# Patient Record
Sex: Male | Born: 1935 | Race: White | Hispanic: No | Marital: Married | Smoking: Former smoker
Health system: Southern US, Community
[De-identification: ages and names within clinical notes are randomized; demographics above are authoritative.]

## PROBLEM LIST (undated history)

## (undated) DIAGNOSIS — M1990 Unspecified osteoarthritis, unspecified site: Secondary | ICD-10-CM

## (undated) DIAGNOSIS — E1140 Type 2 diabetes mellitus with diabetic neuropathy, unspecified: Secondary | ICD-10-CM

## (undated) DIAGNOSIS — E1144 Type 2 diabetes mellitus with diabetic amyotrophy: Secondary | ICD-10-CM

## (undated) DIAGNOSIS — R011 Cardiac murmur, unspecified: Secondary | ICD-10-CM

## (undated) DIAGNOSIS — C801 Malignant (primary) neoplasm, unspecified: Secondary | ICD-10-CM

## (undated) DIAGNOSIS — G4730 Sleep apnea, unspecified: Secondary | ICD-10-CM

## (undated) DIAGNOSIS — M549 Dorsalgia, unspecified: Secondary | ICD-10-CM

## (undated) DIAGNOSIS — I10 Essential (primary) hypertension: Secondary | ICD-10-CM

## (undated) DIAGNOSIS — R2689 Other abnormalities of gait and mobility: Secondary | ICD-10-CM

## (undated) DIAGNOSIS — Z741 Need for assistance with personal care: Secondary | ICD-10-CM

## (undated) HISTORY — DX: Type 2 diabetes mellitus with diabetic neuropathy, unspecified: E11.40

## (undated) HISTORY — PX: VASECTOMY: SHX75

## (undated) HISTORY — DX: Dorsalgia, unspecified: M54.9

## (undated) HISTORY — PX: PROSTATE SURGERY: SHX751

## (undated) HISTORY — PX: CATARACT EXTRACTION: SUR2

## (undated) HISTORY — PX: CHOLECYSTECTOMY: SHX55

## (undated) HISTORY — DX: Type 2 diabetes mellitus with diabetic amyotrophy: E11.44

## (undated) HISTORY — PX: OTHER SURGICAL HISTORY: SHX169

---

## 2001-01-01 ENCOUNTER — Encounter: Payer: Self-pay | Admitting: Neurosurgery

## 2001-01-01 ENCOUNTER — Encounter: Admission: RE | Admit: 2001-01-01 | Discharge: 2001-01-01 | Payer: Self-pay | Admitting: Neurosurgery

## 2001-01-28 ENCOUNTER — Encounter: Payer: Self-pay | Admitting: Neurosurgery

## 2001-02-01 ENCOUNTER — Inpatient Hospital Stay (HOSPITAL_COMMUNITY): Admission: RE | Admit: 2001-02-01 | Discharge: 2001-02-03 | Payer: Self-pay | Admitting: Neurosurgery

## 2001-02-01 ENCOUNTER — Encounter: Payer: Self-pay | Admitting: Neurosurgery

## 2001-04-04 ENCOUNTER — Encounter (HOSPITAL_COMMUNITY): Admission: RE | Admit: 2001-04-04 | Discharge: 2001-05-04 | Payer: Self-pay | Admitting: Neurosurgery

## 2001-05-09 ENCOUNTER — Encounter (HOSPITAL_COMMUNITY): Admission: RE | Admit: 2001-05-09 | Discharge: 2001-06-08 | Payer: Self-pay | Admitting: Neurosurgery

## 2001-08-02 ENCOUNTER — Encounter: Admission: RE | Admit: 2001-08-02 | Discharge: 2001-08-02 | Payer: Self-pay | Admitting: Neurosurgery

## 2001-08-02 ENCOUNTER — Encounter: Payer: Self-pay | Admitting: Neurosurgery

## 2001-12-19 ENCOUNTER — Encounter: Payer: Self-pay | Admitting: Emergency Medicine

## 2001-12-19 ENCOUNTER — Emergency Department (HOSPITAL_COMMUNITY): Admission: EM | Admit: 2001-12-19 | Discharge: 2001-12-19 | Payer: Self-pay | Admitting: *Deleted

## 2002-04-03 ENCOUNTER — Encounter: Payer: Self-pay | Admitting: Neurosurgery

## 2002-04-03 ENCOUNTER — Encounter: Admission: RE | Admit: 2002-04-03 | Discharge: 2002-04-03 | Payer: Self-pay | Admitting: Neurosurgery

## 2002-04-17 ENCOUNTER — Encounter: Admission: RE | Admit: 2002-04-17 | Discharge: 2002-04-17 | Payer: Self-pay | Admitting: Neurosurgery

## 2002-04-17 ENCOUNTER — Encounter: Payer: Self-pay | Admitting: Neurosurgery

## 2002-05-02 ENCOUNTER — Encounter: Admission: RE | Admit: 2002-05-02 | Discharge: 2002-05-02 | Payer: Self-pay | Admitting: Neurosurgery

## 2002-05-02 ENCOUNTER — Encounter: Payer: Self-pay | Admitting: Neurosurgery

## 2003-01-05 ENCOUNTER — Encounter: Payer: Self-pay | Admitting: Internal Medicine

## 2003-01-05 ENCOUNTER — Ambulatory Visit (HOSPITAL_COMMUNITY): Admission: RE | Admit: 2003-01-05 | Discharge: 2003-01-05 | Payer: Self-pay | Admitting: Internal Medicine

## 2003-12-19 ENCOUNTER — Ambulatory Visit (HOSPITAL_COMMUNITY): Admission: RE | Admit: 2003-12-19 | Discharge: 2003-12-19 | Payer: Self-pay | Admitting: Internal Medicine

## 2004-03-31 ENCOUNTER — Ambulatory Visit (HOSPITAL_COMMUNITY): Admission: RE | Admit: 2004-03-31 | Discharge: 2004-03-31 | Payer: Self-pay | Admitting: Otolaryngology

## 2004-05-13 ENCOUNTER — Ambulatory Visit (HOSPITAL_COMMUNITY): Admission: RE | Admit: 2004-05-13 | Discharge: 2004-05-13 | Payer: Self-pay | Admitting: Urology

## 2004-07-03 ENCOUNTER — Ambulatory Visit (HOSPITAL_COMMUNITY): Admission: RE | Admit: 2004-07-03 | Discharge: 2004-07-03 | Payer: Self-pay | Admitting: Internal Medicine

## 2004-07-04 ENCOUNTER — Ambulatory Visit (HOSPITAL_COMMUNITY): Admission: RE | Admit: 2004-07-04 | Discharge: 2004-07-04 | Payer: Self-pay | Admitting: Internal Medicine

## 2004-11-06 ENCOUNTER — Ambulatory Visit (HOSPITAL_COMMUNITY): Admission: RE | Admit: 2004-11-06 | Discharge: 2004-11-06 | Payer: Self-pay | Admitting: Internal Medicine

## 2006-01-18 ENCOUNTER — Other Ambulatory Visit: Admission: RE | Admit: 2006-01-18 | Discharge: 2006-01-18 | Payer: Self-pay | Admitting: Urology

## 2006-02-24 ENCOUNTER — Ambulatory Visit: Admission: RE | Admit: 2006-02-24 | Discharge: 2006-05-25 | Payer: Self-pay | Admitting: Radiation Oncology

## 2006-02-25 ENCOUNTER — Encounter: Admission: RE | Admit: 2006-02-25 | Discharge: 2006-02-25 | Payer: Self-pay | Admitting: Urology

## 2006-04-19 ENCOUNTER — Ambulatory Visit (HOSPITAL_BASED_OUTPATIENT_CLINIC_OR_DEPARTMENT_OTHER): Admission: RE | Admit: 2006-04-19 | Discharge: 2006-04-19 | Payer: Self-pay | Admitting: Urology

## 2007-07-29 ENCOUNTER — Emergency Department (HOSPITAL_COMMUNITY): Admission: EM | Admit: 2007-07-29 | Discharge: 2007-07-29 | Payer: Self-pay | Admitting: Emergency Medicine

## 2007-11-14 ENCOUNTER — Ambulatory Visit (HOSPITAL_COMMUNITY): Admission: RE | Admit: 2007-11-14 | Discharge: 2007-11-14 | Payer: Self-pay | Admitting: Urology

## 2008-04-25 ENCOUNTER — Inpatient Hospital Stay (HOSPITAL_COMMUNITY): Admission: RE | Admit: 2008-04-25 | Discharge: 2008-04-27 | Payer: Self-pay | Admitting: Urology

## 2008-04-25 ENCOUNTER — Encounter (INDEPENDENT_AMBULATORY_CARE_PROVIDER_SITE_OTHER): Payer: Self-pay | Admitting: Urology

## 2009-03-12 ENCOUNTER — Emergency Department (HOSPITAL_COMMUNITY): Admission: EM | Admit: 2009-03-12 | Discharge: 2009-03-12 | Payer: Self-pay | Admitting: Emergency Medicine

## 2010-07-17 ENCOUNTER — Ambulatory Visit: Payer: Self-pay | Admitting: Psychology

## 2010-12-28 HISTORY — PX: TRANSURETHRAL RESECTION OF PROSTATE: SHX73

## 2011-01-17 ENCOUNTER — Emergency Department (HOSPITAL_COMMUNITY)
Admission: EM | Admit: 2011-01-17 | Discharge: 2011-01-17 | Payer: Self-pay | Source: Home / Self Care | Admitting: Emergency Medicine

## 2011-01-20 LAB — URINALYSIS, ROUTINE W REFLEX MICROSCOPIC
Bilirubin Urine: NEGATIVE
Ketones, ur: NEGATIVE mg/dL
Leukocytes, UA: NEGATIVE
Nitrite: NEGATIVE
Protein, ur: 30 mg/dL — AB
Specific Gravity, Urine: 1.02 (ref 1.005–1.030)
Urine Glucose, Fasting: 250 mg/dL — AB
Urobilinogen, UA: 0.2 mg/dL (ref 0.0–1.0)
pH: 7 (ref 5.0–8.0)

## 2011-01-20 LAB — DIFFERENTIAL
Basophils Absolute: 0 10*3/uL (ref 0.0–0.1)
Basophils Relative: 1 % (ref 0–1)
Eosinophils Absolute: 0.3 10*3/uL (ref 0.0–0.7)
Eosinophils Relative: 6 % — ABNORMAL HIGH (ref 0–5)
Lymphocytes Relative: 31 % (ref 12–46)
Lymphs Abs: 1.3 10*3/uL (ref 0.7–4.0)
Monocytes Absolute: 0.4 10*3/uL (ref 0.1–1.0)
Monocytes Relative: 11 % (ref 3–12)
Neutro Abs: 2.1 10*3/uL (ref 1.7–7.7)
Neutrophils Relative %: 51 % (ref 43–77)

## 2011-01-20 LAB — CBC
HCT: 38 % — ABNORMAL LOW (ref 39.0–52.0)
Hemoglobin: 13 g/dL (ref 13.0–17.0)
MCH: 30.9 pg (ref 26.0–34.0)
MCHC: 34.2 g/dL (ref 30.0–36.0)
MCV: 90.3 fL (ref 78.0–100.0)
Platelets: 209 10*3/uL (ref 150–400)
RBC: 4.21 MIL/uL — ABNORMAL LOW (ref 4.22–5.81)
RDW: 14 % (ref 11.5–15.5)
WBC: 4.1 10*3/uL (ref 4.0–10.5)

## 2011-01-20 LAB — BASIC METABOLIC PANEL
BUN: 25 mg/dL — ABNORMAL HIGH (ref 6–23)
CO2: 24 mEq/L (ref 19–32)
Calcium: 9.2 mg/dL (ref 8.4–10.5)
Chloride: 99 mEq/L (ref 96–112)
Creatinine, Ser: 1.99 mg/dL — ABNORMAL HIGH (ref 0.4–1.5)
GFR calc Af Amer: 40 mL/min — ABNORMAL LOW (ref >60)
GFR calc non Af Amer: 33 mL/min — ABNORMAL LOW (ref >60)
Glucose, Bld: 141 mg/dL — ABNORMAL HIGH (ref 70–99)
Potassium: 4.1 mEq/L (ref 3.5–5.1)
Sodium: 133 mEq/L — ABNORMAL LOW (ref 135–145)

## 2011-01-20 LAB — URINE MICROSCOPIC-ADD ON

## 2011-01-21 LAB — URINE CULTURE
Colony Count: NO GROWTH
Culture  Setup Time: 201201222122
Culture: NO GROWTH

## 2011-04-09 LAB — URINALYSIS, ROUTINE W REFLEX MICROSCOPIC
Bilirubin Urine: NEGATIVE
Glucose, UA: NEGATIVE mg/dL
Ketones, ur: NEGATIVE mg/dL
Leukocytes, UA: NEGATIVE
Nitrite: NEGATIVE
Protein, ur: NEGATIVE mg/dL
Specific Gravity, Urine: 1.015 (ref 1.005–1.030)
Urobilinogen, UA: 0.2 mg/dL (ref 0.0–1.0)
pH: 5.5 (ref 5.0–8.0)

## 2011-04-09 LAB — BASIC METABOLIC PANEL
BUN: 27 mg/dL — ABNORMAL HIGH (ref 6–23)
CO2: 28 mEq/L (ref 19–32)
Calcium: 9.4 mg/dL (ref 8.4–10.5)
Chloride: 107 mEq/L (ref 96–112)
Creatinine, Ser: 1.64 mg/dL — ABNORMAL HIGH (ref 0.4–1.5)
GFR calc Af Amer: 50 mL/min — ABNORMAL LOW (ref >60)
GFR calc non Af Amer: 42 mL/min — ABNORMAL LOW (ref >60)
Glucose, Bld: 141 mg/dL — ABNORMAL HIGH (ref 70–99)
Potassium: 5.2 mEq/L — ABNORMAL HIGH (ref 3.5–5.1)
Sodium: 141 mEq/L (ref 135–145)

## 2011-04-09 LAB — URINE MICROSCOPIC-ADD ON

## 2011-05-12 NOTE — H&P (Signed)
NAME:  Manuel [name], Manuel [name]                 ACCOUNT NO.:  [id number]   MEDICAL RECORD NO.:  [id number]          PATIENT TYPE:  AMB   LOCATION:  DAY                           FACILITY:  [facility]   PHYSICIAN:  Ky Barban, M.D.DATE OF BIRTH:  January 30, 1935   DATE OF ADMISSION:  04/24/2008  DATE OF DISCHARGE:  LH                              HISTORY & PHYSICAL   CHIEF COMPLAINT:  Significant symptoms of prostatism.   HISTORY OF PRESENT ILLNESS:  A 76 year old gentleman who has prostate  cancer, had seed implants done in April 2007.  Has done well, but over  the years he has developed symptoms of prostatism.  His flow rate is  average, flow rate is 5 mL per second.  He has been on Flomax, but there  is really not significant improvement.  He is quite miserable, wanted me  to go ahead and do something for that.  I have discussed with Dr.  Dayton Scrape, Radiotherapy, as he told me that he is almost two years post  radiotherapy and seed implant.  He should not have any and radiation  hazard.  I had cystoscope done of the small prostate, so I recommended  that we do a holmium laser ablation of his prostate.  I may have to do  with TUR prostate also at the same time.  I explained the procedure  limitations, complications in more detail.  He understands and wanted me  to go ahead and schedule.   PAST MEDICAL HISTORY:  1. It should be mentioned that he has been on Hytrin for his BPH for      long time.  I added Flomax, even both of them combined did not help      him.  2. Hypertension.  3. Non-insulin dependent diabetes.  4. History of kidney stones.   PAST SURGICAL HISTORY:  1. Surgery on his back in 2002.  2. Cholecystectomy in 1998.  3. Vasectomy in 1976.   FAMILY HISTORY:  His father had prostate cancer, but he died of heart  attack age 3.  Mother died of heart disease at age 87.  One sibling  died of lung cancer at age 71.   PERSONAL HISTORY:  He quit smoking 20 years ago he smoked about a  pack  for 20 years.  Does not drink alcohol.   REVIEW OF SYSTEMS:  Unremarkable.   PHYSICAL EXAMINATION:  VITAL SIGNS:  Blood pressure is 160/80.  CENTRAL NERVOUS SYSTEM:  No gross neurological deficit.  HEENT:  Negative.  CHEST:  Symmetrical.  HEART:  Regular sinus rhythm, no murmur.  ABDOMEN:  Soft, flat.  Liver, spleen, kidneys not palpable.  EXTERNAL GENITALIA:  Circumcised.  Meatus adequate.  Testicles are  normal.  RECTAL:  Normal sphincter tone.  No rectal mass.  Prostate 1-1/2+,  smooth and firm.   IMPRESSION:  1. Prostate cancer with bladder neck obstruction.  2. Non-insulin dependent diabetes.  3. Hypertension.   IMPRESSION:  Prostate cancer with bladder neck obstruction.   PLAN:  Holmium laser ablation of the prostate under anesthesia, then  keep him for  observation.  Admit him to the hospital.      Ky Barban, M.D.  Electronically Signed     MIJ/MEDQ  D:  04/24/2008  T:  04/25/2008  Job:  [id number]

## 2011-05-12 NOTE — Op Note (Signed)
NAME:  Manuel [name], Manuel [name]                 ACCOUNT NO.:  [id number]   MEDICAL RECORD NO.:  [id number]          PATIENT TYPE:  INP   LOCATION:  A312                          FACILITY:  [facility]   PHYSICIAN:  Ky Barban, M.D.DATE OF BIRTH:  10/14/1935   DATE OF PROCEDURE:  04/25/2008  DATE OF DISCHARGE:                               OPERATIVE REPORT   PREOPERATIVE DIAGNOSIS:  Carcinoma of the prostate with bladder neck  obstruction.   POSTOPERATIVE DIAGNOSIS:  Carcinoma of the prostate with bladder neck  obstruction.   PROCEDURE:  Holmium laser ablation of prostate plus transurethral  resection of prostate .   ANESTHESIA:  Spinal.   PROCEDURE:  The patient was given spinal anesthesia, placed in lithotomy  position, and usual prep and drape.  For laser ablation, I introduced  #26 resectoscope sheath.  Then, laser bridge was introduced and through  that, side-firing laser fiber was introduced.  The bladder was  thoroughly inspected.  He had a very fibrotic bladder neck and median  bar.  With the laser, I was able to ablate the bladder neck  circumferentially and also the lateral lobes, but there were lot of  irregular tissues still obstructing.  It was difficult to see the  obstructing tissue, so I decided to resect it so that it is smoothened  out.  The laser scope was removed, and I introduced #28 Iglesias  resectoscope in a typical fashion.  The bladder neck also was  circumferentially resected.  Now the bladder neck was smooth.  The  resectoscope goes in and outside the bladder neck without any  obstruction.  The lateral lobes of the obstructing tissue was resected,  especially near the apex.  There were lot of stones in the prostate near  the apex on the right side, which were renucleated from the prostatic  tissue.  Some of the radioactive seeds, which he had, hopefully they are  not radioactive any more, and they came out.  At the end, the prostatic  urethra and the bladder neck  were wide open.  Chips were evacuated.  Bleeders were coagulated.  Resectoscope was removed and #22 three-way  Foley catheter was left in for drainage.  Seepage started, which was  cleared.  The patient left the operating room in satisfactory condition.      Ky Barban, M.D.  Electronically Signed     MIJ/MEDQ  D:  04/25/2008  T:  04/26/2008  Job:  [id number]

## 2011-05-15 NOTE — H&P (Signed)
[place]. [facility]  Patient:    Manuel [name], Manuel [name]                    MRN: [id number] Adm. Date:  02/01/01 Attending:  Payton Doughty, M.D.                         History and Physical  ADMITTING DIAGNOSIS:  Herniated disk L4-5 extraforaminally on the left side.  SERVICE:  Neurosurgery.  HISTORY OF PRESENT ILLNESS:  This is a 76 year old right-handed white gentleman whom I had seen for 2-3 disk that was managed with epidural steroids in 1998.  He did well.  I then saw him earlier this month and he had pain in his back, down his left hip, and down the anterior aspect of his left knee. He has had a lot of discomfort down his knee.  Medrol Dosepack helped his pain but he has weakness and buckling of the left knee.  MRI showed an extraforaminal disk eccentric to the left at 4-5.  He did not want to do anything, but then called back two days later saying he could not live with the way his knee was and he wanted a diskectomy.  He is now admitted for extraforaminal diskectomy at L4-5 on the left.  MEDICAL HISTORY:  Remarkable for non-insulin-dependent diabetes.  MEDICATIONS: 1. Glucophage 2000 mg a day. 2. Amaryl 1 mg a day. 3. Terazosin 5 mg a day. 4. Neurontin 600 mg at bed. 5. Vicodin p.r.n. basis.  ALLERGIES:  None.  SURGICAL HISTORY:  Remarkable for no operations.  SOCIAL HISTORY:  He does not smoke or drink.  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  FAMILY HISTORY:  Mom and daddy have both died.  Mom had type 1 diabetes. Father passed away of a myocardial infarction two months ago.  REVIEW OF SYSTEMS:  Noncontributory for bladder dysfunction.  He does have back and leg pain.  PHYSICAL EXAMINATION:  HEENT:  Within normal limits.  NECK:  He has good range of motion of his neck.  CHEST:  Clear.  CARDIAC:  Regular rate and rhythm.  ABDOMEN:  Nontender with no hepatosplenomegaly.  EXTREMITIES:  Without  clubbing or cyanosis.  GENITOURINARY:  Deferred.  PERIPHERAL PULSES:  Good.  NEUROLOGIC:  He is awake, alert, oriented.  His cranial nerves are intact. Motor exam shows 5/5 strength throughout the upper and lower extremities save for the knee extensors of the left knee which are now 3/5.  He has an absent knee jerk on the left.  Dorsiflexors are full strength.  Plantar flexors are full strength.  Ankle jerk is full.  CLINICAL IMPRESSION:  Left L4 radiculopathy secondary to extraforaminal disk at 4-5 on the left side.  PLAN:  Lumbar laminectomy/diskectomy.  The risks and benefits of this approach have been discussed with him and he wished to proceed. DD:  02/01/01 TD:  02/01/01 Job: [id number]

## 2011-05-15 NOTE — Procedures (Signed)
NAME:  Manuel [name], Manuel [name]                           ACCOUNT NO.:  [id number]   MEDICAL RECORD NO.:  [id number]                   PATIENT TYPE:  OUT   LOCATION:  RESP                                 FACILITY:  [facility]   PHYSICIAN:  Edward L. Juanetta Gosling, M.D.             DATE OF BIRTH:  1935/07/31   DATE OF PROCEDURE:  DATE OF DISCHARGE:  07/03/2004                              PULMONARY FUNCTION TEST   SUBJECTIVE:  Spirometry shows a mild ventilatory defect and evidence of air  flow obstruction at the level of the small air ways.  This is consistent  with a 20-pack year smoking history.      ___________________________________________                                            Oneal Deputy Juanetta Gosling, M.D.   ELH/MEDQ  D:  07/07/2004  T:  07/07/2004  Job:  [id number]

## 2011-05-15 NOTE — Op Note (Signed)
NAME:  Manuel [name], Manuel [name]                 ACCOUNT NO.:  [id number]   MEDICAL RECORD NO.:  [id number]          PATIENT TYPE:  AMB   LOCATION:  NESC                         FACILITY:  [facility]   PHYSICIAN:  Lindaann Slough, M.D.  DATE OF BIRTH:  [birth year]   DATE OF PROCEDURE:  04/19/2006  DATE OF DISCHARGE:                                 OPERATIVE REPORT   PREOPERATIVE DIAGNOSES:  Carcinoma of prostate.   POSTOPERATIVE DIAGNOSIS:  Carcinoma of prostate.   PROCEDURE:  I-125 seeds implantation and cystoscopy.   SURGEON:  Dr. Brunilda Payor.   ANESTHESIA:  General.   INDICATIONS:  The patient is a 76 year old male who had a positive prostate  biopsy for adenocarcinoma of prostate. Gleason score is 6. PSA at the time  of diagnosis was 6.78. The patient chosen to have seeds implantation. He is  scheduled today for the procedure.   Under general anesthesia, the patient was prepped and draped and placed in  the dorsolithotomy position. The transducer was then inserted in the rectum  was inserted in the rectum, and the grid was attached to the transducer, and  ultrasound of the prostate was done and ultrasound planning was done by Dr.  Dayton Scrape. Two anchor needles were placed in the prostate. Then, after proper  planning under ultrasound guidance, a total of 23 needles were used to  insert 61 seeds in the prostate. The total radiation dose is 37.071  millicuries.   Then, the Foley catheter that was previously inserted in the bladder was  removed. Then, a flexible cystoscope was inserted in the bladder. The  anterior urethra is normal. He has moderate prostatic hypertrophy. The  bladder mucosa is normal. There is no stone or tumor in the bladder. The  ureteral orifices are in normal position and shape with clear  efflux. There is no evidence of seeds in the bladder. The cystoscope was  then removed. A #16 Foley catheter was then reinserted in the bladder.   The patient tolerated the procedure well and  left the OR in satisfactory  condition to post anesthesia care unit.      Lindaann Slough, M.D.  Electronically Signed     MN/MEDQ  D:  04/19/2006  T:  04/20/2006  Job:  [id number]   cc:   Maryln Gottron, M.D.  Fax: [phone]   Ky Barban, M.D.  Fax: [phone]

## 2011-05-15 NOTE — Op Note (Signed)
[place]. [facility]  Patient:    BARTON, WANT                        MRN: [id number] Proc. Date: 02/01/01 Adm. Date:  [id number] Attending:  Emeterio Reeve                           Operative Report  PREOPERATIVE DIAGNOSIS:  Herniated disk on the left side, extraforminally at L4-5.  POSTOPERATIVE DIAGNOSIS:  Herniated disk on the left side, extraforminally at L4-5.  OPERATIVE PROCEDURE:  L4-5 extraforaminal diskectomy.  SURGEON:  Payton Doughty, M.D.  ASSISTANT:  Tanya Nones. Jeral Fruit, M.D.  ANESTHESIA:  General endotracheal.  PREP:  Shave and prepped, scrubbed with alcohol wipe.  COMPLICATIONS:  None.  DESCRIPTION OF PROCEDURE:  This is a 76 year old right-handed, white gentleman with a left L4 radiculopathy secondary to a herniated disk in the foramen at L4-5.  He was taken to the operating room, smoothly anesthetized, intubated and placed prone on the operating table.  Following shave, prepped and draped in usual sterile fashion.  Skin was infiltrated with 1% lidocaine with 1:400,000 epinephrine.  Skin was incised from the bottom of L3 to the bottom of L5 and the lamina of L4 and L5 were exposed on the left side out over the transverse process of both L4 and L5.  Intraoperative x-ray confirmed correctness of level.  The marker was under L5.  Working between the transverse processes, muscle was taken down.  The intertransverse membrane was taken down.  Working medially towards the L4 neural foramen, the four root was identified as it rounded the four pedicle and dissected free laterally over the 4-5 disk space.  The nerve was dissected free and underneath it was immediately demonstrated a large free fragment of disk, which was removed without difficulty. Having decompressed the nerve root, it was carefully explored in all quadrants and found to be free.  The disk space was not explored because of the tightness of the nerve passing over and concerns  about compression on the nerve.  The wound was irrigated and the nerve root covered with Depo Medrol-soaked fat.  The fascia was reapproximated with 0 Vicryl in interrupted fashion, subcutaneous tissues were reapproximated with 0 Vicryl in interrupted fashion, the subcuticular tissues were reapproximated with 3-0 Vicryl interrupted fashion and the skin was closed with 3-0 nylon in a running lock fashion.  Betadine, Telfa dressing was applied and made occlusive with OpSite.  The patient returned recovery room in good condition. DD:  02/01/01 TD:  02/01/01 Job: [id number]

## 2011-05-15 NOTE — Discharge Summary (Signed)
NAME:  CHI, GARLOW                 ACCOUNT NO.:  [id number]   MEDICAL RECORD NO.:  [id number]          PATIENT TYPE:  INP   LOCATION:  A312                          FACILITY:  [facility]   PHYSICIAN:  Ky Barban, M.D.DATE OF BIRTH:  1935-09-09   DATE OF ADMISSION:  04/24/2008  DATE OF DISCHARGE:  05/01/2009LH                               DISCHARGE SUMMARY   HISTORY OF PRESENT ILLNESS:  A 76 year old gentleman has a longstanding  history of prostatism and he had prostate cancer and underwent  radioactive seed implant done 2 years ago.  Workup shows that he has a  bladder neck obstruction.  He was not responding to Flomax, so I advised  him to undergo TUR of prostate.  His other medical problems include  hypertension, diabetes non-insulin dependent, and history of kidney  stones.  He had surgery on his back in 2002, cholecystectomy in 1998,  and vasectomy in 1996.  His father had prostate cancer, died of heart  attack at age 65.  This gentleman was brought to the outpatient.  CBC,  urinalysis, SMA-7 is normal.  He was taken to the operating room.  TUR  of prostate is done.  Postoperative course is benign.  First  postoperative day, urine is clear.  CBI was discontinued, and he was  started on regular diet.  On second postoperative day, urine is clear,  discontinued his Foley catheter, he is voiding fine.  He is being  discharged.  He will be followed by me in the office.   DISCHARGE DIAGNOSIS:  Prostate cancer.   DISCHARGE CONDITION:  Improved.   DISCHARGE MEDICATIONS:  He is advised to continue his usual medicines.  I will see him back in 2 weeks in the office.      Ky Barban, M.D.  Electronically Signed     MIJ/MEDQ  D:  05/18/2008  T:  05/19/2008  Job:  [id number]

## 2011-05-15 NOTE — Op Note (Signed)
NAME:  Manuel [name], Manuel [name]                           ACCOUNT NO.:  [id number]   MEDICAL RECORD NO.:  [id number]                   PATIENT TYPE:  AMB   LOCATION:  DAY                                  FACILITY:  [facility]   PHYSICIAN:  Lionel December, M.D.                 DATE OF BIRTH:  Dec 08, 1935   DATE OF PROCEDURE:  12/19/2003  DATE OF DISCHARGE:                                 OPERATIVE REPORT   PROCEDURE:  Total colonoscopy with polypectomy.   ENDOSCOPIST:  Lionel December, M.D.   INDICATIONS:  Skip is a 76 year old Caucasian male who is undergoing  screening colonoscopy.  Family history is negative for colorectal carcinoma.   The procedure and risks were reviewed with the patient and informed consent  was obtained.   PREOPERATIVE MEDICATIONS:  Demerol 50 mg IV and Versed 6 mg IV in divided  dose.   FINDINGS:  Procedure performed in endoscopy suite.  The patient's vital  signs and O2 saturation were monitored during the procedure and remained  stable.  The patient was placed in the left lateral recumbent position and  rectal examination was performed.  No abnormality noted on external or  digital exam.   Olympus videoscope was placed in the rectum and advanced under vision into  the sigmoid colon and beyond. Scope was passed to the cecum which was  identified by ileocecal valve and appendiceal orifice. A picture of the IL  was taken but I did not take a picture of the blunt end, but it was well  seen.  As the scope was withdrawn the colonic mucosa was carefully examined.  There were a few small diverticula at the sigmoid colon.  There was a large  pedunculated polyp at the sigmoid colon on a fairly long stalk which was  about 2 cm long.  The polyp size was 15-20 mm.  This was snared and  retrieved for histologic examination.  There was another small polyp at the  rectosigmoid junction at about 6-7 mm. This was snared and retrieved for  histological examination.  Two tiny polyps were  noted in the rectum which  were coagulated and completely ablated.  The scope was retroflexed to  examine the anorectal junction and small hemorrhoids were noted below the  dentate line.   The endoscope was straightened and withdrawn.  The patient tolerated the  procedure well.   FINAL DIAGNOSES:  1. Two polyps snared, a large pedunculated polyp was at sigmoid colon and a     smaller polyp at the rectosigmoid junction.  Two more tiny polyps at the     rectum were coagulated.  2. A few diverticula at the sigmoid colon and small external hemorrhoids.   RECOMMENDATIONS:  1. Standard instructions given.  2. High fiber diet.  3. I will be contacting the patient with biopsy results.  Given today's     findings, I feel, he  should return for repeat colonoscopy 5 years from     now.      ___________________________________________                                            Lionel December, M.D.   NR/MEDQ  D:  12/19/2003  T:  12/20/2003  Job:  [id number]   cc:   Kingsley Callander. Ouida Sills, M.D.  [address]  [place]  [place] [address]  Fax: [phone]

## 2011-06-24 ENCOUNTER — Other Ambulatory Visit (HOSPITAL_COMMUNITY): Payer: Self-pay | Admitting: Internal Medicine

## 2011-06-24 DIAGNOSIS — R0989 Other specified symptoms and signs involving the circulatory and respiratory systems: Secondary | ICD-10-CM

## 2011-06-26 ENCOUNTER — Ambulatory Visit (HOSPITAL_COMMUNITY)
Admission: RE | Admit: 2011-06-26 | Discharge: 2011-06-26 | Disposition: A | Discharge status: Home / Self Care | Payer: Medicare Other | Source: Ambulatory Visit | Attending: Internal Medicine | Admitting: Internal Medicine

## 2011-06-26 DIAGNOSIS — R0989 Other specified symptoms and signs involving the circulatory and respiratory systems: Secondary | ICD-10-CM

## 2011-06-26 DIAGNOSIS — E119 Type 2 diabetes mellitus without complications: Secondary | ICD-10-CM | POA: Insufficient documentation

## 2011-06-26 DIAGNOSIS — I6529 Occlusion and stenosis of unspecified carotid artery: Secondary | ICD-10-CM | POA: Insufficient documentation

## 2011-07-03 ENCOUNTER — Other Ambulatory Visit: Payer: Self-pay | Admitting: Neurosurgery

## 2011-07-03 DIAGNOSIS — M545 Low back pain, unspecified: Secondary | ICD-10-CM

## 2011-07-13 ENCOUNTER — Ambulatory Visit
Admission: RE | Admit: 2011-07-13 | Discharge: 2011-07-13 | Disposition: A | Discharge status: Home / Self Care | Payer: Medicare Other | Source: Ambulatory Visit | Attending: Neurosurgery | Admitting: Neurosurgery

## 2011-07-13 DIAGNOSIS — M545 Low back pain, unspecified: Secondary | ICD-10-CM

## 2011-07-17 ENCOUNTER — Other Ambulatory Visit: Payer: Self-pay | Admitting: Neurosurgery

## 2011-07-17 DIAGNOSIS — M79604 Pain in right leg: Secondary | ICD-10-CM

## 2011-07-17 DIAGNOSIS — M545 Low back pain: Secondary | ICD-10-CM

## 2011-07-17 DIAGNOSIS — M47816 Spondylosis without myelopathy or radiculopathy, lumbar region: Secondary | ICD-10-CM

## 2011-07-17 DIAGNOSIS — M25552 Pain in left hip: Secondary | ICD-10-CM

## 2011-07-20 ENCOUNTER — Other Ambulatory Visit: Payer: Medicare Other

## 2011-07-20 ENCOUNTER — Ambulatory Visit
Admission: RE | Admit: 2011-07-20 | Discharge: 2011-07-20 | Disposition: A | Discharge status: Home / Self Care | Payer: Medicare Other | Source: Ambulatory Visit | Attending: Neurosurgery | Admitting: Neurosurgery

## 2011-07-20 DIAGNOSIS — M79604 Pain in right leg: Secondary | ICD-10-CM

## 2011-07-20 DIAGNOSIS — M47816 Spondylosis without myelopathy or radiculopathy, lumbar region: Secondary | ICD-10-CM

## 2011-07-20 DIAGNOSIS — M545 Low back pain: Secondary | ICD-10-CM

## 2011-07-20 DIAGNOSIS — M25552 Pain in left hip: Secondary | ICD-10-CM

## 2011-07-20 MED ORDER — IOHEXOL 180 MG/ML  SOLN
1.0000 mL | Freq: Once | INTRAMUSCULAR | Status: AC | PRN
  Administered 2011-07-20: 1 mL via EPIDURAL

## 2011-07-20 MED ORDER — METHYLPREDNISOLONE ACETATE 40 MG/ML INJ SUSP (RADIOLOG
120.0000 mg | Freq: Once | INTRAMUSCULAR | Status: AC
  Administered 2011-07-20: 120 mg via EPIDURAL

## 2011-08-03 ENCOUNTER — Other Ambulatory Visit: Payer: Self-pay

## 2011-08-03 ENCOUNTER — Encounter (HOSPITAL_COMMUNITY): Payer: Self-pay

## 2011-08-03 ENCOUNTER — Inpatient Hospital Stay (HOSPITAL_COMMUNITY)
Admission: AD | Admit: 2011-08-03 | Discharge: 2011-08-08 | DRG: 683 | Disposition: A | Discharge status: Home / Self Care | Payer: Medicare Other | Source: Ambulatory Visit | Attending: Internal Medicine | Admitting: Internal Medicine

## 2011-08-03 ENCOUNTER — Inpatient Hospital Stay (HOSPITAL_COMMUNITY): Payer: Medicare Other

## 2011-08-03 DIAGNOSIS — E119 Type 2 diabetes mellitus without complications: Secondary | ICD-10-CM | POA: Diagnosis present

## 2011-08-03 DIAGNOSIS — N390 Urinary tract infection, site not specified: Secondary | ICD-10-CM | POA: Diagnosis present

## 2011-08-03 DIAGNOSIS — E875 Hyperkalemia: Secondary | ICD-10-CM | POA: Diagnosis present

## 2011-08-03 DIAGNOSIS — N179 Acute kidney failure, unspecified: Secondary | ICD-10-CM | POA: Diagnosis present

## 2011-08-03 DIAGNOSIS — E785 Hyperlipidemia, unspecified: Secondary | ICD-10-CM | POA: Diagnosis present

## 2011-08-03 DIAGNOSIS — N400 Enlarged prostate without lower urinary tract symptoms: Secondary | ICD-10-CM | POA: Diagnosis present

## 2011-08-03 DIAGNOSIS — I10 Essential (primary) hypertension: Secondary | ICD-10-CM | POA: Diagnosis present

## 2011-08-03 DIAGNOSIS — E118 Type 2 diabetes mellitus with unspecified complications: Secondary | ICD-10-CM | POA: Diagnosis present

## 2011-08-03 DIAGNOSIS — L255 Unspecified contact dermatitis due to plants, except food: Secondary | ICD-10-CM | POA: Diagnosis present

## 2011-08-03 DIAGNOSIS — N183 Chronic kidney disease, stage 3 unspecified: Secondary | ICD-10-CM | POA: Diagnosis present

## 2011-08-03 DIAGNOSIS — N189 Chronic kidney disease, unspecified: Secondary | ICD-10-CM | POA: Diagnosis present

## 2011-08-03 DIAGNOSIS — E872 Acidosis, unspecified: Secondary | ICD-10-CM | POA: Diagnosis present

## 2011-08-03 DIAGNOSIS — M519 Unspecified thoracic, thoracolumbar and lumbosacral intervertebral disc disorder: Secondary | ICD-10-CM | POA: Diagnosis present

## 2011-08-03 DIAGNOSIS — I129 Hypertensive chronic kidney disease with stage 1 through stage 4 chronic kidney disease, or unspecified chronic kidney disease: Secondary | ICD-10-CM | POA: Diagnosis present

## 2011-08-03 DIAGNOSIS — C61 Malignant neoplasm of prostate: Secondary | ICD-10-CM | POA: Diagnosis present

## 2011-08-03 DIAGNOSIS — T622X1A Toxic effect of other ingested (parts of) plant(s), accidental (unintentional), initial encounter: Secondary | ICD-10-CM | POA: Diagnosis present

## 2011-08-03 HISTORY — DX: Malignant (primary) neoplasm, unspecified: C80.1

## 2011-08-03 HISTORY — DX: Essential (primary) hypertension: I10

## 2011-08-03 MED ORDER — INSULIN ASPART 100 UNIT/ML ~~LOC~~ SOLN: 0.0000 [IU] | Freq: Every day | SUBCUTANEOUS | Status: DC

## 2011-08-03 MED ORDER — ACETAMINOPHEN 325 MG PO TABS: 650.0000 mg | ORAL_TABLET | Freq: Four times a day (QID) | ORAL | Status: DC | PRN

## 2011-08-03 MED ORDER — INSULIN ASPART 100 UNIT/ML ~~LOC~~ SOLN: 0.0000 [IU] | SUBCUTANEOUS | Status: DC

## 2011-08-03 MED ORDER — ACETAMINOPHEN 650 MG RE SUPP: 650.0000 mg | Freq: Four times a day (QID) | RECTAL | Status: DC | PRN

## 2011-08-03 MED ORDER — ONDANSETRON HCL 4 MG/2ML IJ SOLN: 4.0000 mg | Freq: Four times a day (QID) | INTRAMUSCULAR | Status: DC | PRN

## 2011-08-03 MED ORDER — SODIUM CHLORIDE 0.9 % IJ SOLN
INTRAMUSCULAR | Status: AC
  Administered 2011-08-03: 3 mL via INTRAVENOUS
  Filled 2011-08-03: qty 10

## 2011-08-03 MED ORDER — ALUM & MAG HYDROXIDE-SIMETH 200-200-20 MG/5ML PO SUSP: 30.0000 mL | Freq: Four times a day (QID) | ORAL | Status: DC | PRN

## 2011-08-03 MED ORDER — SODIUM CHLORIDE 0.9 % IJ SOLN
3.0000 mL | INTRAMUSCULAR | Status: DC | PRN
  Administered 2011-08-03: 10 mL via INTRAVENOUS

## 2011-08-03 MED ORDER — SODIUM CHLORIDE 0.9 % IV SOLN: 250.0000 mL | INTRAVENOUS | Status: DC

## 2011-08-03 MED ORDER — SODIUM CHLORIDE 0.9 % IJ SOLN
3.0000 mL | Freq: Two times a day (BID) | INTRAMUSCULAR | Status: DC
  Administered 2011-08-03: 3 mL via INTRAVENOUS

## 2011-08-03 MED ORDER — SODIUM POLYSTYRENE SULFONATE 15 GM/60ML PO SUSP
30.0000 g | Freq: Once | ORAL | Status: AC
  Administered 2011-08-03: 30 g via ORAL
  Filled 2011-08-03 (×2): qty 60

## 2011-08-03 MED ORDER — ONDANSETRON HCL 4 MG PO TABS: 4.0000 mg | ORAL_TABLET | Freq: Four times a day (QID) | ORAL | Status: DC | PRN

## 2011-08-03 MED ORDER — SODIUM CHLORIDE 0.9 % IV SOLN
INTRAVENOUS | Status: DC
  Administered 2011-08-03 – 2011-08-04 (×4): via INTRAVENOUS
  Administered 2011-08-05: 950 mL via INTRAVENOUS
  Administered 2011-08-05: 1000 mL via INTRAVENOUS
  Administered 2011-08-05: 950 mL via INTRAVENOUS
  Administered 2011-08-06: 1000 mL via INTRAVENOUS
  Administered 2011-08-06: 175 mL via INTRAVENOUS
  Administered 2011-08-06: 1000 mL via INTRAVENOUS
  Administered 2011-08-07: 125 mL via INTRAVENOUS
  Administered 2011-08-07: 1000 mL via INTRAVENOUS
  Administered 2011-08-07 – 2011-08-08 (×2): via INTRAVENOUS

## 2011-08-03 NOTE — Plan of Care (Signed)
Problem: Phase I Progression Outcomes Goal: Pain controlled with appropriate interventions Outcome: Not Applicable Date Met:  08/03/11 No c/o pain

## 2011-08-04 LAB — GLUCOSE, CAPILLARY
Glucose-Capillary: 94 mg/dL (ref 70–99)
Glucose-Capillary: 66 mg/dL — ABNORMAL LOW (ref 70–99)
Glucose-Capillary: 178 mg/dL — ABNORMAL HIGH (ref 70–99)

## 2011-08-04 LAB — BASIC METABOLIC PANEL
BUN: 90 mg/dL — ABNORMAL HIGH (ref 6–23)
Calcium: 8.8 mg/dL (ref 8.4–10.5)
Creatinine, Ser: 8.33 mg/dL — ABNORMAL HIGH (ref 0.50–1.35)
GFR calc non Af Amer: 6 mL/min — ABNORMAL LOW (ref >60)
Glucose, Bld: 61 mg/dL — ABNORMAL LOW (ref 70–99)

## 2011-08-04 MED ORDER — HYDROXYZINE HCL 25 MG PO TABS
25.0000 mg | ORAL_TABLET | ORAL | Status: DC | PRN
  Administered 2011-08-04 – 2011-08-06 (×2): 25 mg via ORAL
  Filled 2011-08-04 (×2): qty 1

## 2011-08-04 MED ORDER — INSULIN ASPART 100 UNIT/ML ~~LOC~~ SOLN: 0.0000 [IU] | Freq: Every day | SUBCUTANEOUS | Status: DC

## 2011-08-04 MED ORDER — INSULIN ASPART 100 UNIT/ML ~~LOC~~ SOLN: 0.0000 [IU] | SUBCUTANEOUS | Status: DC

## 2011-08-04 MED ORDER — INSULIN ASPART 100 UNIT/ML ~~LOC~~ SOLN: 0.0000 [IU] | Freq: Three times a day (TID) | SUBCUTANEOUS | Status: DC

## 2011-08-04 MED ORDER — INSULIN ASPART 100 UNIT/ML ~~LOC~~ SOLN
0.0000 [IU] | Freq: Three times a day (TID) | SUBCUTANEOUS | Status: DC
  Administered 2011-08-05 – 2011-08-06 (×2): 3 [IU] via SUBCUTANEOUS
  Administered 2011-08-07: 2 [IU] via SUBCUTANEOUS
  Administered 2011-08-07: 3 [IU] via SUBCUTANEOUS
  Filled 2011-08-04: qty 3

## 2011-08-04 MED ORDER — INSULIN ASPART 100 UNIT/ML ~~LOC~~ SOLN: 1.0000 [IU] | Freq: Three times a day (TID) | SUBCUTANEOUS | Status: DC

## 2011-08-04 MED ORDER — FUROSEMIDE 10 MG/ML IJ SOLN
100.0000 mg | Freq: Two times a day (BID) | INTRAVENOUS | Status: DC
  Administered 2011-08-04 – 2011-08-05 (×2): 100 mg via INTRAVENOUS
  Filled 2011-08-04 (×4): qty 10

## 2011-08-04 NOTE — Progress Notes (Signed)
NAME:  LIEV, BROCKBANK                 ACCOUNT NO.:  [id number]  MEDICAL RECORD NO.:  [id number]  LOCATION:  A334                          FACILITY:  [facility]  PHYSICIAN:  Kingsley Callander. Ouida Sills, MD       DATE OF BIRTH:  18-Jun-1935  DATE OF PROCEDURE: DATE OF DISCHARGE:                                PROGRESS NOTE   Mr. Cafiero has had no problems overnight.  He was hypoglycemic this morning to 49 on his Accu-Chek.  PHYSICAL EXAMINATION:  VITAL SIGNS:  His temperature is 97.8, pulse 56, respirations 18, blood pressure 103/57. GENERAL:  He is alert and oriented. LUNGS:  Clear. HEART:  Regular with no murmurs. ABDOMEN:  Soft and nontender.  His EKG last night revealed sinus bradycardia with no tall peaked T- waves.  IMPRESSION/PLAN: 1. Acute renal failure.  His BUN and creatinine today are 90 and 8.3     after having been 88 and 8.0 yesterday.  His IV fluid rate will be     increased to 175 mL an hour.  He will be seen in Nephrology     consultation by Dr. Kristian Covey.  We had discussed his case.  He had     no evidence of obstruction on a bladder scan or on a CT scan of the     abdomen and pelvis yesterday.  He did not have evidence of     hydronephrosis.  His CK was elevated yesterday at 525.  A possible     explanation may be related to heat exposure last week.  His CK may     be down from a significant elevation several days ago. 2. Hyperkalemia.  Serum potassium has normalized from 6.6 to 5.1 after     fluids and Kayexalate. 3. Diabetes with hypoglycemia.  His glucose was 61 on his BMET.  He     has been treated with juice.  His oral hypoglycemics are being     held. 4. History of hypertension. 5. Possible urinary tract infection.  Urinalysis reveals 7-10 white     cells and 3-6 RBCs.  His urine culture is pending.     Kingsley Callander. Ouida Sills, MD     ROF/MEDQ  D:  08/04/2011  T:  08/04/2011  Job:  [id number]

## 2011-08-04 NOTE — Consult Note (Signed)
Reason for Consult: Acute kidney injury superimposed on chronic Referring Physician: Dr. Hulan [name] is an 76 y.o. male.  HPI: The patient has history of diabetes for a long period and history of chronic renal failure thought to be secondary to obstructive uropathy with baseline creatinine about 1.8 1.9 Presently the patient came with complaints of weakness and the exertion after working outside. According to the patient on Saturday he started having some weakness of his leg but improved after a while now. The next morning he was still working outside he had a similar episode of weakness feeling tired. Because of that he went to Dr. Ouida [name] and will have blood work was done and that was found to have significantly elevated creatinine creatinine. Patient denies any nausea no vomiting he denies also any shortness of breath orthopnea paroxysmal nocturnal dyspnea. Patient also denies any history of a fever or chills or sweating.  Past Medical History  Diagnosis Date  . Hypertension   . Diabetes mellitus   . Cancer     Past Surgical History  Procedure Date  . Back surgery   . Prostate surgery     with seed implants  . Cholecystectomy     No family history on file.  Social History:  reports that he has never smoked. He does not have any smokeless tobacco history on file. He reports that he does not drink alcohol or use illicit drugs.  Allergies: No Known Allergies  Medications: I have reviewed the patient'[name] current medications.  Results for orders placed during the hospital encounter of 08/03/11 (from the past 48 hour([name]))  GLUCOSE, CAPILLARY     Status: Abnormal   Collection Time   08/03/11  8:12 PM      Component Value Range Comment   Glucose-Capillary 63 (*) 70 - 99 (mg/dL)   GLUCOSE, CAPILLARY     Status: Abnormal   Collection Time   08/03/11 11:46 PM      Component Value Range Comment   Glucose-Capillary 111 (*) 70 - 99 (mg/dL)   GLUCOSE, CAPILLARY     Status: Normal   Collection Time   08/04/11  3:54 AM      Component Value Range Comment   Glucose-Capillary 94  70 - 99 (mg/dL)   BASIC METABOLIC PANEL     Status: Abnormal   Collection Time   08/04/11  5:27 AM      Component Value Range Comment   Sodium 137  135 - 145 (mEq/L)    Potassium 5.1  3.5 - 5.1 (mEq/L)    Chloride 99  96 - 112 (mEq/L)    CO2 19  19 - 32 (mEq/L)    Glucose, Bld 61 (*) 70 - 99 (mg/dL)    BUN 90 (*) 6 - 23 (mg/dL)    Creatinine, Ser 1.61 (*) 0.50 - 1.35 (mg/dL)    Calcium 8.8  8.4 - 10.5 (mg/dL)    GFR calc non Af Amer 6 (*) >60 (mL/min)    GFR calc Af Amer 8 (*) >60 (mL/min)   PHOSPHORUS     Status: Abnormal   Collection Time   08/04/11  5:27 AM      Component Value Range Comment   Phosphorus 6.7 (*) 2.3 - 4.6 (mg/dL)   MAGNESIUM     Status: Normal   Collection Time   08/04/11  5:27 AM      Component Value Range Comment   Magnesium 2.5  1.5 - 2.5 (mg/dL)   GLUCOSE,  CAPILLARY     Status: Abnormal   Collection Time   08/04/11  7:36 AM      Component Value Range Comment   Glucose-Capillary 49 (*) 70 - 99 (mg/dL)   GLUCOSE, CAPILLARY     Status: Normal   Collection Time   08/04/11  8:07 AM      Component Value Range Comment   Glucose-Capillary 83  70 - 99 (mg/dL)     Ct Abdomen Pelvis Wo Contrast  08/03/2011  *RADIOLOGY REPORT*  Clinical Data: Acute renal failure, evaluate for obstruction, history of prostate cancer  CT ABDOMEN AND PELVIS WITHOUT CONTRAST  Technique:  Multidetector CT imaging of the abdomen and pelvis was performed following the standard protocol without intravenous contrast.  Comparison: 11/14/2007  Findings: Calcified pleural plaques at the lung bases.  Unenhanced liver, spleen, pancreas, and adrenal glands are within normal limits.  Status post cholecystectomy.  No intrahepatic or extrahepatic ductal dilatation.  Bilateral renal scarring.  3 mm nonobstructing calculus in the left lower pole (series 3/image 42).  No hydronephrosis.  No evidence of bowel obstruction.   Normal appendix.  Colonic diverticulosis, without associated inflammatory changes.  Atherosclerotic calcifications of the abdominal aorta and branch vessels.  No abdominopelvic ascites.  No suspicious lymphadenopathy.  Prostate is notable for brachytherapy seeds.  Bladder is thick-walled but underdistended.  Degenerative changes of the visualized thoracolumbar spine.  No focal osseous lesions.  IMPRESSION: 3 mm nonobstructing left lower pole calculus.  No hydronephrosis.  Bladder is thick-walled but underdistended.  Brachytherapy seeds in the prostate.  No evidence of metastatic disease in the abdomen/pelvis.  Original Report Authenticated By: Manuel [name], M.D.    Review of Systems  Constitutional: Negative for fever and chills.  Respiratory: Negative for cough.   Cardiovascular: Negative for chest pain, orthopnea and PND.  Gastrointestinal: Negative for nausea, vomiting and abdominal pain.  Genitourinary: Positive for frequency.  Musculoskeletal: Positive for myalgias and joint pain.  Skin: Positive for itching.  Neurological: Positive for tremors.   Blood pressure 103/57, pulse 56, temperature 97.8 F (36.6 C), temperature source Oral, resp. rate 18, height 6' 1.5" (1.867 m), weight 117.663 kg (259 lb 6.4 oz), SpO2 97.00%. Physical Exam  Constitutional: He is oriented to person, place, and time.  Eyes: Conjunctivae are normal. Pupils are equal, round, and reactive to light.  Neck: Neck supple.  Cardiovascular: Normal rate, regular rhythm, normal heart sounds and intact distal pulses.  Exam reveals no gallop.   No murmur heard. Respiratory: No respiratory distress. He has no wheezes. He has no rales.  GI: He exhibits distension. There is no tenderness. There is no rebound.  Musculoskeletal: He exhibits no edema.  Neurological: He is oriented to person, place, and time.  Skin: Rash noted.    Assessment/Plan Problem #1 acute kidney injury her as this moment the possibly her prerenal  syndrome versus ATN her since the patient has history of her muscle pain elevated pending creatinine hyperkalemia her as this moment rhabdomyolysis need to be added into the differential diagnosis however his CPK has seems to be only mildly elevated. Problem #2 Hyperkalemia he status post thekyoxalated presently his potassium has come down to 5.1. Problem #3 history of diabetes Problem number4 for her history of proteinuria in nephrotic range 2 to be secondary to her diabetes patient has been on ACE inhibitoro and a couple of years. Problem #5 a history of prostate ca status post radiation Problem #6 history of obstructive uropathy secondary to BPH with the  present one her function of probably a stage II or early stage III chronic renal failure. Problem #7 history of hypertension and. Recommendation we'll check her history today We'll increase his IV fluids to 150 cc per hour I once the patient is adequately hydrated possibly after he received about 2 L will give him Lasix 100 mg IV. If UA showed significant proteinuria probably would not work up her hypoproteinemia. We'll check her CBC be BMET phosphorus he'[name] the morning. I will follow patient. Is end of consult thank you   Manuel [name] 08/04/2011, 10:30 AM

## 2011-08-04 NOTE — H&P (Signed)
NAME:  Manuel [name], Manuel [name]                 ACCOUNT NO.:  [id number]  MEDICAL RECORD NO.:  [id number]  LOCATION:  A334                          FACILITY:  [facility]  PHYSICIAN:  Kingsley Callander. Ouida Sills, MD       DATE OF BIRTH:  11/07/1935  DATE OF ADMISSION:  08/03/2011 DATE OF DISCHARGE:  LH                             HISTORY & PHYSICAL   CHIEF COMPLAINT:  Generalized weakness and leg cramps.  HISTORY OF PRESENT ILLNESS:  This patient is a 76 year old white male who presented with episodes of weakness and leg cramping over the past week.  He was evaluated in the office today and found to be in acute renal failure with a BUN and creatinine of 88 and 8.0.  His previous creatinine had been in the 1.8-2.0 range.  He is also hyperkalemic at 6.6.  The patient has a history of prostate cancer treated with radioactive seeds and TURP.  He had an episode of urinary retention last January, requiring emergency room visit.  He has recently had difficulty voiding and emptying his bladder effectively.  He denied gross hematuria.  His urinalysis revealed 7-10 white cells and 3-6 red cells. It is also noteworthy that he had 2 episodes last week of significant weakness after trying to work outdoors, he is outdoor in the heat on Saturday for about 5 hours.  He became much weaker then and felt the cramping in his legs and felt some involuntary jerking of his arms.  PAST MEDICAL HISTORY: 1. Prostate cancer, treated with radioactive seeds. 2. History of benign prostatic hypertrophy with urinary retention. 3. Type 2 diabetes. 4. Chronic kidney disease. 5. Microalbuminuria. 6. Hypertension. 7. Lumbar disk disease. 8. History of memory loss.  MEDICATIONS: 1. Duetact 2/30 mg daily. 2. Metformin 1000 mg b.i.d. 3. Enalapril 10 mg daily. 4. Amlodipine 5 mg daily. 5. Gabapentin 600 mg t.i.d. 6. Fenofibrate 160 mg daily. 7. Exelon 3 mg b.i.d. 8. Aspirin 81 mg daily. 9. Fish oil 1200 mg t.i.d.  ALLERGIES:  None.  SOCIAL  HISTORY:  He does not smoke cigarettes, drink alcohol, or use recreational substances.  FAMILY HISTORY:  Remarkable for diabetes in his mother and grandmother.  REVIEW OF SYSTEMS:  He denies fever, chills, syncope, chest pain, difficulty breathing, or vomiting.  Did have 1 loose stool today.  PHYSICAL EXAMINATION:  VITAL SIGNS:  Blood pressure 110/68, pulse 76, weight 250. GENERAL:  Alert, emotional but in no acute distress. HEENT:  Eyes, nose, and oropharynx unremarkable. NECK:  Supple with no JVD or thyromegaly.  He has bilateral carotid bruits and he has recently had an ultrasound which revealed no hemodynamically significant stenosis. LUNGS:  Clear. HEART:  Regular with no murmurs. ABDOMEN:  Soft and nontender with no hepatosplenomegaly.  No CVA tenderness. EXTREMITIES:  No cyanosis, clubbing, or edema. NEUROLOGIC:  He does have some intermittent involuntary jerking of his upper extremities. LYMPH NODES:  No cervical or supraclavicular adenopathy. SKIN:  Warm and dry.  LABORATORY DATA:  White count 54,00, hemoglobin 11.7, with an MCV of 93, platelets 344,000.  Sodium 137, potassium 6.6, bicarb 20, glucose 219, BUN 88, creatinine 8.02, calcium 10.2, albumin 3.5.  Urinalysis reveals  7-10 white cells, 3-6 red cells and few bacteria.  CK total 525.  IMPRESSION/PLAN: 1. Acute renal failure.  This sounds to likely be secondary to     obstruction.  He will be hospitalized urgently.  He will undergo     bladder scan and Foley catheterization. 2. Hyperkalemia.  He will be treated with fluids and will be treated     with Kayexalate.  Enalapril will be held. 3. Diabetes.  Duetact and metformin will need to be stopped at this     point.  He will be treated with sliding scale NovoLog. 4. Hypertension. 5. History of diabetic neuropathy. 6. Continue gabapentin. 7. Hyperlipidemia. 8. History of memory loss.  We will continue Exelon. 9. Lumbar disk disease status post epidural steroid  injection last     month. 10.Carotid artery disease with recent documentation revealing no     significant stenosis.     Kingsley Callander. Ouida Sills, MD     ROF/MEDQ  D:  08/03/2011  T:  08/04/2011  Job:  [id number]

## 2011-08-05 LAB — CBC
HCT: 31.7 % — ABNORMAL LOW (ref 39.0–52.0)
RDW: 14.4 % (ref 11.5–15.5)
WBC: 4.4 10*3/uL (ref 4.0–10.5)

## 2011-08-05 LAB — GLUCOSE, CAPILLARY
Glucose-Capillary: 55 mg/dL — ABNORMAL LOW (ref 70–99)
Glucose-Capillary: 200 mg/dL — ABNORMAL HIGH (ref 70–99)

## 2011-08-05 LAB — BASIC METABOLIC PANEL
Chloride: 103 mEq/L (ref 96–112)
Creatinine, Ser: 6.65 mg/dL — ABNORMAL HIGH (ref 0.50–1.35)
GFR calc Af Amer: 10 mL/min — ABNORMAL LOW (ref >60)
Potassium: 5 mEq/L (ref 3.5–5.1)

## 2011-08-05 LAB — URINALYSIS, ROUTINE W REFLEX MICROSCOPIC
Glucose, UA: NEGATIVE mg/dL
Leukocytes, UA: NEGATIVE
Protein, ur: NEGATIVE mg/dL
pH: 5.5 (ref 5.0–8.0)

## 2011-08-05 LAB — PHOSPHORUS: Phosphorus: 6.7 mg/dL — ABNORMAL HIGH (ref 2.3–4.6)

## 2011-08-05 MED ORDER — ENOXAPARIN SODIUM 30 MG/0.3ML ~~LOC~~ SOLN
30.0000 mg | SUBCUTANEOUS | Status: DC
  Administered 2011-08-05 – 2011-08-07 (×3): 30 mg via SUBCUTANEOUS
  Filled 2011-08-05 (×3): qty 0.3

## 2011-08-05 MED ORDER — FUROSEMIDE 10 MG/ML IJ SOLN
40.0000 mg | Freq: Two times a day (BID) | INTRAMUSCULAR | Status: DC
  Administered 2011-08-05 – 2011-08-06 (×2): 40 mg via INTRAVENOUS
  Filled 2011-08-05 (×2): qty 4

## 2011-08-05 NOTE — Progress Notes (Signed)
NAME:  SHA, AMER                 ACCOUNT NO.:  [id number]  MEDICAL RECORD NO.:  [id number]  LOCATION:  A334                          FACILITY:  [facility]  PHYSICIAN:  Kingsley Callander. Ouida Sills, MD       DATE OF BIRTH:  03-07-1935  DATE OF PROCEDURE:  08/05/2011 DATE OF DISCHARGE:                                PROGRESS NOTE   Mr. Ellingson has had a brisk diuresis overnight.  His I's and O's revealed a negative 2320 mL.  His blood pressure this morning is 93/54 with a pulse of 59.  He is in sinus rhythm on telemetry.  His temperature is 98.3 and respirations were 18.  He was hypoglycemic again this morning despite holding his oral hypoglycemics.  His glucose is 63 on his metabolic panel.  PHYSICAL EXAMINATION:  LUNGS:  Clear. HEART:  Regular with no murmurs. ABDOMEN:  Soft and nontender. EXTREMITIES:  Reveal no edema.  IMPRESSION/PLAN: 1. Acute renal failure.  His BUN and creatinine have improved to 84     and 6.65.  He is receiving normal saline at 175 mL an hour and     Lasix 100 mg twice a day.  His phosphorus is 6.7, calcium is 8.7,     hemoglobin is 10.4.  Urine culture is pending. 2. Diabetes.  Continue holding oral hypoglycemics.  He is eating well.     Continue sliding scale NovoLog. 3. History of prostate cancer. 4. History of hypertension.  Vasotec has been held because of his     hyperkalemia on admission.  His potassium now is 5.0.  Amlodipine     is currently being held as well.  I will be away over the next few days and Dr. Juanetta Gosling will be Mr. Luecke attending.  Dr. Kristian Covey of course will continue to see him in consultation.     Kingsley Callander. Ouida Sills, MD     ROF/MEDQ  D:  08/05/2011  T:  08/05/2011  Job:  [id number]

## 2011-08-05 NOTE — Progress Notes (Signed)
Manuel [name]  MRN: [id number]  DOB/AGE: [birth year] 76 y.o.  Primary Care Physician:FAGAN,ROY, MD  Admit date: 08/03/2011  Chief Complaint: NOt feeling well  S-Pt presented on  08/03/2011 with complaints of weakness and the exertion after working outside. According to the patient on Saturday he started having some weakness of his leg but improved after a while now. The next morning he was still working outside he had a similar episode of weakness feeling tired. Because of that he went to Dr. Ouida Sills and will have blood work was done and that was found to have significantly elevated creatinine creatinine.   Pt today feels better. Patient denies any nausea no vomiting he denies also any shortness of breath orthopnea paroxysmal nocturnal dyspnea. Patient also denies any history of a fever or chills or sweating.        . enoxaparin (LOVENOX) injection  30 mg Subcutaneous Q24H  . furosemide  100 mg Intravenous BID  . insulin aspart  0-15 Units Subcutaneous TID WC  . insulin aspart  0-5 Units Subcutaneous QHS         GEX:BMWUX from the symptoms mentioned above,there are no other symptoms referable to all systems reviewed.  Physical Exam: BP Readings from Last 3 Encounters:  08/05/11 93/54  07/20/11 191/74   Blood pressure 93/54, pulse 59, temperature 98.3 F (36.8 C), temperature source Oral, resp. rate 18, height 6' 1.5" (1.867 m), weight 258 lb 9.6 oz (117.3 kg), SpO2 95.00%.  BMET    Component Value Date/Time   NA 139 08/05/2011 0421   K 5.0 08/05/2011 0421   CL 103 08/05/2011 0421   CO2 18* 08/05/2011 0421   GLUCOSE 63* 08/05/2011 0421   BUN 84* 08/05/2011 0421   CREATININE 6.65* 08/05/2011 0421   CALCIUM 8.7 08/05/2011 0421   GFRNONAA 8* 08/05/2011 0421   GFRAA 10* 08/05/2011 0421    Crea 8.33-->6.65  CBC    Component Value Date/Time   WBC 4.4 08/05/2011 0421   RBC 3.45* 08/05/2011 0421   HGB 10.4* 08/05/2011 0421   HCT 31.7* 08/05/2011 0421   PLT 195 08/05/2011 0421   MCV 91.9 08/05/2011  0421   MCH 30.1 08/05/2011 0421   MCHC 32.8 08/05/2011 0421   RDW 14.4 08/05/2011 0421   LYMPHSABS 1.3 01/17/2011 2159   MONOABS 0.4 01/17/2011 2159   EOSABS 0.3 01/17/2011 2159   BASOSABS 0.0 01/17/2011 2159     Lab Results  Component Value Date   CALCIUM 8.7 08/05/2011   PHOS 6.7* 08/05/2011        Impression: 1)Renal   AKI on CKD                AKI secondary to ATN/ Pre Renal                AKI slightly better               CKD stage 3 .                2)CVS    Hemodynamically stable  3)Anemia HGb at goal (9--11)  4)CKD Mineral-Bone Disorder PTH not avail Secondary Hyperparathyroidism w/u pending  Phosphorus NOt at goal.   5)FEN  Normokalemic NOrmonatremic  6)Acid base Co2 NOT at goal     Plan: 1) will continue IVF 2) will follow BMet to see the trend in Creat 3)will start ca acetate for high PO4 4) will decrease the dose of lasix to 40mg  iv 5)Will start PO Bicarb  Kacey Dysert S 08/05/2011, 2:59 PM

## 2011-08-05 NOTE — Plan of Care (Signed)
Problem: Consults Goal: Diabetes Guidelines if Diabetic/Glucose > 140 If diabetic or lab glucose is > 140 mg/dl - Initiate Diabetes/Hyperglycemia Guidelines & Document Interventions  Outcome: Progressing Path reviewed and patient assessed. Patient with hypoglycemia episodes most likely due to previous oral agents taken prior admission and poor renal function.  Will continue to monitor patient.

## 2011-08-06 LAB — GLUCOSE, CAPILLARY: Glucose-Capillary: 87 mg/dL (ref 70–99)

## 2011-08-06 LAB — URINE CULTURE: Culture  Setup Time: 201208071502

## 2011-08-06 LAB — BASIC METABOLIC PANEL
GFR calc Af Amer: 12 mL/min — ABNORMAL LOW (ref >60)
GFR calc non Af Amer: 10 mL/min — ABNORMAL LOW (ref >60)
Glucose, Bld: 109 mg/dL — ABNORMAL HIGH (ref 70–99)
Potassium: 4.8 mEq/L (ref 3.5–5.1)
Sodium: 141 mEq/L (ref 135–145)

## 2011-08-06 MED ORDER — FLUOCINONIDE 0.05 % EX CREA
TOPICAL_CREAM | Freq: Two times a day (BID) | CUTANEOUS | Status: DC
  Administered 2011-08-06 – 2011-08-07 (×3): via TOPICAL
  Filled 2011-08-06: qty 30

## 2011-08-06 NOTE — Progress Notes (Signed)
bjective: Patient offers no complaints he'll have any nausea vomiting denies also any shortness of breath.           Physical Exam: The vital signs ZOX:WRUE:  [97.9 F (36.6 C)-98.6 F (37 C)] 98.3 F (36.8 C) (08/09 0451) Pulse Rate:  [57-65] 60  (08/09 0451) Resp:  [18-20] 18  (08/09 0451) BP: (105-177)/(57-65) 105/60 mmHg (08/09 0451) SpO2:  [92 %-97 %] 92 % (08/09 0451) Weight:  [115.713 kg (255 lb 1.6 oz)] 255 lb 1.6 oz (115.713 kg) (08/09 0500) Generally patient is alert in no apparent distress  heart exam no congestive heart other than icterus Chest is clear to auscultation his heart exam revealed regular rate and rhythm no murmur no S3 Abdomen soft positive bowel sound next Extremities he doesn't have any edema.   Investigations: Results for orders placed during the hospital encounter of 08/03/11 (from the past 24 hour(s))  GLUCOSE, CAPILLARY     Status: Abnormal   Collection Time   08/05/11  4:27 PM      Component Value Range   Glucose-Capillary 164 (*) 70 - 99 (mg/dL)   Comment 1 Documented in Chart     Comment 2 Notify RN    GLUCOSE, CAPILLARY     Status: Abnormal   Collection Time   08/05/11  8:17 PM      Component Value Range   Glucose-Capillary 200 (*) 70 - 99 (mg/dL)  BASIC METABOLIC PANEL     Status: Abnormal   Collection Time   08/06/11  5:07 AM      Component Value Range   Sodium 141  135 - 145 (mEq/L)   Potassium 4.8  3.5 - 5.1 (mEq/L)   Chloride 107  96 - 112 (mEq/L)   CO2 19  19 - 32 (mEq/L)   Glucose, Bld 109 (*) 70 - 99 (mg/dL)   BUN 79 (*) 6 - 23 (mg/dL)   Creatinine, Ser 4.54 (*) 0.50 - 1.35 (mg/dL)   Calcium 8.7  8.4 - 09.8 (mg/dL)   GFR calc non Af Amer 10 (*) >60 (mL/min)   GFR calc Af Amer 12 (*) >60 (mL/min)  GLUCOSE, CAPILLARY     Status: Normal   Collection Time   08/06/11  7:36 AM      Component Value Range   Glucose-Capillary 87  70 - 99 (mg/dL)   Comment 1 Documented in Chart     Comment 2 Notify RN    GLUCOSE, CAPILLARY      Status: Abnormal   Collection Time   08/06/11 11:24 AM      Component Value Range   Glucose-Capillary 194 (*) 70 - 99 (mg/dL)   Comment 1 Notify RN     Comment 2 Documented in Chart     Recent Results (from the past 240 hour(s))  URINE CULTURE     Status: Normal   Collection Time   08/03/11  9:13 PM      Component Value Range Status Comment   Specimen Description URINE, CLEAN CATCH   Final    Special Requests NONE   Final    Setup Time [id number]   Final    Colony Count 20,OOO COLONIES/ML   Final    Culture ENTEROBACTER AEROGENES   Final    Report Status 08/06/2011 FINAL   Final    Organism ID, Bacteria ENTEROBACTER AEROGENES   Final        Medications: I have reviewed the patient's current medications.  Impression: Problem #1 acute  renal failure thought to be secondary to prerenal syndrome versus AK and BUN and creatinine this moment seems to be improving. Problem #2 hypertension blood pressure seems to be controlled very well Problem #3 history of her diabetes  Problem #4 history of anemia possibly iron deficiency his H&H has seems to be stable. Problem #5 history of obesity  Problem #6 low CO2 possibly metabolic acidosis related to his renal failure Recommendation we'll discontinued and Lasix We'll continue his IV fluid and will follow his basic metabolic panel. Once his creatinine was below 4 probably will discharge patient and will follow him as an outpatient.       LOS: 3 days   Belita Warsame S 08/06/2011, 12:05 PM

## 2011-08-06 NOTE — Progress Notes (Signed)
Subjective: This is a patient of Dr. Alonza Smoker who was admitted with acute renal failure. This may be due to rhabdomyolysis he also is complaining of poison ivy  Objective: Vital signs in last 24 hours: Temp:  [97.9 F (36.6 C)-98.6 F (37 C)] 98.3 F (36.8 C) (08/09 0451) Pulse Rate:  [57-65] 60  (08/09 0451) Resp:  [18-20] 18  (08/09 0451) BP: (105-177)/(57-65) 105/60 mmHg (08/09 0451) SpO2:  [92 %-97 %] 92 % (08/09 0451) Weight:  [115.713 kg (255 lb 1.6 oz)] 255 lb 1.6 oz (115.713 kg) (08/09 0500) Weight change: -1.588 kg (-3 lb 8 oz) Last BM Date: 08/05/11  Intake/Output from previous day: 08/08 0701 - 08/09 0700 In: 1580 [P.O.:480; I.V.:1100] Out: 3600 [Urine:3600]  PHYSICAL EXAM General appearance: alert, cooperative and no distress Resp: clear to auscultation bilaterally Cardio: regular rate and rhythm, S1, S2 normal, no murmur, click, rub or gallop GI: soft, non-tender; bowel sounds normal; no masses,  no organomegaly Extremities: extremities normal, atraumatic, no cyanosis or edema  Lab Results:    Basic Metabolic Panel:  Basename 08/06/11 0507 08/05/11 0421 08/04/11 0527  NA 141 139 --  K 4.8 5.0 --  CL 107 103 --  CO2 19 18* --  GLUCOSE 109* 63* --  BUN 79* 84* --  CREATININE 5.71* 6.65* --  CALCIUM 8.7 8.7 --  MG -- -- 2.5  PHOS -- 6.7* 6.7*   Liver Function Tests: No results found for this basename: AST:2,ALT:2,ALKPHOS:2,BILITOT:2,PROT:2,ALBUMIN:2 in the last 72 hours No results found for this basename: LIPASE:2,AMYLASE:2 in the last 72 hours CBC:  Basename 08/05/11 0421  WBC 4.4  NEUTROABS --  HGB 10.4*  HCT 31.7*  MCV 91.9  PLT 195   Cardiac Enzymes: No results found for this basename: CKTOTAL:3,CKMB:3,CKMBINDEX:3,TROPONINI:3 in the last 72 hours BNP: No results found for this basename: POCBNP:3 in the last 72 hours D-Dimer: No results found for this basename: DDIMER:2 in the last 72 hours CBG:  Basename 08/06/11 1124 08/06/11 0736  08/05/11 2017 08/05/11 1627 08/05/11 1137 08/05/11 0839  GLUCAP 194* 87 200* 164* 101* 164*   Hemoglobin A1C: No results found for this basename: HGBA1C in the last 72 hours Fasting Lipid Panel: No results found for this basename: CHOL,HDL,LDLCALC,TRIG,CHOLHDL,LDLDIRECT in the last 72 hours Thyroid Function Tests: No results found for this basename: TSH,T4TOTAL,FREET4,T3FREE,THYROIDAB in the last 72 hours Anemia Panel: No results found for this basename: VITAMINB12,FOLATE,FERRITIN,TIBC,IRON,RETICCTPCT in the last 72 hours Urine Drug Screen:  Urinalysis:  Misc. Labs:  ABGS No results found for this basename: PHART,PCO2,PO2ART,TCO2,HCO3 in the last 72 hours CULTURES Recent Results (from the past 240 hour(s))  URINE CULTURE     Status: Normal   Collection Time   08/03/11  9:13 PM      Component Value Range Status Comment   Specimen Description URINE, CLEAN CATCH   Final    Special Requests NONE   Final    Setup Time [id number]   Final    Colony Count 20,OOO COLONIES/ML   Final    Culture ENTEROBACTER AEROGENES   Final    Report Status 08/06/2011 FINAL   Final    Organism ID, Bacteria ENTEROBACTER AEROGENES   Final    Studies/Results: No results found.  Medications:  Scheduled:   . enoxaparin (LOVENOX) injection  30 mg Subcutaneous Q24H  . fluocinonide   Topical BID  . insulin aspart  0-15 Units Subcutaneous TID WC  . insulin aspart  0-5 Units Subcutaneous QHS  . DISCONTD: furosemide  100  mg Intravenous BID  . DISCONTD: furosemide  40 mg Intravenous BID   Continuous:   . sodium chloride 125 mL (08/06/11 1229)   ZOX:[id number], acetaminophen, alum & mag hydroxide-simeth, hydrOXYzine, ondansetron (ZOFRAN) IV, ondansetron  Assesment:his renal function has improved but is still quite abnormal. He does have poison ivy. Active Problems:  * No active hospital problems. *     Plan:continue with his current treatment for his acute renal failure. He is going to use  Topicort cream on his rash    LOS: 3 days   Calene Paradiso L 08/06/2011, 1:43 PM

## 2011-08-07 DIAGNOSIS — M519 Unspecified thoracic, thoracolumbar and lumbosacral intervertebral disc disorder: Secondary | ICD-10-CM | POA: Diagnosis present

## 2011-08-07 DIAGNOSIS — E118 Type 2 diabetes mellitus with unspecified complications: Secondary | ICD-10-CM | POA: Diagnosis present

## 2011-08-07 DIAGNOSIS — N179 Acute kidney failure, unspecified: Secondary | ICD-10-CM | POA: Diagnosis present

## 2011-08-07 DIAGNOSIS — I10 Essential (primary) hypertension: Secondary | ICD-10-CM | POA: Diagnosis present

## 2011-08-07 DIAGNOSIS — N189 Chronic kidney disease, unspecified: Secondary | ICD-10-CM | POA: Diagnosis present

## 2011-08-07 DIAGNOSIS — N400 Enlarged prostate without lower urinary tract symptoms: Secondary | ICD-10-CM | POA: Diagnosis present

## 2011-08-07 DIAGNOSIS — C61 Malignant neoplasm of prostate: Secondary | ICD-10-CM | POA: Diagnosis not present

## 2011-08-07 LAB — CBC
MCH: 30.7 pg (ref 26.0–34.0)
MCV: 92.1 fL (ref 78.0–100.0)
RDW: 14 % (ref 11.5–15.5)

## 2011-08-07 LAB — IRON AND TIBC
Iron: 91 ug/dL (ref 42–135)
Saturation Ratios: 26 % (ref 20–55)
TIBC: 350 ug/dL (ref 215–435)
UIBC: 259 ug/dL

## 2011-08-07 LAB — BASIC METABOLIC PANEL
BUN: 72 mg/dL — ABNORMAL HIGH (ref 6–23)
CO2: 20 mEq/L (ref 19–32)
Chloride: 108 mEq/L (ref 96–112)
GFR calc Af Amer: 14 mL/min — ABNORMAL LOW (ref >60)
Glucose, Bld: 97 mg/dL (ref 70–99)
Potassium: 4.5 mEq/L (ref 3.5–5.1)

## 2011-08-07 LAB — FERRITIN: Ferritin: 68 ng/mL (ref 22–322)

## 2011-08-07 LAB — GLUCOSE, CAPILLARY: Glucose-Capillary: 187 mg/dL — ABNORMAL HIGH (ref 70–99)

## 2011-08-07 MED ORDER — ENOXAPARIN SODIUM 40 MG/0.4ML ~~LOC~~ SOLN
40.0000 mg | SUBCUTANEOUS | Status: DC
  Filled 2011-08-07: qty 0.4

## 2011-08-07 NOTE — Progress Notes (Signed)
ANTICOAGULATION CONSULT NOTE - Initial Consult  Pharmacy Consult for Lovenox Indication: VTE prophylaxis  Patient Measurements: Height: 6' 1.5" (186.7 cm) Weight: 253 lb 8 oz (114.987 kg) IBW/kg (Calculated) : 81.05   Vital Signs: Temp: 98.6 F (37 C) (08/10 0525) Temp src: Oral (08/10 0525) BP: 110/53 mmHg (08/10 0525) Pulse Rate: 51  (08/10 0525)  Labs:  Basename 08/07/11 0527 08/06/11 0507 08/05/11 0421  HGB 10.5* -- 10.4*  HCT 31.5* -- 31.7*  PLT 187 -- 195  APTT -- -- --  LABPROT -- -- --  INR -- -- --  HEPARINUNFRC -- -- --  CREATININE 5.04* 5.71* 6.65*  CRCLEARANCE -- -- --  CKTOTAL -- -- --  CKMB -- -- --  TROPONINI -- -- --    Medical History: Past Medical History  Diagnosis Date  . Hypertension   . Diabetes mellitus   . Cancer     Medications:  Prescriptions prior to admission  Medication Sig Dispense Refill  . amLODipine (NORVASC) 5 MG tablet Take 5 mg by mouth daily.        Marland Kitchen aspirin 81 MG tablet Take 81 mg by mouth daily.        . enalapril (VASOTEC) 10 MG tablet Take 10 mg by mouth daily.        . fenofibrate 160 MG tablet Take 160 mg by mouth daily.        . fish oil-omega-3 fatty acids 1000 MG capsule Take 1 g by mouth 3 (three) times daily.        Marland Kitchen gabapentin (NEURONTIN) 600 MG tablet Take 600 mg by mouth 3 (three) times daily.        . metFORMIN (GLUCOPHAGE) 1000 MG tablet Take 1,000 mg by mouth 2 (two) times daily with a meal.        . Multiple Vitamin (ONE-A-DAY MENS PO) Take 1 tablet by mouth daily.        . pioglitazone-glimepiride (DUETACT) 30-2 MG per tablet Take 1 tablet by mouth daily with breakfast.        . rivastigmine (EXELON) 3 MG capsule Take 3 mg by mouth 2 (two) times daily.          Assessment: Okay for Protocol  Goal of Therapy:  Prophylaxis Dosing   Plan:  Lovenox 40mg  sq daily.  No further adjustment needed. Sign off.  Lamonte Richer R 08/07/2011,11:59 AM

## 2011-08-07 NOTE — Progress Notes (Signed)
Subjective: He says he feels better and has no new complaints.  Objective: Vital signs in last 24 hours: Temp:  [98.3 F (36.8 C)-98.8 F (37.1 C)] 98.6 F (37 C) (08/10 0525) Pulse Rate:  [51-59] 51  (08/10 0525) Resp:  [18-20] 18  (08/10 0525) BP: (110-138)/(53-70) 110/53 mmHg (08/10 0525) SpO2:  [93 %-97 %] 93 % (08/10 0525) Weight:  [114.987 kg (253 lb 8 oz)] 253 lb 8 oz (114.987 kg) (08/10 0525) Weight change: -0.726 kg (-1 lb 9.6 oz) Last BM Date: 08/05/11  Intake/Output from previous day: 08/09 0701 - 08/10 0700 In: 2432.2 [P.O.:600; I.V.:1824.2; IV Piggyback:8] Out: 3450 [Urine:3450]  PHYSICAL EXAM General appearance: alert, cooperative, no distress and moderately obese Resp: clear to auscultation bilaterally Cardio: regular rate and rhythm, S1, S2 normal, no murmur, click, rub or gallop GI: soft, non-tender; bowel sounds normal; no masses,  no organomegaly Extremities: extremities normal, atraumatic, no cyanosis or edema  Lab Results:    Basic Metabolic Panel:  Basename 08/07/11 0527 08/06/11 0507 08/05/11 0421  NA 142 141 --  K 4.5 4.8 --  CL 108 107 --  CO2 20 19 --  GLUCOSE 97 109* --  BUN 72* 79* --  CREATININE 5.04* 5.71* --  CALCIUM 8.8 8.7 --  MG -- -- --  PHOS -- -- 6.7*   Liver Function Tests: No results found for this basename: AST:2,ALT:2,ALKPHOS:2,BILITOT:2,PROT:2,ALBUMIN:2 in the last 72 hours No results found for this basename: LIPASE:2,AMYLASE:2 in the last 72 hours CBC:  Basename 08/07/11 0527 08/05/11 0421  WBC 3.6* 4.4  NEUTROABS -- --  HGB 10.5* 10.4*  HCT 31.5* 31.7*  MCV 92.1 91.9  PLT 187 195   Cardiac Enzymes: No results found for this basename: CKTOTAL:3,CKMB:3,CKMBINDEX:3,TROPONINI:3 in the last 72 hours BNP: No results found for this basename: POCBNP:3 in the last 72 hours D-Dimer: No results found for this basename: DDIMER:2 in the last 72 hours CBG:  Basename 08/07/11 0733 08/06/11 2057 08/06/11 1620 08/06/11  1124 08/06/11 0736 08/05/11 2017  GLUCAP 72 180* 91 194* 87 200*   Hemoglobin A1C: No results found for this basename: HGBA1C in the last 72 hours Fasting Lipid Panel: No results found for this basename: CHOL,HDL,LDLCALC,TRIG,CHOLHDL,LDLDIRECT in the last 72 hours Thyroid Function Tests: No results found for this basename: TSH,T4TOTAL,FREET4,T3FREE,THYROIDAB in the last 72 hours Anemia Panel: No results found for this basename: VITAMINB12,FOLATE,FERRITIN,TIBC,IRON,RETICCTPCT in the last 72 hours Urine Drug Screen:  Urinalysis:  Misc. Labs:  ABGS No results found for this basename: PHART,PCO2,PO2ART,TCO2,HCO3 in the last 72 hours CULTURES Recent Results (from the past 240 hour(s))  URINE CULTURE     Status: Normal   Collection Time   08/03/11  9:13 PM      Component Value Range Status Comment   Specimen Description URINE, CLEAN CATCH   Final    Special Requests NONE   Final    Setup Time [id number]   Final    Colony Count 20,OOO COLONIES/ML   Final    Culture ENTEROBACTER AEROGENES   Final    Report Status 08/06/2011 FINAL   Final    Organism ID, Bacteria ENTEROBACTER AEROGENES   Final    Studies/Results: No results found.  Medications:  Scheduled:   . enoxaparin (LOVENOX) injection  30 mg Subcutaneous Q24H  . fluocinonide   Topical BID  . insulin aspart  0-15 Units Subcutaneous TID WC  . insulin aspart  0-5 Units Subcutaneous QHS  . DISCONTD: furosemide  40 mg Intravenous BID  Continuous:   . sodium chloride 1,000 mL (08/07/11 0402)   ZOX:[id number], acetaminophen, alum & mag hydroxide-simeth, hydrOXYzine, ondansetron (ZOFRAN) IV, ondansetron  Assesment: He has acute on chronic renal failure in he is improving. His creatinine is still above 5 however. He has other problems including diabetes mellitus which is pretty well controlled and hypertension which is well controlled so far. He is other chronic medical problems I did not assess today Principal Problem:   *Acute on chronic renal failure Active Problems:  BPH (benign prostatic hyperplasia)  Prostate cancer  Hypertension  Diabetes mellitus type 2 with complications  Lumbar disc disease    Plan: No change in his treatments as mentioned by the nephrology team he will be discharged once his creatinine is below 4.    LOS: 4 days   Monaca Wadas L 08/07/2011, 8:36 AM

## 2011-08-07 NOTE — Progress Notes (Signed)
bjective: He'll have any complaints he'll have any nausea no vomiting. He denies also any shortness of breath.           Physical Exam: The vital signs NWG:NFAO:  [98.3 F (36.8 C)-98.8 F (37.1 C)] 98.6 F (37 C) (08/10 0525) Pulse Rate:  [51-59] 51  (08/10 0525) Resp:  [18-20] 18  (08/10 0525) BP: (110-138)/(53-70) 110/53 mmHg (08/10 0525) SpO2:  [93 %-97 %] 93 % (08/10 0525) Weight:  [114.987 kg (253 lb 8 oz)] 253 lb 8 oz (114.987 kg) (08/10 0525) Generally he is alert no apparent distress Her chest is clear to auscultation no rales no rhonchi or wheezing His heart exam regular rate and rhythm no murmur history Abdomen positive bowel sound Extremities no edema.   Investigations: Results for orders placed during the hospital encounter of 08/03/11 (from the past 24 hour(s))  GLUCOSE, CAPILLARY     Status: Normal   Collection Time   08/06/11  4:20 PM      Component Value Range   Glucose-Capillary 91  70 - 99 (mg/dL)   Comment 1 Documented in Chart     Comment 2 Notify RN    GLUCOSE, CAPILLARY     Status: Abnormal   Collection Time   08/06/11  8:57 PM      Component Value Range   Glucose-Capillary 180 (*) 70 - 99 (mg/dL)  CBC     Status: Abnormal   Collection Time   08/07/11  5:27 AM      Component Value Range   WBC 3.6 (*) 4.0 - 10.5 (K/uL)   RBC 3.42 (*) 4.22 - 5.81 (MIL/uL)   Hemoglobin 10.5 (*) 13.0 - 17.0 (g/dL)   HCT 13.0 (*) 86.5 - 52.0 (%)   MCV 92.1  78.0 - 100.0 (fL)   MCH 30.7  26.0 - 34.0 (pg)   MCHC 33.3  30.0 - 36.0 (g/dL)   RDW 78.4  69.6 - 29.5 (%)   Platelets 187  150 - 400 (K/uL)  BASIC METABOLIC PANEL     Status: Abnormal   Collection Time   08/07/11  5:27 AM      Component Value Range   Sodium 142  135 - 145 (mEq/L)   Potassium 4.5  3.5 - 5.1 (mEq/L)   Chloride 108  96 - 112 (mEq/L)   CO2 20  19 - 32 (mEq/L)   Glucose, Bld 97  70 - 99 (mg/dL)   BUN 72 (*) 6 - 23 (mg/dL)   Creatinine, Ser 2.84 (*) 0.50 - 1.35 (mg/dL)   Calcium 8.8  8.4 -  10.5 (mg/dL)   GFR calc non Af Amer 11 (*) >60 (mL/min)   GFR calc Af Amer 14 (*) >60 (mL/min)  GLUCOSE, CAPILLARY     Status: Normal   Collection Time   08/07/11  7:33 AM      Component Value Range   Glucose-Capillary 72  70 - 99 (mg/dL)  GLUCOSE, CAPILLARY     Status: Abnormal   Collection Time   08/07/11 11:44 AM      Component Value Range   Glucose-Capillary 187 (*) 70 - 99 (mg/dL)   Recent Results (from the past 240 hour(s))  URINE CULTURE     Status: Normal   Collection Time   08/03/11  9:13 PM      Component Value Range Status Comment   Specimen Description URINE, CLEAN CATCH   Final    Special Requests NONE   Final    Setup  Time [id number]   Final    Colony Count 20,OOO COLONIES/ML   Final    Culture ENTEROBACTER AEROGENES   Final    Report Status 08/06/2011 FINAL   Final    Organism ID, Bacteria ENTEROBACTER AEROGENES   Final        Medications: I have reviewed the patient's current medications.  Impression: Problem #1 acute renal failure this is seems to be secondary to prerenal syndrome patient presently on IV fluid and his kidney creatinine progressively improving. Patient does not have any uremic sinus symptoms. Problem #2 hypertension blood pressure seems to be controlled very well Problem #3 history of diabetes Problem #4 history of BPH Problem #5 history of degenerative joint disease Problem #6 history of obesity.      Plan: We'll continue with the IV hydration We'll check his CBC in the pacing metabolic panel in the morning.     LOS: 4 days   Renan Danese S 08/07/2011, 12:12 PM

## 2011-08-08 LAB — BASIC METABOLIC PANEL
CO2: 20 mEq/L (ref 19–32)
Chloride: 110 mEq/L (ref 96–112)
Creatinine, Ser: 3.95 mg/dL — ABNORMAL HIGH (ref 0.50–1.35)
Sodium: 143 mEq/L (ref 135–145)

## 2011-08-08 LAB — CBC
MCV: 92.5 fL (ref 78.0–100.0)
Platelets: 184 10*3/uL (ref 150–400)
RBC: 3.32 MIL/uL — ABNORMAL LOW (ref 4.22–5.81)
WBC: 3.3 10*3/uL — ABNORMAL LOW (ref 4.0–10.5)

## 2011-08-08 LAB — GLUCOSE, CAPILLARY: Glucose-Capillary: 124 mg/dL — ABNORMAL HIGH (ref 70–99)

## 2011-08-08 MED ORDER — FLUOCINONIDE 0.05 % EX CREA: TOPICAL_CREAM | Freq: Two times a day (BID) | CUTANEOUS | Status: AC

## 2011-08-08 NOTE — Progress Notes (Signed)
Subjective: Interval History: has no complaint of shortness of breath or orthopnea. He denies also any nausea vomiting..  Objective: Vital signs in last 24 hours: Temp:  [98.1 F (36.7 C)-98.8 F (37.1 C)] 98.5 F (36.9 C) (08/11 0557) Pulse Rate:  [48-56] 48  (08/11 0557) Resp:  [19-20] 20  (08/11 0557) BP: (121-130)/(53-70) 121/53 mmHg (08/11 0557) SpO2:  [96 %-98 %] 97 % (08/11 0557) Weight:  [116.348 kg (256 lb 8 oz)] 256 lb 8 oz (116.348 kg) (08/11 0600) Weight change: 1.361 kg (3 lb)  Intake/Output from previous day: 08/10 0701 - 08/11 0700 In: 4980 [P.O.:480; I.V.:4500] Out: 1700 [Urine:1700] Intake/Output this shift:    General appearance: alert Resp: clear to auscultation bilaterally Cardio: regular rate and rhythm, S1, S2 normal, no murmur, click, rub or gallop Extremities: extremities normal, atraumatic, no cyanosis or edema  Lab Results:  [facility] 08/08/11 0613 08/07/11 0527  WBC 3.3* 3.6*  HGB 10.3* 10.5*  HCT 30.7* 31.5*  PLT 184 187   BMET:  Basename 08/08/11 0613 08/07/11 0527  NA 143 142  K 4.5 4.5  CL 110 108  CO2 20 20  GLUCOSE 122* 97  BUN 61* 72*  CREATININE 3.95* 5.04*  CALCIUM 8.8 8.8   No results found for this basename: PTH:2 in the last 72 hours Iron Studies:  Basename 08/07/11 0527  IRON 91  TIBC 350  TRANSFERRIN --  FERRITIN 68    Studies/Results: No results found.  I have reviewed the patient's current medications.  Assessment/Plan: Problem #1 Acute on chronic renal failure presently her he's pending creatinine has come down to 61 and 3.95 respectively. Patient is a symptomatic has a normal potassium. He Problem #2 history of hypertension his blood pressure seems to be controlled very well Problem #3 anemia this will seems to be a combination of iron deficiency anemia as his iron saturation is 68 which seems to be low and possibly anemia of chronic disease. His hemoglobin and hematocrit at this moment remains  stable. Problem #4 history of diabetes his father sugar seems to be controlled very well Problem #5 history of BPH Problem #6 history of degenerative joint disease. Problem #7 history ofobesity.  Recommendation we'll continue his present management Advise patient to continue to increase his fluid intake Charise Killian patient was in a week and if is going to be discharged today. This is end of followup thank you  LOS: 5 days   Edythe Riches S 08/08/2011,9:24 AM

## 2011-08-08 NOTE — Progress Notes (Signed)
Patient discharged home with family.

## 2011-08-08 NOTE — Progress Notes (Signed)
Subjective: He feels much better. No new complaints. His creatinine is down below 4 now. His poison ivy is getting better. He wants to go home.  Objective: Vital signs in last 24 hours: Temp:  [98.1 F (36.7 C)-98.8 F (37.1 C)] 98.5 F (36.9 C) (08/11 0557) Pulse Rate:  [48-56] 48  (08/11 0557) Resp:  [19-20] 20  (08/11 0557) BP: (121-130)/(53-70) 121/53 mmHg (08/11 0557) SpO2:  [96 %-98 %] 97 % (08/11 0557) Weight:  [116.348 kg (256 lb 8 oz)] 256 lb 8 oz (116.348 kg) (08/11 0600) Weight change: 1.361 kg (3 lb) Last BM Date: 08/07/11  Intake/Output from previous day: 08/10 0701 - 08/11 0700 In: 4980 [P.O.:480; I.V.:4500] Out: 1700 [Urine:1700]  PHYSICAL EXAM General appearance: alert, cooperative, no distress and moderately obese Resp: clear to auscultation bilaterally Cardio: regular rate and rhythm, S1, S2 normal, no murmur, click, rub or gallop GI: soft, non-tender; bowel sounds normal; no masses,  no organomegaly Extremities: extremities normal, atraumatic, no cyanosis or edema  Lab Results:    Basic Metabolic Panel:  Basename 08/08/11 0613 08/07/11 0527  NA 143 142  K 4.5 4.5  CL 110 108  CO2 20 20  GLUCOSE 122* 97  BUN 61* 72*  CREATININE 3.95* 5.04*  CALCIUM 8.8 8.8  MG -- --  PHOS -- --   Liver Function Tests: No results found for this basename: AST:2,ALT:2,ALKPHOS:2,BILITOT:2,PROT:2,ALBUMIN:2 in the last 72 hours No results found for this basename: LIPASE:2,AMYLASE:2 in the last 72 hours CBC:  Basename 08/08/11 0613 08/07/11 0527  WBC 3.3* 3.6*  NEUTROABS -- --  HGB 10.3* 10.5*  HCT 30.7* 31.5*  MCV 92.5 92.1  PLT 184 187   Cardiac Enzymes: No results found for this basename: CKTOTAL:3,CKMB:3,CKMBINDEX:3,TROPONINI:3 in the last 72 hours BNP: No results found for this basename: POCBNP:3 in the last 72 hours D-Dimer: No results found for this basename: DDIMER:2 in the last 72 hours CBG:  Basename 08/08/11 0720 08/07/11 2201 08/07/11 1634  08/07/11 1144 08/07/11 0733 08/06/11 2057  GLUCAP 124* 158* 148* 187* 72 180*   Hemoglobin A1C: No results found for this basename: HGBA1C in the last 72 hours Fasting Lipid Panel: No results found for this basename: CHOL,HDL,LDLCALC,TRIG,CHOLHDL,LDLDIRECT in the last 72 hours Thyroid Function Tests: No results found for this basename: TSH,T4TOTAL,FREET4,T3FREE,THYROIDAB in the last 72 hours Anemia Panel:  Basename 08/07/11 0527  VITAMINB12 --  FOLATE --  FERRITIN 68  TIBC 350  IRON 91  RETICCTPCT --   Urine Drug Screen:  Urinalysis:  Misc. Labs:  ABGS No results found for this basename: PHART,PCO2,PO2ART,TCO2,HCO3 in the last 72 hours CULTURES Recent Results (from the past 240 hour(s))  URINE CULTURE     Status: Normal   Collection Time   08/03/11  9:13 PM      Component Value Range Status Comment   Specimen Description URINE, CLEAN CATCH   Final    Special Requests NONE   Final    Setup Time [id number]   Final    Colony Count 20,OOO COLONIES/ML   Final    Culture ENTEROBACTER AEROGENES   Final    Report Status 08/06/2011 FINAL   Final    Organism ID, Bacteria ENTEROBACTER AEROGENES   Final    Studies/Results: No results found.  Medications:  Prior to Admission:  Prescriptions prior to admission  Medication Sig Dispense Refill  . amLODipine (NORVASC) 5 MG tablet Take 5 mg by mouth daily.        Marland Kitchen aspirin 81 MG tablet  Take 81 mg by mouth daily.        . fenofibrate 160 MG tablet Take 160 mg by mouth daily.        . fish oil-omega-3 fatty acids 1000 MG capsule Take 1 g by mouth 3 (three) times daily.        Marland Kitchen gabapentin (NEURONTIN) 600 MG tablet Take 600 mg by mouth 3 (three) times daily.        . Multiple Vitamin (ONE-A-DAY MENS PO) Take 1 tablet by mouth daily.        . pioglitazone-glimepiride (DUETACT) 30-2 MG per tablet Take 1 tablet by mouth daily with breakfast.        . rivastigmine (EXELON) 3 MG capsule Take 3 mg by mouth 2 (two) times daily.          Scheduled:   . enoxaparin (LOVENOX) injection  40 mg Subcutaneous Q24H  . fluocinonide   Topical BID  . insulin aspart  0-15 Units Subcutaneous TID WC  . insulin aspart  0-5 Units Subcutaneous QHS  . DISCONTD: enoxaparin (LOVENOX) injection  30 mg Subcutaneous Q24H   Continuous:   . sodium chloride 125 mL/hr at 08/08/11 0448   ZOX:[id number], acetaminophen, alum & mag hydroxide-simeth, hydrOXYzine, ondansetron (ZOFRAN) IV, ondansetron  Assesment: He had acute on chronic renal failure but is much better. He has diabetes and he was on metformin which I will discontinue. He was also on enalapril for hypertension which I will discontinue. Principal Problem:  *Acute on chronic renal failure Active Problems:  BPH (benign prostatic hyperplasia)  Prostate cancer  Hypertension  Diabetes mellitus type 2 with complications  Lumbar disc disease    Plan: He'll be able to be discharged home today he'll have followup with Dr. Ouida Sills who is his primary care physician and with Dr. Kristian Covey    LOS: 5 days   Jaquise Faux L 08/08/2011, 9:23 AM

## 2011-08-08 NOTE — Discharge Summary (Signed)
Physician Discharge Summary  Patient ID: Manuel [name] MRN: [id number] DOB/AGE: 1935-10-27 76 y.o. Primary Care Physician:FAGAN,ROY, MD Admit date: 08/03/2011 Discharge date: 08/08/2011    Discharge Diagnoses:   Principal Problem:  *Acute on chronic renal failure Active Problems:  BPH (benign prostatic hyperplasia)  Prostate cancer  Hypertension  Diabetes mellitus type 2 with complications  Lumbar disc disease  poison ivy  Current Discharge Medication List    START taking these medications   Details  fluocinonide (LIDEX) 0.05 % cream Apply topically 2 (two) times daily. Qty: 30 g, Refills: 1      CONTINUE these medications which have NOT CHANGED   Details  amLODipine (NORVASC) 5 MG tablet Take 5 mg by mouth daily.      aspirin 81 MG tablet Take 81 mg by mouth daily.      fenofibrate 160 MG tablet Take 160 mg by mouth daily.      fish oil-omega-3 fatty acids 1000 MG capsule Take 1 g by mouth 3 (three) times daily.      gabapentin (NEURONTIN) 600 MG tablet Take 600 mg by mouth 3 (three) times daily.      Multiple Vitamin (ONE-A-DAY MENS PO) Take 1 tablet by mouth daily.      pioglitazone-glimepiride (DUETACT) 30-2 MG per tablet Take 1 tablet by mouth daily with breakfast.      rivastigmine (EXELON) 3 MG capsule Take 3 mg by mouth 2 (two) times daily.        STOP taking these medications     enalapril (VASOTEC) 10 MG tablet      metFORMIN (GLUCOPHAGE) 1000 MG tablet         Discharged Condition: Improved    Consults: Nephrology  Significant Diagnostic Studies: Ct Abdomen Pelvis Wo Contrast  08/03/2011  *RADIOLOGY REPORT*  Clinical Data: Acute renal failure, evaluate for obstruction, history of prostate cancer  CT ABDOMEN AND PELVIS WITHOUT CONTRAST  Technique:  Multidetector CT imaging of the abdomen and pelvis was performed following the standard protocol without intravenous contrast.  Comparison: 11/14/2007  Findings: Calcified pleural plaques at the lung  bases.  Unenhanced liver, spleen, pancreas, and adrenal glands are within normal limits.  Status post cholecystectomy.  No intrahepatic or extrahepatic ductal dilatation.  Bilateral renal scarring.  3 mm nonobstructing calculus in the left lower pole (series 3/image 42).  No hydronephrosis.  No evidence of bowel obstruction.  Normal appendix.  Colonic diverticulosis, without associated inflammatory changes.  Atherosclerotic calcifications of the abdominal aorta and branch vessels.  No abdominopelvic ascites.  No suspicious lymphadenopathy.  Prostate is notable for brachytherapy seeds.  Bladder is thick-walled but underdistended.  Degenerative changes of the visualized thoracolumbar spine.  No focal osseous lesions.  IMPRESSION: 3 mm nonobstructing left lower pole calculus.  No hydronephrosis.  Bladder is thick-walled but underdistended.  Brachytherapy seeds in the prostate.  No evidence of metastatic disease in the abdomen/pelvis.  Original Report Authenticated By: Charline Bills, M.D.   Dg Lumbar Spine Complete  07/13/2011  *RADIOLOGY REPORT*  Clinical Data: Low back pain.  Bilateral leg pain.  No injury. History of lumbar spinal surgery.  LUMBAR SPINE - COMPLETE 4+ VIEW  Comparison: 11/14/2007.  Findings: Five lumbar type vertebral bodies.  Severe spondylosis present at L4-L5 with vacuum disc, grade 1 retrolisthesis and severe degenerative endplate changes.  Posterior osteophytes protrude into the neural foramina.  Trace retrolisthesis of L2 on L3 is present which remains fixed through flexion and extension maneuvers.  There is no motion at  L4-L5 with flexion and extension. The lower lumbar facet arthrosis.  Vertebral body height preserved. Mild degenerative disc disease is present at L1-L2.  L5-S1 disc appears within normal limits.  Mild posterior disc space loss at L3- L4.  Question right laminotomy at L4.  Cholecystectomy clips present in the right upper quadrant. No abdominal aortic atherosclerosis.   IMPRESSION: Multilevel lumbar spondylosis.  Spondylosis most pronounced at L4- L5 and L2-L3.  Original Report Authenticated By: Andreas Newport, M.D.   Dg Hip Complete Left  07/20/2011  *RADIOLOGY REPORT*  Clinical Data: Left groin and hip pain  LEFT HIP - COMPLETE 2+ VIEW  Comparison: Lumbar spine films of 07/13/2011 and CT abdomen pelvis of 11/14/2007  Findings: There is moderate degenerative joint disease of the left hip with some loss of joint space superiorly.  There is sclerosis and some acetabular spurring present.  No acute bony abnormality is seen.  Multiple prostate implant seeds are noted.  There is mild degenerative change of the left SI joint.  IMPRESSION: Moderate degenerative joint disease of the left hip.  Original Report Authenticated By: Juline Patch, M.D.   Mr Lumbar Spine Wo Contrast  07/13/2011  *RADIOLOGY REPORT*  Clinical Data: Low back and left leg pain.  MRI LUMBAR SPINE WITHOUT CONTRAST  Technique:  Multiplanar and multiecho pulse sequences of the lumbar spine were obtained without intravenous contrast.  Comparison: None.  Findings: Vertebral body height, vertebral body height and alignment are normal.  No worrisome marrow lesion with some degenerative discogenic marrow signal change noted at L4-5.  Conus medullaris is normal in signal and position.  Imaged intra- abdominal contents are unremarkable.  The T12-L1 level is imaged in the sagittal plane only and negative.  L1-2:  Mild disc bulge without central canal or foraminal stenosis.  L2-3:  The patient has a right paracentral and lateral recess disc protrusion.  The disc encroaches on the descending right L3 root and causes mild central canal narrowing.  Neural foramina appear open.  L3-4:  Mild disc bulge and mild to moderate facet degenerative change.  Central spinal canal is mildly narrowed.  Neural foramina remain open.  L4-5:  The patient has moderate to advanced bilateral facet degenerative disease, greater on the right.   There is diffuse broad- based disc bulge causing mild central canal narrowing and some narrowing of the lateral recesses.  Moderate to moderately severe bilateral foraminal narrowing appears worse on the left.  L5-S1:  Moderate to advanced bilateral facet degenerative change is present.  There is a mild disc bulge but the central canal is open. Moderate bilateral foraminal narrowing is noted.  Small synovial cysts are seen off the posterior, inferior aspects of the facet joints.  The cysts do not impact central canal or foramina.  IMPRESSION:  1.  Right paracentral and lateral recess protrusion at L2-3 encroaches on the descending right L3 root.  There is mild central canal narrowing at this level. 2.  Mild central canal narrowing L3-4 due to disc bulge. 3.  Moderate to moderately severe bilateral foraminal narrowing L4- 5, worse on the left, due to disc bulge.  Facet degenerative disease at this level also noted. 4.  Moderate bilateral foraminal narrowing L5-S1.  Original Report Authenticated By: [id number]   Dg Epidurography  07/20/2011  *RADIOLOGY REPORT*  CLINICAL DATA:  Lumbosacral spondylosis without myelopathy. Displacement of the L2-3 lumbar disc on the right.  Right lower extremity radiculitis.  LUMBAR EPIDURAL INJECTION: An interlaminar approach was performed on the right at  L2-3.  The overlying skin was cleansed and anesthetized.  A 20 gauge spinal needle was advanced using loss-of-resistance technique.  Injection of 2cc of Omnipaque 180 confirmed epidural placement.  There was no evidence for intravascular or intrathecal spread of contrast.  I then injected 120 mg of Depo-Medrol and 3ml of 1% lidocaine.  The patient tolerated the procedure without evidence for complication. The patient  was observed for 20 minutes prior to discharge in stable neurologic condition.  FLUORO TIME:  27 seconds  IMPRESSIONS:  Technically successful first interlaminar epidural steroid injection on the right at L2-3.  Original  Report Authenticated By: Jamesetta Orleans. MATTERN, M.D.    Lab Results: Results for orders placed during the hospital encounter of 08/03/11 (from the past 48 hour(s))  GLUCOSE, CAPILLARY     Status: Abnormal   Collection Time   08/06/11 11:24 AM      Component Value Range Comment   Glucose-Capillary 194 (*) 70 - 99 (mg/dL)    Comment 1 Notify RN      Comment 2 Documented in Chart     GLUCOSE, CAPILLARY     Status: Normal   Collection Time   08/06/11  4:20 PM      Component Value Range Comment   Glucose-Capillary 91  70 - 99 (mg/dL)    Comment 1 Documented in Chart      Comment 2 Notify RN     GLUCOSE, CAPILLARY     Status: Abnormal   Collection Time   08/06/11  8:57 PM      Component Value Range Comment   Glucose-Capillary 180 (*) 70 - 99 (mg/dL)   IRON AND TIBC     Status: Normal   Collection Time   08/07/11  5:27 AM      Component Value Range Comment   Iron 91  42 - 135 (ug/dL)    TIBC 696  295 - 284 (ug/dL)    Saturation Ratios 26  20 - 55 (%)    UIBC 259     FERRITIN     Status: Normal   Collection Time   08/07/11  5:27 AM      Component Value Range Comment   Ferritin 68  22 - 322 (ng/mL)   CBC     Status: Abnormal   Collection Time   08/07/11  5:27 AM      Component Value Range Comment   WBC 3.6 (*) 4.0 - 10.5 (K/uL)    RBC 3.42 (*) 4.22 - 5.81 (MIL/uL)    Hemoglobin 10.5 (*) 13.0 - 17.0 (g/dL)    HCT 13.2 (*) 44.0 - 52.0 (%)    MCV 92.1  78.0 - 100.0 (fL)    MCH 30.7  26.0 - 34.0 (pg)    MCHC 33.3  30.0 - 36.0 (g/dL)    RDW 10.2  72.5 - 36.6 (%)    Platelets 187  150 - 400 (K/uL)   BASIC METABOLIC PANEL     Status: Abnormal   Collection Time   08/07/11  5:27 AM      Component Value Range Comment   Sodium 142  135 - 145 (mEq/L)    Potassium 4.5  3.5 - 5.1 (mEq/L)    Chloride 108  96 - 112 (mEq/L)    CO2 20  19 - 32 (mEq/L)    Glucose, Bld 97  70 - 99 (mg/dL)    BUN 72 (*) 6 - 23 (mg/dL)    Creatinine, Ser 4.40 (*) 0.50 -  1.35 (mg/dL)    Calcium 8.8  8.4 - 10.5  (mg/dL)    GFR calc non Af Amer 11 (*) >60 (mL/min)    GFR calc Af Amer 14 (*) >60 (mL/min)   GLUCOSE, CAPILLARY     Status: Normal   Collection Time   08/07/11  7:33 AM      Component Value Range Comment   Glucose-Capillary 72  70 - 99 (mg/dL)   GLUCOSE, CAPILLARY     Status: Abnormal   Collection Time   08/07/11 11:44 AM      Component Value Range Comment   Glucose-Capillary 187 (*) 70 - 99 (mg/dL)   GLUCOSE, CAPILLARY     Status: Abnormal   Collection Time   08/07/11  4:34 PM      Component Value Range Comment   Glucose-Capillary 148 (*) 70 - 99 (mg/dL)   GLUCOSE, CAPILLARY     Status: Abnormal   Collection Time   08/07/11 10:01 PM      Component Value Range Comment   Glucose-Capillary 158 (*) 70 - 99 (mg/dL)   CBC     Status: Abnormal   Collection Time   08/08/11  6:13 AM      Component Value Range Comment   WBC 3.3 (*) 4.0 - 10.5 (K/uL)    RBC 3.32 (*) 4.22 - 5.81 (MIL/uL)    Hemoglobin 10.3 (*) 13.0 - 17.0 (g/dL)    HCT 40.9 (*) 81.1 - 52.0 (%)    MCV 92.5  78.0 - 100.0 (fL)    MCH 31.0  26.0 - 34.0 (pg)    MCHC 33.6  30.0 - 36.0 (g/dL)    RDW 91.4  78.2 - 95.6 (%)    Platelets 184  150 - 400 (K/uL)   BASIC METABOLIC PANEL     Status: Abnormal   Collection Time   08/08/11  6:13 AM      Component Value Range Comment   Sodium 143  135 - 145 (mEq/L)    Potassium 4.5  3.5 - 5.1 (mEq/L)    Chloride 110  96 - 112 (mEq/L)    CO2 20  19 - 32 (mEq/L)    Glucose, Bld 122 (*) 70 - 99 (mg/dL)    BUN 61 (*) 6 - 23 (mg/dL)    Creatinine, Ser 2.13 (*) 0.50 - 1.35 (mg/dL)    Calcium 8.8  8.4 - 10.5 (mg/dL)    GFR calc non Af Amer 15 (*) >60 (mL/min)    GFR calc Af Amer 18 (*) >60 (mL/min)   GLUCOSE, CAPILLARY     Status: Abnormal   Collection Time   08/08/11  7:20 AM      Component Value Range Comment   Glucose-Capillary 124 (*) 70 - 99 (mg/dL)    Recent Results (from the past 240 hour(s))  URINE CULTURE     Status: Normal   Collection Time   08/03/11  9:13 PM      Component  Value Range Status Comment   Specimen Description URINE, CLEAN CATCH   Final    Special Requests NONE   Final    Setup Time [id number]   Final    Colony Count 20,OOO COLONIES/ML   Final    Culture ENTEROBACTER AEROGENES   Final    Report Status 08/06/2011 FINAL   Final    Organism ID, Bacteria ENTEROBACTER AEROGENES   Final      Hospital Course: He was admitted with acute renal failure. He had some  relatively mild chronic renal failure in addition. He was given IV fluids Lasix and his renal function improved. He did have some complaints of a skin rash and was placed on steroid creams for that. This also improved. By the time of discharge his creatinine was down to 3.95. His blood sugar remained under good control  Discharge Exam: Blood pressure 121/53, pulse 48, temperature 98.5 F (36.9 C), temperature source Oral, resp. rate 20, height 6' 1.5" (1.867 m), weight 116.348 kg (256 lb 8 oz), SpO2 97.00%. He was awake and alert. He has a skin infection improved  Disposition: Home. He will also follow with Dr. Kristian Covey    Follow-up Information    Follow up with FAGAN,ROY in 2 weeks.   Contact information:   [address] [place] [address] [phone]          Signed: Fredirick Maudlin 08/08/2011, 9:25 AM

## 2011-09-09 ENCOUNTER — Encounter: Payer: Self-pay | Admitting: Orthopedic Surgery

## 2011-09-22 ENCOUNTER — Ambulatory Visit (INDEPENDENT_AMBULATORY_CARE_PROVIDER_SITE_OTHER): Payer: Medicare Other | Admitting: Orthopedic Surgery

## 2011-09-22 ENCOUNTER — Encounter: Payer: Self-pay | Admitting: Orthopedic Surgery

## 2011-09-22 VITALS — BP 130/82 | Ht 72.0 in | Wt 256.0 lb

## 2011-09-22 DIAGNOSIS — M1610 Unilateral primary osteoarthritis, unspecified hip: Secondary | ICD-10-CM

## 2011-09-22 DIAGNOSIS — M169 Osteoarthritis of hip, unspecified: Secondary | ICD-10-CM | POA: Insufficient documentation

## 2011-09-22 DIAGNOSIS — R52 Pain, unspecified: Secondary | ICD-10-CM

## 2011-09-22 DIAGNOSIS — M5136 Other intervertebral disc degeneration, lumbar region: Secondary | ICD-10-CM | POA: Insufficient documentation

## 2011-09-22 DIAGNOSIS — M5137 Other intervertebral disc degeneration, lumbosacral region: Secondary | ICD-10-CM

## 2011-09-22 LAB — BASIC METABOLIC PANEL
CO2: 27
CO2: 31
Calcium: 8.1 — ABNORMAL LOW
Chloride: 101
Chloride: 103
Chloride: 105
Creatinine, Ser: 1.43
GFR calc Af Amer: 46 — ABNORMAL LOW
GFR calc Af Amer: 59 — ABNORMAL LOW
Glucose, Bld: 109 — ABNORMAL HIGH
Potassium: 5.5 — ABNORMAL HIGH
Sodium: 137

## 2011-09-22 LAB — DIFFERENTIAL
Basophils Absolute: 0
Basophils Relative: 0
Eosinophils Absolute: 0.4
Eosinophils Absolute: 0.4
Eosinophils Relative: 7 — ABNORMAL HIGH
Eosinophils Relative: 7 — ABNORMAL HIGH
Lymphs Abs: 1.2
Monocytes Absolute: 0.6
Monocytes Relative: 7
Neutro Abs: 3.8

## 2011-09-22 LAB — CBC
HCT: 32 — ABNORMAL LOW
HCT: 36.6 — ABNORMAL LOW
Hemoglobin: 11.2 — ABNORMAL LOW
Hemoglobin: 12.9 — ABNORMAL LOW
MCHC: 35
MCV: 88.9
MCV: 89.4
RBC: 4.11 — ABNORMAL LOW
RDW: 14.2
WBC: 6

## 2011-09-22 LAB — URINALYSIS, ROUTINE W REFLEX MICROSCOPIC
Glucose, UA: NEGATIVE
Hgb urine dipstick: NEGATIVE
Protein, ur: NEGATIVE

## 2011-09-22 MED ORDER — ACETAMINOPHEN-CODEINE 300-30 MG PO TABS: 1.0000 | ORAL_TABLET | ORAL | Status: AC | PRN

## 2011-09-22 NOTE — Progress Notes (Signed)
New patient  Referred by Dr Channing Mutters   Chief complaint pain, LEFT hip.  76 year old male with history of lumbar disc disease had a recent epidural injection, which relieved much of his RIGHT leg pain presents with LEFT hip pain radiating to the LEFT knee associated with giving out of the LEFT leg and groin pain. Pain seemed to come on gradually with no injury. He describes it as sharp, stabbing, constant. He rates his pain 7/10. The pain is worse when he is getting up from a seated position. Pain has been present for the last 3 months.  Review of systems blurred vision, eyes, order, the skin has poor healing, and itching, and associated numbness and tingling are noted. Easy bleeding and bruising with shortness of breath denied.  Physical Exam(12) GENERAL: normal development   CDV: pulses are normal   Skin: normal  Lymph: nodes were not palpable/normal  Psychiatric: awake, alert and oriented  Neuro: normal sensation  MSK gait limp favors left leg  1 left hip flexion no pain , pain occurs with EXT ROT and ABD  2 motor exam 5/5 3 stability is normal  4 no palpable tenderness  Imaging  I have reviewed the following images lumbar spine. Complete dated July of 2012 multilevel lumbar spondylosis, worse at L4, and 5, and L2, and 3, lumbar spine MRI July of 2012 impression RIGHT paracentral and lateral recess protrusion, L2, and 3, mild central canal narrowing, L3 and 4 secondary to disc bulge, moderate to moderately severe bilateral foraminal narrowing at L4 and 5, worse on the LEFT due to a disc bulge. Moderate bilateral foraminal narrowing at L5 and S1.  Hip films and LEFT dated July 2012 moderate degenerative joint disease LEFT hip, the hip film shows that there is some joint space narrowing, but the patient is definitely not bone to bone.  Assessment: I think the patient has 2 problems, and basically, has a hip spine syndrome. He has moderate degenerative joint disease of the LEFT hip with  an L4-L5, bilateral foraminal narrowing, which is greater than 50% and could be affecting his LEFT leg as well. He did not move well when he was in our office. I do not think he can tolerate rehabilitation after hip replacement.    Plan: Recommend further control of his back pain and back symptoms prior to hip surgery. He is sent back to the neurosurgeon for further management of his back pain

## 2011-09-22 NOTE — Patient Instructions (Signed)
Go back to see Dr. Channing Mutters for back pain, we have to get your back problems evaluated before hip surgery.

## 2011-10-12 LAB — DIFFERENTIAL
Basophils Relative: 0
Eosinophils Absolute: 0.1
Eosinophils Relative: 1
Lymphs Abs: 0.9
Monocytes Relative: 5
Neutrophils Relative %: 80 — ABNORMAL HIGH

## 2011-10-12 LAB — BASIC METABOLIC PANEL
BUN: 28 — ABNORMAL HIGH
CO2: 23
Chloride: 103
Creatinine, Ser: 1.77 — ABNORMAL HIGH
Potassium: 5.4 — ABNORMAL HIGH

## 2011-10-12 LAB — URINALYSIS, ROUTINE W REFLEX MICROSCOPIC
Bilirubin Urine: NEGATIVE
Nitrite: NEGATIVE
Urobilinogen, UA: 0.2

## 2011-10-12 LAB — CBC
HCT: 37.1 — ABNORMAL LOW
MCHC: 33.5
MCV: 90.8
Platelets: 152

## 2011-11-24 ENCOUNTER — Telehealth: Payer: Self-pay | Admitting: Orthopedic Surgery

## 2011-11-25 NOTE — Telephone Encounter (Signed)
Patient came in (11/24/11) asking for his MRI films that he had brought in for his 09/22/11 appointment here. They were from [facility], ordered by Dr.Roy.    - I called him back to relay that he can request a copy through [facility].  He prefers to have [place] re-check to see if we have them here.

## 2011-12-15 ENCOUNTER — Encounter: Payer: Self-pay | Admitting: Orthopedic Surgery

## 2011-12-15 NOTE — Telephone Encounter (Signed)
Patient came in about the films.  As previously advised, he may pick up copies of films at [facility], including any done at [facility].  He also signed release for copies of medical records; he states he is seeking a 2nd opinion.    I called [facility] department for him; spoke with [facility].  She will have films ready for him to pick up there, tomorrow 12/16/11. Patient aware.  He received the requested copies of records today.

## 2011-12-17 ENCOUNTER — Telehealth: Payer: Self-pay | Admitting: Orthopedic Surgery

## 2011-12-17 NOTE — Telephone Encounter (Signed)
Patient had come in 12/16/11, per previous note.  Authorization for release of medical information was obtained.  He called back to relay that it is [employer] that he is requesting an appointment for, with Dr.Aluisio.  He has received the medical records, however [facility] said they did not receive the office notes (he had 1 visit here only.)  Notes faxed to # [phone].  Patient aware.

## 2012-03-08 ENCOUNTER — Other Ambulatory Visit: Payer: Self-pay | Admitting: Neurosurgery

## 2012-03-08 DIAGNOSIS — M47816 Spondylosis without myelopathy or radiculopathy, lumbar region: Secondary | ICD-10-CM

## 2012-03-09 ENCOUNTER — Ambulatory Visit
Admission: RE | Admit: 2012-03-09 | Discharge: 2012-03-09 | Disposition: A | Discharge status: Home / Self Care | Payer: Medicare Other | Source: Ambulatory Visit | Attending: Neurosurgery | Admitting: Neurosurgery

## 2012-03-09 DIAGNOSIS — M47816 Spondylosis without myelopathy or radiculopathy, lumbar region: Secondary | ICD-10-CM

## 2012-03-09 MED ORDER — METHYLPREDNISOLONE ACETATE 40 MG/ML INJ SUSP (RADIOLOG
120.0000 mg | Freq: Once | INTRAMUSCULAR | Status: AC
  Administered 2012-03-09: 120 mg via EPIDURAL

## 2012-03-09 MED ORDER — IOHEXOL 180 MG/ML  SOLN
1.0000 mL | Freq: Once | INTRAMUSCULAR | Status: AC | PRN
  Administered 2012-03-09: 1 mL via EPIDURAL

## 2012-03-09 NOTE — Discharge Instructions (Signed)
 Post Procedure Spinal Discharge Instruction Sheet  1. You may resume a regular diet and any medications that you routinely take (including pain medications).  2. No driving day of procedure.  3. Light activity throughout the rest of the day.  Do not do any strenuous work, exercise, bending or lifting.  The day following the procedure, you can resume normal physical activity but you should refrain from exercising or physical therapy for at least three days thereafter.   Common Side Effects:   Headaches- take your usual medications as directed by your physician.  Increase your fluid intake.  Caffeinated beverages may be helpful.  Lie flat in bed until your headache resolves.   Restlessness or inability to sleep- you may have trouble sleeping for the next few days.  Ask your referring physician if you need any medication for sleep.   Facial flushing or redness- should subside within a few days.   Increased pain- a temporary increase in pain a day or two following your procedure is not unusual.  Take your pain medication as prescribed by your referring physician.   Leg cramps  Please contact our office at [phone] for the following symptoms:  Fever greater than 100 degrees.  Headaches unresolved with medication after 2-3 days.  Increased swelling, pain, or redness at injection site.  Thank you for visiting our office.

## 2012-04-14 ENCOUNTER — Encounter: Payer: Self-pay | Admitting: Internal Medicine

## 2012-04-28 ENCOUNTER — Ambulatory Visit (INDEPENDENT_AMBULATORY_CARE_PROVIDER_SITE_OTHER): Payer: Medicare Other | Admitting: Internal Medicine

## 2012-04-28 ENCOUNTER — Encounter: Payer: Self-pay | Admitting: Internal Medicine

## 2012-04-28 VITALS — BP 168/63 | HR 61 | Wt 243.0 lb

## 2012-04-28 DIAGNOSIS — I10 Essential (primary) hypertension: Secondary | ICD-10-CM

## 2012-04-28 NOTE — Progress Notes (Addendum)
HPI Patient being evaluated for back surgery.  Referred for preop risk stratification as outpatient EKG showed Q waves in V1, V2.  Last EKG done years ago.  Saw a cardiologist 50 years ago for ? Rhythm problem.  Told, at time, he had no problem.  Patient denies CP.  Breathing is good.  No PND.    No CP  Breathing is good.  No PND> Activity has been limited since November because of back, hips.  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  BP usually good.  Up today. Sugars usually in 100s.  No Known Allergies  Current Outpatient Prescriptions  Medication Sig Dispense Refill  . Acetaminophen-Codeine (TYLENOL/CODEINE #3) 300-30 MG per tablet Take 1 tablet by mouth every 4 (four) hours as needed for pain.  40 tablet  5  . amLODipine (NORVASC) 5 MG tablet Take 5 mg by mouth daily.        . enalapril (VASOTEC) 10 MG tablet       . fenofibrate 160 MG tablet Take 160 mg by mouth daily.        . fish oil-omega-3 fatty acids 1000 MG capsule Take 1 g by mouth 3 (three) times daily.        . fluocinonide (LIDEX) 0.05 % cream Apply topically 2 (two) times daily.  30 g  1  . gabapentin (NEURONTIN) 600 MG tablet Take 600 mg by mouth 3 (three) times daily.        . Multiple Vitamin (ONE-A-DAY MENS PO) Take 1 tablet by mouth daily.        . ONE TOUCH ULTRA TEST test strip       . pioglitazone-glimepiride (DUETACT) 30-2 MG per tablet Take 1 tablet by mouth daily with breakfast.        . rivastigmine (EXELON) 3 MG capsule Take 3 mg by mouth 2 (two) times daily.          Past Medical History  Diagnosis Date  . Hypertension   . Diabetes mellitus   . Cancer   . Back pain     Past Surgical History  Procedure Date  . Back surgery   . Prostate surgery     with seed implants  . Cholecystectomy   . Vasectomy     Family History  Problem Relation Age of Onset  . Asthma    . Diabetes      History   Social History  . Marital Status: Married    Spouse Name:  N/A    Number of Children: N/A  . Years of Education: college   Occupational History  . retired    Social History Main Topics  . Smoking status: Never Smoker   . Smokeless tobacco: Not on file  . Alcohol Use: No  . Drug Use: No  . Sexually Active: No   Other Topics Concern  . Not on file   Social History Narrative  . No narrative on file    Review of Systems:  All systems reviewed.  They are negative to the above problem except as previously stated.  Vital Signs: BP 168/63  Pulse 61  Wt 243 lb (110.224 kg) Repeat BP 135/86. Physical Exam Patient is in NAD HEENT:  Normocephalic, atraumatic. EOMI, PERRLA.  Neck: JVP is normal. No thyromegaly. No bruits.  Lungs: clear to auscultation. No rales no wheezes.  Heart: Regular rate and rhythm. Normal S1, S2. No S3.   No significant murmurs. PMI not  displaced.  Abdomen:  Supple, nontender. Normal bowel sounds. No masses. No hepatomegaly.  Extremities:   Good distal pulses throughout. No lower extremity edema.  Musculoskeletal :moving all extremities.  Neuro:   alert and oriented x3.  CN II-XII grossly intact.  EKG:  Sinus bradycardia  58 bpm.   Assessment and Plan:  1.  Preop risk evaluation Patient is a 77 year old with no prior history of CAD  Now being evaluated for back surgery No symptoms of agina  Patient was active until last November, then limited by back I am not convinced of active cardiac problems.  Outside EKG with Q waves in V1, V2.  Repeat today with Q wave in V1.  Most likely difference due to lead placement.  I am not convinced patient had prior event.   Cardiac exam unrmarkable.  BP better on recheck.    Overall I think patient is at low risk for major cardiac event.  OK to proceed with surgery without further testing.  2.  HTN.  Repeat shows good control.  Will need to be followed.  3. Dyslpidemia.  Followed by [facility].  On fenofibrate

## 2012-06-15 ENCOUNTER — Other Ambulatory Visit: Payer: Self-pay | Admitting: Neurosurgery

## 2012-06-15 ENCOUNTER — Other Ambulatory Visit: Payer: Self-pay

## 2012-06-15 DIAGNOSIS — M4716 Other spondylosis with myelopathy, lumbar region: Secondary | ICD-10-CM

## 2012-06-16 ENCOUNTER — Other Ambulatory Visit: Payer: Self-pay | Admitting: Neurosurgery

## 2012-06-16 ENCOUNTER — Ambulatory Visit
Admission: RE | Admit: 2012-06-16 | Discharge: 2012-06-16 | Disposition: A | Discharge status: Home / Self Care | Payer: Medicare Other | Source: Ambulatory Visit

## 2012-06-16 DIAGNOSIS — M4716 Other spondylosis with myelopathy, lumbar region: Secondary | ICD-10-CM

## 2012-07-05 ENCOUNTER — Ambulatory Visit (HOSPITAL_COMMUNITY)
Admission: RE | Admit: 2012-07-05 | Discharge: 2012-07-05 | Disposition: A | Discharge status: Home / Self Care | Payer: Medicare Other | Source: Ambulatory Visit | Attending: Internal Medicine | Admitting: Internal Medicine

## 2012-07-05 DIAGNOSIS — E119 Type 2 diabetes mellitus without complications: Secondary | ICD-10-CM | POA: Insufficient documentation

## 2012-07-05 DIAGNOSIS — M6281 Muscle weakness (generalized): Secondary | ICD-10-CM | POA: Insufficient documentation

## 2012-07-05 DIAGNOSIS — IMO0001 Reserved for inherently not codable concepts without codable children: Secondary | ICD-10-CM | POA: Insufficient documentation

## 2012-07-05 DIAGNOSIS — I10 Essential (primary) hypertension: Secondary | ICD-10-CM | POA: Insufficient documentation

## 2012-07-05 DIAGNOSIS — M545 Low back pain, unspecified: Secondary | ICD-10-CM | POA: Insufficient documentation

## 2012-07-05 DIAGNOSIS — M4716 Other spondylosis with myelopathy, lumbar region: Secondary | ICD-10-CM | POA: Insufficient documentation

## 2012-07-05 DIAGNOSIS — R269 Unspecified abnormalities of gait and mobility: Secondary | ICD-10-CM | POA: Insufficient documentation

## 2012-07-05 NOTE — Evaluation (Addendum)
Physical Therapy Evaluation  Patient Details  Name: Manuel [name] MRN: [id number] Date of Birth: [birth year]  Today's Date: 07/06/2012 Time: 1315-1401 PT Time Calculation (min): 46 min Charges 1 eval Visit#: 1  of 12   Re-eval: 08/04/12 Assessment Diagnosis: Lumbar spondylosis w/myelopathy Surgical Date: 04/18/12 Next MD Visit: Dr. Channing Mutters - unscheduled  Authorization: Medicare  Authorization Time Period:    Authorization Visit#: 1  of 10    Past Medical History:  Past Medical History  Diagnosis Date  . Hypertension   . Diabetes mellitus   . Cancer   . Back pain    Past Surgical History:  Past Surgical History  Procedure Date  . Back surgery   . Prostate surgery     with seed implants  . Cholecystectomy   . Vasectomy     Subjective Symptoms/Limitations Symptoms: Lumbar Spondylosis w/back surgery on April 18, 2012.  PMH: back surgery 13 years ago. Plans on having a back fusion Pertinent History: Pt is referred today to for low back pain and reports that he has to have therapy before he has a back fusion.  His c/co's are pain with transitional movements (especially sit to stand), impaired balance, decreased gait speed and impaired gait mechanics.  Pt reports that he very depressed and states "I do not feel like a man, I am just a pitiful man."  How long can you sit comfortably?: no difficulty. Pain w/sit to stand How long can you stand comfortably?: able to stand in church and move for about 10-15 minutes How long can you walk comfortably?: 10-15 minutes Patient Stated Goals: "I want to give it my best effort to heal my muscles or whatever, but I think I am going to need the surgery because I know what my lifestyle entails" Pain Assessment Currently in Pain?: No/denies (when sitting.  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  Comments: He is very active in his church [employer]).   Cognition/Observation Observation/Other Assessments Observations: moderate atrophy to BLE.  Abdominal hernia to L side with significant gastric obesisty.  Impaired flexibility to BLE quadriceps, hamstrings, hip flexors and psoas.   Sensation/Coordination/Flexibility/Functional Tests Functional Tests Functional Tests: ODI: 64%  Assessment RLE AROM (degrees) RLE Overall AROM Comments: decreased hip internal rotation by 50% compared to normal.  Pt reports significant pain with active motion RLE Strength RLE Overall Strength Comments: all measurments taken in seated position excpet hip extenision in prone Right Hip Flexion: 3/5 Right Hip Extension: 3/5 Right Hip ABduction: 3+/5 Right Hip ADduction: 3+/5 Right Knee Flexion: 5/5 Right Knee Extension: 5/5 LLE AROM (degrees) LLE Overall AROM Comments: decreased hip internal rotation by 50% compared to normal. without pain to L hip LLE Strength Left Hip Flexion: 4/5 Left Hip Extension: 3/5 Left Hip ABduction: 3+/5 Left Hip ADduction: 3+/5 Left Knee Flexion: 5/5 Left Knee Extension: 5/5 Lumbar AROM Lumbar Flexion: Decreased 50% w/significant pain and gower sign on return Lumbar Extension: Decreased 40% w/mild increased pain Lumbar - Right  Side Bend: Decreased 30% Lumbar - Left Side Bend: Decreased 30% Palpation Palpation: pain and tenderness to R ITB and anterior hip flexor region. Unable to reporduce pain to low back  region.  Mobility/Balance  Ambulation/Gait Ambulation/Gait: Yes Gait Pattern: Decreased hip/knee flexion - right;Decreased hip/knee flexion - left;Decreased stride length;Antalgic (decreased pelvic rotation) Posture/Postural Control Posture/Postural Control: Postural limitations Postural Limitations: upper and lower cross syndrome Static Standing Balance Static Standing - Comment/# of Minutes: pt has significant pain when transfering to attempt tandem and SLS to R hip Single Leg Stance - Right Leg: 0  Single Leg Stance - Left Leg: 0  Tandem Stance - Right Leg: 5  (impaired hip strategy) Tandem Stance - Left Leg: 3  Rhomberg - Eyes Opened: 10  Rhomberg - Eyes Closed: 10    Exercise/Treatments Seated: Heel and Toe Roll in and outs 2x10 sec holds Supine: Ab Set 2x10 sec holds after NMR PFC 3x10 sec holds Prone: Multifidus 2x10 sec holds after NMR   Physical Therapy Assessment and Plan PT Assessment and Plan Clinical Impression Statement: Manuel [name] is referred to PT for LBP which is required by his insurance before he can recieve another surgery.  He is required to have at least 3 months of PT before his insurance will allow a spinal fusion.  Currently he feels that he will not get better until he has the fusion.  However with examination I feel that at least 50% of his pain is coming from muscle pain due to lack of hip IR and tightness to his hip flexor and psoas musculature.  Pt will benefit from skilled therapeutic intervention in order to improve on the following deficits: Abnormal gait;Decreased balance;Decreased activity tolerance;Pain;Obesity;Decreased strength;Decreased range of motion;Impaired tone;Increased muscle spasms;Impaired perceived functional ability;Decreased mobility;Improper body mechanics;Impaired flexibility Rehab Potential: Fair Clinical Impairments Affecting Rehab Potential: secondary to wanting back surgery. PT Frequency: Min 3X/week PT Duration: 4 weeks PT  Treatment/Interventions: Gait training;Stair training;Functional mobility training;Therapeutic activities;Therapeutic exercise;Balance training;Neuromuscular re-education;Patient/family education;Manual techniques PT Plan: No Modalities HX of prostate cancer. Try to decrease patients pain with exercises and focus on strengthening core musculature and improve LE balance.  Educate patient on importance of HEP and continue to strengthen muscles to prepare for possible surgery.      Goals Home Exercise Program Pt will Perform Home Exercise Program: Independently PT Short Term Goals Time to Complete Short Term Goals: 2 weeks PT Short Term Goal 1: Pt will report pain less than 6/10 with transitional movements (sit to stand) PT Short Term Goal 2: Pt will improve core strength to WNL in order to activiate independently. PT Short Term Goal 3: Pt will improve LE balance and demonstrate R and L tandem stance x20 sec on static surface.  PT Long Term Goals Time to Complete Long Term Goals: 4 weeks PT Long Term Goal 1: Pt will report pain less than 3/10 with transitional movements for improved QOL. PT Long Term Goal 2: Pt will improve LE and core strength to WNL in order to tolerate standing and walking for greater than 30 minutes to continue with church activities.  Long Term Goal 3: Pt will improve his dynamic balance and demonstrate independent outdoor ambulation with appropriate gait mechanics.  Long Term Goal 4: Pt will improve his LE flexibility in order to demonstrate appropriate body mechanics to decrease risk of secondary injury.   Problem List Patient Active Problem List  Diagnosis  . Acute on chronic renal failure  . BPH (benign prostatic hyperplasia)  . Prostate  cancer  . Hypertension  . Diabetes mellitus type 2 with complications  . Lumbar disc disease  . Degenerative arthritis of hip  . Degenerative disc disease, lumbar  . Lumbago  . Spondylosis with myelopathy, lumbar region    PT  Plan of Care PT Home Exercise Plan: see scanned report PT Patient Instructions: importance of HEP.  Training core muscles will help with recovery of possible surgery.  Explained pain is likely from muscles as well as the spine as patient has most pain with transitional movements and denies pain at rest.  Consulted and Agree with Plan of Care: Patient  GP Functional Assessment Tool Used: ODI, MMT and ROM Functional Limitation: Self care Self Care Current Status (Z6109): At least 60 percent but less than 80 percent impaired, limited or restricted Self Care Goal Status (U0454): At least 20 percent but less than 40 percent impaired, limited or restricted  Daking Westervelt, PT 07/06/2012, 11:12 AM  Physician Documentation Your signature is required to indicate approval of the treatment plan as stated above.  Please sign and either send electronically or make a copy of this report for your files and return this physician signed original.   Please mark one 1.__approve of plan  2. ___approve of plan with the following conditions.   ______________________________                                                          _____________________ Physician Signature                                                                                                             Date

## 2012-07-08 ENCOUNTER — Ambulatory Visit (HOSPITAL_COMMUNITY)
Admission: RE | Admit: 2012-07-08 | Discharge: 2012-07-08 | Disposition: A | Discharge status: Home / Self Care | Payer: Medicare Other | Source: Ambulatory Visit | Attending: Physical Therapy | Admitting: Physical Therapy

## 2012-07-08 NOTE — Progress Notes (Addendum)
Physical Therapy Treatment Patient Details  Name: Manuel [name] MRN: [id number] Date of Birth: [birth year]  Today's Date: 07/08/2012 Time: 1300-1340 PT Time Calculation (min): 40 min Charges: 23' Self Care, 12' Manual, 5' TE Visit#: 2  of 12   Re-eval: 08/04/12    Authorization: Medicare  Authorization Time Period:    Authorization Visit#: 2  of 10    Subjective: Symptoms/Limitations Symptoms: Pt reports that he wants to be fixed now.  he is tired of waiting on his secondary insurance to allow him to have surgery.  Pain Assessment Currently in Pain?: Yes Pain Score:  (Reports pain with transisitonal movements) Pain Location: Hip Pain Orientation: Right  Exercise/Treatments Supine Bridge: 10 reps Other Supine Lumbar Exercises: windshild wipers x10 LTR: 5x  Manual Therapy Manual Therapy: Joint mobilization Joint Mobilization: Grade I-II PA R hip mobs, R LE long leg distraction w/occisilations, hip IR and ER PROM.  manual x15 minutes.   Physical Therapy Assessment and Plan PT Assessment and Plan Clinical Impression Statement: Discussed in length about importance of HEP and exercises.  Continues to have difficulty with transistional movements, but reports a reduction in pain at the end of treatment and states that he only had warmth and some burning pain to the front of his thigh and hip.  Held excessive exercises today to ensure patient ease and focused on decreasing pain.  PT Plan: No Modalities HX of prostate cancer. Try to decrease patients pain with manual techniques and exercises and focus on strengthening core musculature and improve LE balance.     Goals    Problem List Patient Active Problem List  Diagnosis  . Acute on chronic renal failure  . BPH (benign prostatic hyperplasia)  . Prostate cancer  . Hypertension  . Diabetes mellitus type 2 with complications  . Lumbar disc disease  . Degenerative arthritis of hip  . Degenerative disc disease, lumbar  . Lumbago   . Spondylosis with myelopathy, lumbar region    PT Plan of Care PT Home Exercise Plan: see scanned report PT Patient Instructions: Discussed in length about attempting PT for 1 month and stressed the importance of his HEP that he can complete in his bed or while sitting.  Discussed with him likely hood of at least half of his pain coming from his muscles and not from his back as he has recieved an epidural to his back and back surgery and his pain has not been relieved.  Discussed that insurance will not pay unless he has 3 failed months of PT or 6 months past previous surgery.  Consulted and Agree with Plan of Care: Patient  GP    Cheney Gosch 07/08/2012, 1:58 PM

## 2012-07-11 ENCOUNTER — Ambulatory Visit (HOSPITAL_COMMUNITY)
Admission: RE | Admit: 2012-07-11 | Discharge: 2012-07-11 | Disposition: A | Discharge status: Home / Self Care | Payer: Medicare Other | Source: Ambulatory Visit | Attending: *Deleted | Admitting: *Deleted

## 2012-07-11 NOTE — Progress Notes (Signed)
Physical Therapy Treatment Patient Details  Name: Manuel [name] MRN: [id number] Date of Birth: 1935-02-08  Today's Date: 07/11/2012 Time: 7829-5621 PT Time Calculation (min): 40 min  Visit#: 3  of 12   Re-eval: 08/04/12 Charges: Therex x 20'' Manual x 15'  Authorization: Medicare  Authorization Visit#: 3  of 10    Subjective: Symptoms/Limitations Symptoms: Pt states that has the most pain when he is transitioning from sit to stand. Pain Assessment Pain Score:   2 ( 2/10 seated; 7/10 standing; 10/10 transition STS) Pain Location: Hip Pain Orientation: Right   Exercise/Treatments  Stretches Lower Trunk Rotation: 3 reps;30 seconds Seated Other Seated Lumbar Exercises: hip roll in/out x 10 Supine Bridge: 10 reps Other Supine Lumbar Exercises: windshild wipers x10  Manual Therapy Manual Therapy: Joint mobilization Joint Mobilization: R LE long leg distraction w/occisilations, hip flexion IR and ER PROM  Physical Therapy Assessment and Plan PT Assessment and Plan Clinical Impression Statement: Pt continues to be limited by hip tightness. Pt responds well to manual techniques. Pt is very guarded. Pt displays improved mobility and gait mechanics at end of session. Pt reports 0/10 pain at end of session. PT Plan: No Modalities HX of prostate cancer. Try to decrease patients pain with manual techniques and exercises and focus on strengthening core musculature and improve LE balance.      Problem List Patient Active Problem List  Diagnosis  . Acute on chronic renal failure  . BPH (benign prostatic hyperplasia)  . Prostate cancer  . Hypertension  . Diabetes mellitus type 2 with complications  . Lumbar disc disease  . Degenerative arthritis of hip  . Degenerative disc disease, lumbar  . Lumbago  . Spondylosis with myelopathy, lumbar region    PT - End of Session Activity Tolerance: Patient tolerated treatment well General Behavior During Session: [facility] for tasks  performed Cognition: [facility] for tasks performed   Seth Bake, PTA 07/11/2012, 6:27 PM

## 2012-07-13 ENCOUNTER — Ambulatory Visit (HOSPITAL_COMMUNITY)
Admission: RE | Admit: 2012-07-13 | Discharge: 2012-07-13 | Disposition: A | Discharge status: Home / Self Care | Payer: Medicare Other | Source: Ambulatory Visit | Attending: Internal Medicine | Admitting: Internal Medicine

## 2012-07-13 NOTE — Progress Notes (Signed)
Physical Therapy Treatment Patient Details  Name: Manuel [name] MRN: [id number] Date of Birth: [birth year]  Today's Date: 07/13/2012 Time: 1302-1410 PT Time Calculation (min): 68 min Visit#: 4  of 12   Re-eval: 08/04/12 Charges:  therex 35', static traction 10'   Authorization: Medicare  Authorization Visit#: 4  of 10    Subjective: Symptoms/Limitations Symptoms: Pt. states he's really sore and hurting today.  Reports it may be from the exercises, he is unsure.  Currently 8/10 and down R LE Pain Assessment Currently in Pain?: Yes Pain Score:   8 Pain Location: Back Pain Orientation: Lower;Right   Exercise/Treatments Stretches Lower Trunk Rotation: 3 reps;30 seconds Seated Other Seated Lumbar Exercises: hip roll in/out 10X10" holds Supine Bridge: 10 reps Other Supine Lumbar Exercises: windsheild Wipers with feet (INV/EV) 10 reps Prone  Other Prone Lumbar Exercises: POE, press ups 5X5" Other Prone Lumbar Exercises: heelsqueezes 10X5"   Modalities Modalities: Traction Traction Type of Traction: Lumbar Min (lbs): n/a Max (lbs): 100 Hold Time: static Rest Time: n/a Time: 10 minutes  Physical Therapy Assessment and Plan PT Assessment and Plan Clinical Impression Statement: Began prone exercises to work on extension with pain reduction; added lumbar traction today (OK with Annett Fabian, PT).  Pt. reported 0/10 pain at end of session, however still with slight pain transitioning sit to stand. PT Plan: Assess pain relief using traction next visit.     Problem List Patient Active Problem List  Diagnosis  . Acute on chronic renal failure  . BPH (benign prostatic hyperplasia)  . Prostate cancer  . Hypertension  . Diabetes mellitus type 2 with complications  . Lumbar disc disease  . Degenerative arthritis of hip  . Degenerative disc disease, lumbar  . Lumbago  . Spondylosis with myelopathy, lumbar region    PT - End of Session Activity Tolerance: Patient tolerated  treatment well General Behavior During Session: [facility] for tasks performed Cognition: [facility] for tasks performed   Lurena Nida, PTA/CLT 07/13/2012, 2:27 PM

## 2012-07-15 ENCOUNTER — Ambulatory Visit (HOSPITAL_COMMUNITY)
Admission: RE | Admit: 2012-07-15 | Discharge: 2012-07-15 | Disposition: A | Discharge status: Home / Self Care | Payer: Medicare Other | Source: Ambulatory Visit | Attending: Internal Medicine | Admitting: Internal Medicine

## 2012-07-15 NOTE — Progress Notes (Signed)
Physical Therapy Treatment Patient Details  Name: Manuel [name] MRN: [id number] Date of Birth: November 20, 1935  Today's Date: 07/15/2012 Time: 4098-1191 PT Time Calculation (min): 46 min Charges: 40' Manual, 6' TE Visit#: 5  of 12   Re-eval: 08/04/12    Authorization: Medicare  Authorization Time Period:    Authorization Visit#: 5  of 10    Subjective: Symptoms/Limitations Symptoms: He continues to come in with pain.  Said he had to take medication to help the pain and did not try any exercises. Comes in today w/cane in LUE, has significant improvement with gait mechanics and posture.  Pain Assessment Currently in Pain?: Yes  Exercise/Treatments Stretches Lower Trunk Rotation: 3 reps;30 seconds Supine Bridge: 10 reps  Manual Therapy Manual Therapy: Other (comment) Other Manual Therapy: SCS to R gluteal region (in L S/L position) w/STM to follow.  STM to R quadricep w/legs elevated over significant proximal quad TP.  Manual x40 minutes.   Physical Therapy Assessment and Plan PT Assessment and Plan Clinical Impression Statement: Today's treatment focused on reducing significant trigger points and muscle spasms to R quadricep and gluteal region.  Pt continues to do well during therapy, however pain continues to come back after therapy.  Encouraged patient to keep moving while at home to improve blood flow.  Suggested using a MHP to improve local circulation.  PT Plan: D/C traction secondary to extreme pain following day (likely due to tissue pull), assess manual treatment, continue to encourage HEP, may use Mosit Heat for pain control.     Goals    Problem List Patient Active Problem List  Diagnosis  . Acute on chronic renal failure  . BPH (benign prostatic hyperplasia)  . Prostate cancer  . Hypertension  . Diabetes mellitus type 2 with complications  . Lumbar disc disease  . Degenerative arthritis of hip  . Degenerative disc disease, lumbar  . Lumbago  . Spondylosis with  myelopathy, lumbar region    PT - End of Session Activity Tolerance: Patient tolerated treatment well General Behavior During Session: [facility] for tasks performed Cognition: [facility] for tasks performed PT Plan of Care PT Patient Instructions: Encouraged to contact massage therapist to decrease LE pain. Encouraged to continue w/cane use Consulted and Agree with Plan of Care: Patient  GP    Rosalba Totty 07/15/2012, 12:30 PM

## 2012-07-18 ENCOUNTER — Inpatient Hospital Stay (HOSPITAL_COMMUNITY)
Admission: RE | Admit: 2012-07-18 | Discharge: 2012-07-18 | Payer: Medicare Other | Source: Ambulatory Visit | Attending: *Deleted | Admitting: *Deleted

## 2012-07-18 NOTE — Progress Notes (Signed)
Physical Therapy Treatment Patient Details  Name: Manuel [name] MRN: [id number] Date of Birth: May 21, 1935  Today's Date: 07/18/2012 Visit#: 5  of 12   Re-eval: 08/04/12 Charges: No Charge  Subjective: Symptoms/Limitations Symptoms: Pt states that he wishes to d/c therapy as he has seen no change in his pain.   Physical Therapy Assessment and Plan PT Assessment and Plan Clinical Impression Statement: Pt presents to therapy to tell therapist that he will no longer be participating in PT. Pt states that he does not wish to do any tests to evaluate progress. He reports that he feels good when he leaves therapy but the pain immediately comes back. He states that he plans to have back surgery. PT Plan: Recommend D/C to HEP secondary to pt request.     Problem List Patient Active Problem List  Diagnosis  . Acute on chronic renal failure  . BPH (benign prostatic hyperplasia)  . Prostate cancer  . Hypertension  . Diabetes mellitus type 2 with complications  . Lumbar disc disease  . Degenerative arthritis of hip  . Degenerative disc disease, lumbar  . Lumbago  . Spondylosis with myelopathy, lumbar region     Functional Assessment Tool Used: Based on self-assessment. Pt not willing to participate in re-evaluation. Functional Limitation: Mobility: Walking and moving around Mobility: Walking and Moving Around Current Status [phone]): At least 60 percent but less than 80 percent impaired, limited or restricted Mobility: Walking and Moving Around Goal Status [phone]): At least 20 percent but less than 40 percent impaired, limited or restricted Mobility: Walking and Moving Around Discharge Status [phone]): At least 60 percent but less than 80 percent impaired, limited or restricted  Seth Bake, PTA 07/18/2012, 1:29 PM

## 2012-07-20 ENCOUNTER — Inpatient Hospital Stay (HOSPITAL_COMMUNITY): Admission: RE | Admit: 2012-07-20 | Payer: Medicare Other | Source: Ambulatory Visit | Admitting: Physical Therapy

## 2012-07-21 ENCOUNTER — Ambulatory Visit (HOSPITAL_COMMUNITY): Payer: Medicare Other | Admitting: *Deleted

## 2012-07-25 ENCOUNTER — Ambulatory Visit (HOSPITAL_COMMUNITY): Payer: Medicare Other | Admitting: *Deleted

## 2012-07-27 ENCOUNTER — Ambulatory Visit (HOSPITAL_COMMUNITY): Payer: Medicare Other | Admitting: Physical Therapy

## 2012-07-28 ENCOUNTER — Encounter (HOSPITAL_COMMUNITY): Payer: Self-pay | Admitting: Physical Therapy

## 2012-07-29 ENCOUNTER — Ambulatory Visit (HOSPITAL_COMMUNITY): Payer: Medicare Other | Admitting: Physical Therapy

## 2013-11-21 ENCOUNTER — Ambulatory Visit (INDEPENDENT_AMBULATORY_CARE_PROVIDER_SITE_OTHER): Payer: Medicare Other | Admitting: Orthopedic Surgery

## 2013-11-21 ENCOUNTER — Ambulatory Visit (INDEPENDENT_AMBULATORY_CARE_PROVIDER_SITE_OTHER): Payer: Medicare Other

## 2013-11-21 VITALS — BP 158/76 | Ht 72.0 in | Wt 248.0 lb

## 2013-11-21 DIAGNOSIS — M25551 Pain in right hip: Secondary | ICD-10-CM

## 2013-11-21 DIAGNOSIS — M25559 Pain in unspecified hip: Secondary | ICD-10-CM

## 2013-11-21 DIAGNOSIS — R110 Nausea: Secondary | ICD-10-CM

## 2013-11-21 MED ORDER — HYDROCODONE-ACETAMINOPHEN 5-325 MG PO TABS: 1.0000 | ORAL_TABLET | Freq: Four times a day (QID) | ORAL | Status: DC | PRN

## 2013-11-21 MED ORDER — PROMETHAZINE HCL 25 MG PO TABS: 25.0000 mg | ORAL_TABLET | Freq: Four times a day (QID) | ORAL | Status: DC | PRN

## 2013-11-21 NOTE — Patient Instructions (Signed)
Refer to Dr. Charlann Boxer for second opinion Right hip Anterior approach hip replacement.

## 2013-11-22 ENCOUNTER — Telehealth: Payer: Self-pay | Admitting: *Deleted

## 2013-11-22 ENCOUNTER — Other Ambulatory Visit: Payer: Self-pay | Admitting: *Deleted

## 2013-11-22 DIAGNOSIS — M1610 Unilateral primary osteoarthritis, unspecified hip: Secondary | ICD-10-CM

## 2013-11-22 NOTE — Progress Notes (Signed)
Patient ID: Manuel [name], male   DOB: 1935-05-14, 78 y.o.   MRN: [id number]  Chief Complaint  Patient presents with  . Follow-up    Re evaluate right hip    77 year old male status post lumbar fusion by Dr. Trey Sailors with residual sciatic nerve pain in the right lower extremity presents with pain along his right iliac crest and upper lateral groin. He failed lidocaine injection test. He presents wanting to have his right hip replaced (as a source of his current symptoms.  He says he is having difficulty walking as well. He said limping. He has pain down the back of his leg and into his knee as well but he reports separate symptoms and is familiar with his sciatic nerve symptoms and is convinced that this symptom is different. He does not have in her groin pain or upper thigh pain.  His pain is mainly in the lateral iliac crest and at the upper lateral area of his groin.  He does have a decreased range of motion difficulty sitting in the car. He says he does not use the cane or walker. He's scheduled for repeat lumbar surgery in January but wants his hip done first. He is also interested in anterior approach of surgery  Past Medical History  Diagnosis Date  . Hypertension   . Diabetes mellitus   . Cancer   . Back pain    Past Surgical History  Procedure Laterality Date  . Back surgery    . Prostate surgery      with seed implants  . Cholecystectomy    . Vasectomy      BP 158/76  Ht 6' (1.829 m)  Wt 248 lb (112.492 kg)  BMI 33.63 kg/m2 General appearance is normal, the patient is alert and oriented x3 with normal mood and affect.  He does ambulate with a limp He does not have any palpable tenderness over his hip joint her groin Flexion internal rotation does not reproduce his pain his hip is stable his motor exam is normal scans intact has good pulse there is no sensory loss  X-ray show osteoarthritis right hip  Assessment: The patient presents with hip pain and leg pain from  his lumbar disc surgery. In the lumbar injection test and says it didn't help. I am concerned he may not have true pain from his hip arthritis although it is definitely significant on x-ray. He is also interested in anterior protein surgery which I do not perform  We will send him to Dr. Charlann Boxer for assessment and treatment as needed.

## 2013-11-22 NOTE — Telephone Encounter (Signed)
Referral and office notes faxed to [facility]. Awaiting appointment. 

## 2013-12-13 ENCOUNTER — Telehealth: Payer: Self-pay | Admitting: Orthopedic Surgery

## 2013-12-13 NOTE — Telephone Encounter (Signed)
Please call [facility]  [phone] ext [phone].  She has a question about the referral for [employer]

## 2013-12-14 NOTE — Telephone Encounter (Signed)
Spoke with Tresa Endo at [facility] and she informed me that the patient has seen Dr. Lequita Halt in the past for his hip. She states they were waiting for him to call back for surgery. She said she would call him and see if he was ok with seeing Dr. Lequita Halt.

## 2013-12-28 HISTORY — PX: BACK SURGERY: SHX140

## 2014-03-29 ENCOUNTER — Other Ambulatory Visit: Payer: Self-pay | Admitting: Orthopedic Surgery

## 2014-04-04 ENCOUNTER — Encounter (HOSPITAL_COMMUNITY): Payer: Self-pay | Admitting: Pharmacy Technician

## 2014-04-09 NOTE — Pre-Procedure Instructions (Signed)
BRYANN MCNEALY  04/09/2014   Your procedure is scheduled on:  Friday, April 24th   Report to [facility]  2 * 3 at 7:45 AM.   Call this number if you have problems the morning of surgery: [phone]   Remember:   Do not eat food or drink liquids after midnight Thursday.   Take these medicines the morning of surgery with A SIP OF WATER: Norvasc, Hydrocodone   Do not wear jewelry - no rings or watches.  Do not wear lotions or colognes. You may NOT wear deodorant.   Men may shave face and neck.   Do not bring valuables to the hospital.  [facility] is not responsible for any belongings or valuables.               Contacts, dentures or bridgework may not be worn into surgery.  Leave suitcase in the car. After surgery it may be brought to your room.  For patients admitted to the hospital, discharge time is determined by your treatment team.    Name and phone number of your driver:    Special Instructions: "Preparing for Surgery" Instruction Sheet.   Please read over the following fact sheets that you were given: Pain Booklet, Coughing and Deep Breathing, MRSA Information and Surgical Site Infection Prevention

## 2014-04-10 ENCOUNTER — Encounter (HOSPITAL_COMMUNITY)
Admission: RE | Admit: 2014-04-10 | Discharge: 2014-04-10 | Disposition: A | Discharge status: Home / Self Care | Payer: Medicare Other | Source: Ambulatory Visit | Attending: Orthopedic Surgery | Admitting: Orthopedic Surgery

## 2014-04-10 ENCOUNTER — Ambulatory Visit (HOSPITAL_COMMUNITY)
Admission: RE | Admit: 2014-04-10 | Discharge: 2014-04-10 | Disposition: A | Discharge status: Home / Self Care | Payer: Medicare Other | Source: Ambulatory Visit | Attending: Orthopedic Surgery | Admitting: Orthopedic Surgery

## 2014-04-10 ENCOUNTER — Encounter (HOSPITAL_COMMUNITY): Payer: Self-pay

## 2014-04-10 DIAGNOSIS — Z01818 Encounter for other preprocedural examination: Secondary | ICD-10-CM | POA: Insufficient documentation

## 2014-04-10 DIAGNOSIS — M259 Joint disorder, unspecified: Secondary | ICD-10-CM | POA: Insufficient documentation

## 2014-04-10 DIAGNOSIS — Z01812 Encounter for preprocedural laboratory examination: Secondary | ICD-10-CM | POA: Insufficient documentation

## 2014-04-10 LAB — COMPREHENSIVE METABOLIC PANEL
ALBUMIN: 4 g/dL (ref 3.5–5.2)
ALK PHOS: 39 U/L (ref 39–117)
ALT: 13 U/L (ref 0–53)
AST: 18 U/L (ref 0–37)
BILIRUBIN TOTAL: 0.3 mg/dL (ref 0.3–1.2)
BUN: 36 mg/dL — ABNORMAL HIGH (ref 6–23)
CHLORIDE: 106 meq/L (ref 96–112)
CO2: 26 meq/L (ref 19–32)
CREATININE: 2.25 mg/dL — AB (ref 0.50–1.35)
Calcium: 9.4 mg/dL (ref 8.4–10.5)
GFR calc Af Amer: 31 mL/min — ABNORMAL LOW (ref >90)
GFR, EST NON AFRICAN AMERICAN: 26 mL/min — AB (ref >90)
Glucose, Bld: 104 mg/dL — ABNORMAL HIGH (ref 70–99)
POTASSIUM: 6.3 meq/L — AB (ref 3.7–5.3)
Sodium: 144 mEq/L (ref 137–147)
Total Protein: 7.2 g/dL (ref 6.0–8.3)

## 2014-04-10 LAB — TYPE AND SCREEN
ABO/RH(D): O POS
Antibody Screen: NEGATIVE

## 2014-04-10 LAB — URINALYSIS, ROUTINE W REFLEX MICROSCOPIC
Bilirubin Urine: NEGATIVE
GLUCOSE, UA: NEGATIVE mg/dL
Hgb urine dipstick: NEGATIVE
KETONES UR: NEGATIVE mg/dL
LEUKOCYTES UA: NEGATIVE
Nitrite: NEGATIVE
PH: 6 (ref 5.0–8.0)
Protein, ur: NEGATIVE mg/dL
SPECIFIC GRAVITY, URINE: 1.017 (ref 1.005–1.030)
Urobilinogen, UA: 1 mg/dL (ref 0.0–1.0)

## 2014-04-10 LAB — SURGICAL PCR SCREEN
MRSA, PCR: NEGATIVE
STAPHYLOCOCCUS AUREUS: NEGATIVE

## 2014-04-10 LAB — CBC
HCT: 39 % (ref 39.0–52.0)
Hemoglobin: 12.6 g/dL — ABNORMAL LOW (ref 13.0–17.0)
MCH: 31.3 pg (ref 26.0–34.0)
MCHC: 32.3 g/dL (ref 30.0–36.0)
MCV: 97 fL (ref 78.0–100.0)
PLATELETS: 201 10*3/uL (ref 150–400)
RBC: 4.02 MIL/uL — ABNORMAL LOW (ref 4.22–5.81)
RDW: 14.2 % (ref 11.5–15.5)
WBC: 5 10*3/uL (ref 4.0–10.5)

## 2014-04-10 LAB — PROTIME-INR
INR: 0.96 (ref 0.00–1.49)
Prothrombin Time: 12.6 seconds (ref 11.6–15.2)

## 2014-04-10 LAB — APTT: aPTT: 25 seconds (ref 24–37)

## 2014-04-10 LAB — ABO/RH: ABO/RH(D): O POS

## 2014-04-10 NOTE — Progress Notes (Addendum)
Called Dr. Wynelle Link office concerning todays lab results.  I also called Mr. Yanko again, verifying that he has had no problem with his kidneys that he knows of.   DA Received follow up call from [place], [place] for Dr. Wynelle Link.  He has made Mr. Ekholm PCP aware of lab results.  Plan is to have blood redrawn @ Dr. Ria Comment office, await results and go from there.  Dr. Willey Blade had stated that pt has had some renal insufficiency, but not like today's lab results.  DA

## 2014-04-10 NOTE — Progress Notes (Signed)
Requested notes from [place].  DA

## 2014-04-11 ENCOUNTER — Encounter (HOSPITAL_COMMUNITY)
Admission: RE | Admit: 2014-04-11 | Discharge: 2014-04-11 | Disposition: A | Discharge status: Home / Self Care | Payer: Medicare Other | Source: Ambulatory Visit | Attending: Orthopedic Surgery | Admitting: Orthopedic Surgery

## 2014-04-11 DIAGNOSIS — Z01812 Encounter for preprocedural laboratory examination: Secondary | ICD-10-CM | POA: Insufficient documentation

## 2014-04-11 LAB — BASIC METABOLIC PANEL
BUN: 35 mg/dL — ABNORMAL HIGH (ref 6–23)
CO2: 23 mEq/L (ref 19–32)
Calcium: 9.1 mg/dL (ref 8.4–10.5)
Chloride: 104 mEq/L (ref 96–112)
Creatinine, Ser: 2.08 mg/dL — ABNORMAL HIGH (ref 0.50–1.35)
GFR, EST AFRICAN AMERICAN: 34 mL/min — AB (ref >90)
GFR, EST NON AFRICAN AMERICAN: 29 mL/min — AB (ref >90)
Glucose, Bld: 102 mg/dL — ABNORMAL HIGH (ref 70–99)
POTASSIUM: 5.3 meq/L (ref 3.7–5.3)
SODIUM: 141 meq/L (ref 137–147)

## 2014-04-11 NOTE — Progress Notes (Signed)
Seizure chart review: Patient is a 79 year old male scheduled for right total hip replacement on 04/20/14 by Dr. Wynelle Link.  History includes nonsmoker, hypertension, diabetes mellitus type 2, prostate cancer status post radioactive seed implant, cataract extraction, back surgery, cholecystectomy. He was admitted in 2012 for acute on chronic renal failure and was evaluated by Dr. Lowanda Foster. (At that time, his baseline Cr was recorded as 1.8-2.0, but Cr then peaked at 8.33, possible due to obstructive uropathy.) PCP is Dr. Asencion Noble. He is not followed routinely by a cardiologist but was evaluated by Dr. Dorris Carnes in 04/2012 for a preoperative evaluation without testing ordered at that time.  EKG on 04/20/14 showed SB, possible anterior infarct (age undetermined).  His EKG appears stable since at least 08/03/11.  Carotid duplex on 06/26/11 showed < 50% bilateral ICA stenosis.  CXR on 04/10/14 showed no active cardiopulmonary disease.  Preoperative labs noted.  H/H 12.6/39.0.  PT/PTT and UA WNL. BUN/Cr 36/2.25 with K+ 6.3 yesterday.  Repeat BMET today showed a K+ 5.3, BUN 35, Cr 2.08. Arlee Muslim, PA-C with Dr. Wynelle Link is already aware of labs results and is in communication with Dr Willey Blade.  Dr. Willey Blade is off today, but repeat labs were already faxed to his office with plans for Drew to follow-up for any additional pre-operative recommendations.    [facility]/Anesthesiology Phone [phone] 04/11/2014 4:17 PM

## 2014-04-19 ENCOUNTER — Other Ambulatory Visit: Payer: Self-pay | Admitting: Orthopedic Surgery

## 2014-04-19 MED ORDER — CEFAZOLIN SODIUM-DEXTROSE 2-3 GM-% IV SOLR
2.0000 g | INTRAVENOUS | Status: AC
  Administered 2014-04-20: 2 g via INTRAVENOUS
  Filled 2014-04-19: qty 50

## 2014-04-19 MED ORDER — ACETAMINOPHEN 10 MG/ML IV SOLN
1000.0000 mg | Freq: Once | INTRAVENOUS | Status: AC
  Administered 2014-04-20: 1000 mg via INTRAVENOUS
  Filled 2014-04-19: qty 100

## 2014-04-19 MED ORDER — DEXAMETHASONE SODIUM PHOSPHATE 10 MG/ML IJ SOLN
10.0000 mg | Freq: Once | INTRAMUSCULAR | Status: AC
  Administered 2014-04-20: 10 mg via INTRAVENOUS
  Filled 2014-04-19: qty 1

## 2014-04-19 MED ORDER — BUPIVACAINE LIPOSOME 1.3 % IJ SUSP
20.0000 mL | INTRAMUSCULAR | Status: DC
  Filled 2014-04-19: qty 20

## 2014-04-19 NOTE — H&P (Signed)
Manuel [name] DOB: 1935-02-11 Married / Language: English / Race: White Male  Date of Admission:  04-20-2014  Chief complaint:  Right Hip Pain  History of Present Illness The patient is a 79 year old male who comes in for a preoperative History and Physical. The patient is scheduled for a right total hip arthroplasty (anterior approach) to be performed by Dr. Dione [name]. Aluisio, MD at [name] on 04-20-2014. The patient is a 79 year old male who presents for follow up of their hip. The patient is being followed for their right hip pain and osteoarthritis. Symptoms reported today include: pain. The patient feels that they are doing poorly and report their pain level to be moderate to severe. The following medication has been used for pain control: none (does have some hydrocodone if severe, but does not like taking due to nausea). Unfortunately the hip has become progressively worse. I saw him last June, noted he had arthritis, but he was tolerating it fairly well. He is no longer tolerating it well. He is hurting at all times. It is limiting what he can and cannot do. He is ready to proceed with hip surgery at this time.   Allergies No Known Drug Allergies  Problem List/Past Medical Osteoarthritis, Hip (715.35) Diabetes Mellitus, Type II High blood pressure Skin Cancer Kidney Stone Prostate Cancer Prostate Disease   Family History Severe allergy. mother Diabetes Mellitus. Mother. mother    Social History Tobacco use. Former smoker. former smoker; smoke(d) 1 pack(s) per day Drug/Alcohol Rehab (Currently). no Drug/Alcohol Rehab (Previously). no Exercise. Exercises never; does other Alcohol use. never consumed alcohol Children. 1 Current work status. retired Number of flights of stairs before winded. 1 Pain Contract. no Tobacco / smoke exposure. yes Illicit drug use. no Living situation. live with spouse Marital status.  married    Medication History Gabapentin (600MG  Tablet, Oral) Active. AmLODIPine Besylate (5MG  Tablet, Oral) Active. Enalapril Maleate (10MG  Tablet, Oral) Active. Fenofibrate (160MG  Tablet, Oral) Active. Rivastigmine Tartrate (3MG  Capsule, Oral) Active. Glimepiride (2MG  Tablet, Oral) Active. Pioglitazone HCl (30MG  Tablet, Oral) Active. Aspirin EC Low Strength (81MG  Tablet DR, Oral) Active.    Past Surgical History Vasectomy Prostatectomy; Transurethral Spinal Surgery. Date: 12/2012. Fusion L3-4 Cholecystectomy Lumbar Surgery Cataract Extraction-Bilateral   Review of Systems General:Not Present- Chills, Fever, Night Sweats, Fatigue, Weight Gain, Weight Loss and Memory Loss. Skin:Not Present- Hives, Itching, Rash, Eczema and Lesions. HEENT:Not Present- Tinnitus, Headache, Double Vision, Visual Loss, Hearing Loss and Dentures. Respiratory:Not Present- Shortness of breath with exertion, Shortness of breath at rest, Allergies, Coughing up blood and Chronic Cough. Cardiovascular:Not Present- Chest Pain, Racing/skipping heartbeats, Difficulty Breathing Lying Down, Murmur, Swelling and Palpitations. Gastrointestinal:Not Present- Bloody Stool, Heartburn, Abdominal Pain, Vomiting, Nausea, Constipation, Diarrhea, Difficulty Swallowing, Jaundice and Loss of appetitie. Male Genitourinary:Not Present- Urinary frequency, Blood in Urine, Weak urinary stream, Discharge, Flank Pain, Incontinence, Painful Urination, Urgency, Urinary Retention and Urinating at Night. Musculoskeletal:Not Present- Muscle Weakness, Muscle Pain, Joint Swelling, Joint Pain, Back Pain, Morning Stiffness and Spasms. Neurological:Not Present- Tremor, Dizziness, Blackout spells, Paralysis, Difficulty with balance and Weakness. Psychiatric:Not Present- Insomnia.    Vitals Pulse: 56 (Regular) Resp.: 14 (Unlabored) BP: 124/66 (Sitting, Left Arm, Standard)     Physical Exam The physical exam  findings are as follows:  Note: Patient is a 79 year old male with continued hip pain. Patient is accompanied today by his son Manuel [name].   General Mental Status - Alert, cooperative and good historian. General Appearance- pleasant. Not in  acute distress. Orientation- Oriented X3. Build & Nutrition- Well nourished and Well developed.   Head and Neck Head- normocephalic, atraumatic . Neck Global Assessment- supple. no bruit auscultated on the right and no bruit auscultated on the left.   Eye Vision- Wears corrective lenses. Pupil- Bilateral- Regular and Round. Motion- Bilateral- EOMI.   Chest and Lung Exam Auscultation: Breath sounds:- clear at anterior chest wall and - clear at posterior chest wall. Adventitious sounds:- No Adventitious sounds.   Cardiovascular Auscultation:Rhythm- Regular rate and rhythm. Heart Sounds- S1 WNL and S2 WNL. Murmurs & Other Heart Sounds: Murmur 1:Location- Aortic Area, Pulmonic Area and Sternal Border - Left. Timing- Holosystolic. Grade- II/VI. Character- Crescendo/Decrescendo.   Abdomen Palpation/Percussion:Tenderness- Abdomen is non-tender to palpation. Rigidity (guarding)- Abdomen is soft. Auscultation:Auscultation of the abdomen reveals - Bowel sounds normal.   Male Genitourinary   Note: Not done, not pertinent to present illness  Musculoskeletal   Note: Well developed male in no distress. His left hip has normal motion. No discomfort.  Right hip has flexion to about 90 degrees. No internal rotation, about 20 degrees. External rotation 20 degrees. He is walking with an antalgic gait pattern.  RADIOGRAPHS AP of pelvis and lateral of the right hip show bone on bone arthritis throughout with some subchondral cystic formation. This has progressed since his last set of films.   Assessment & Plan Osteoarthritis, Hip (715.35) Impression: Right Hip  Note: Plan is for a Right Total Hip Replacement -  Anterior Approach by Dr. Wynelle [name].  Plan is to go home.  PCP - Dr. Asencion [name] - Patient has been seen preoperatively and felt to be stable for surgery.  The patient does not have any contraindications and will receive TXA (tranexamic acid) prior to surgery.  Signed electronically by Manuel [name], III PA-C

## 2014-04-20 ENCOUNTER — Encounter (HOSPITAL_COMMUNITY)
Admission: RE | Disposition: A | Discharge status: Home / Self Care | Payer: Self-pay | Source: Ambulatory Visit | Attending: Orthopedic Surgery

## 2014-04-20 ENCOUNTER — Inpatient Hospital Stay (HOSPITAL_COMMUNITY)
Admission: RE | Admit: 2014-04-20 | Discharge: 2014-04-22 | DRG: 470 | Disposition: A | Discharge status: Home / Self Care | Payer: Medicare Other | Source: Ambulatory Visit | Attending: Orthopedic Surgery | Admitting: Orthopedic Surgery

## 2014-04-20 ENCOUNTER — Inpatient Hospital Stay (HOSPITAL_COMMUNITY): Payer: Medicare Other

## 2014-04-20 ENCOUNTER — Inpatient Hospital Stay (HOSPITAL_COMMUNITY): Payer: Medicare Other | Admitting: Anesthesiology

## 2014-04-20 ENCOUNTER — Encounter (HOSPITAL_COMMUNITY): Payer: Self-pay | Admitting: *Deleted

## 2014-04-20 ENCOUNTER — Encounter (HOSPITAL_COMMUNITY): Payer: Medicare Other | Admitting: Vascular Surgery

## 2014-04-20 DIAGNOSIS — Z85828 Personal history of other malignant neoplasm of skin: Secondary | ICD-10-CM

## 2014-04-20 DIAGNOSIS — Z8546 Personal history of malignant neoplasm of prostate: Secondary | ICD-10-CM

## 2014-04-20 DIAGNOSIS — M1610 Unilateral primary osteoarthritis, unspecified hip: Principal | ICD-10-CM | POA: Diagnosis present

## 2014-04-20 DIAGNOSIS — M169 Osteoarthritis of hip, unspecified: Secondary | ICD-10-CM | POA: Diagnosis present

## 2014-04-20 DIAGNOSIS — I10 Essential (primary) hypertension: Secondary | ICD-10-CM | POA: Diagnosis present

## 2014-04-20 DIAGNOSIS — N35919 Unspecified urethral stricture, male, unspecified site: Secondary | ICD-10-CM | POA: Diagnosis not present

## 2014-04-20 DIAGNOSIS — Z87891 Personal history of nicotine dependence: Secondary | ICD-10-CM

## 2014-04-20 DIAGNOSIS — N420 Calculus of prostate: Secondary | ICD-10-CM | POA: Diagnosis not present

## 2014-04-20 DIAGNOSIS — E119 Type 2 diabetes mellitus without complications: Secondary | ICD-10-CM | POA: Diagnosis present

## 2014-04-20 DIAGNOSIS — N320 Bladder-neck obstruction: Secondary | ICD-10-CM | POA: Diagnosis present

## 2014-04-20 DIAGNOSIS — Z833 Family history of diabetes mellitus: Secondary | ICD-10-CM

## 2014-04-20 HISTORY — PX: TOTAL HIP ARTHROPLASTY: SHX124

## 2014-04-20 HISTORY — PX: CYSTOSCOPY WITH URETHRAL DILATATION: SHX5125

## 2014-04-20 LAB — GLUCOSE, CAPILLARY
GLUCOSE-CAPILLARY: 137 mg/dL — AB (ref 70–99)
GLUCOSE-CAPILLARY: 213 mg/dL — AB (ref 70–99)
GLUCOSE-CAPILLARY: 224 mg/dL — AB (ref 70–99)
Glucose-Capillary: 329 mg/dL — ABNORMAL HIGH (ref 70–99)

## 2014-04-20 SURGERY — ARTHROPLASTY, HIP, TOTAL, ANTERIOR APPROACH
Anesthesia: General | Site: Urethra | Laterality: Right

## 2014-04-20 MED ORDER — CEFAZOLIN SODIUM-DEXTROSE 2-3 GM-% IV SOLR
2.0000 g | Freq: Four times a day (QID) | INTRAVENOUS | Status: AC
  Administered 2014-04-20 – 2014-04-21 (×2): 2 g via INTRAVENOUS
  Filled 2014-04-20 (×2): qty 50

## 2014-04-20 MED ORDER — 0.9 % SODIUM CHLORIDE (POUR BTL) OPTIME
TOPICAL | Status: DC | PRN
  Administered 2014-04-20: 1000 mL

## 2014-04-20 MED ORDER — ONDANSETRON HCL 4 MG/2ML IJ SOLN
4.0000 mg | Freq: Four times a day (QID) | INTRAMUSCULAR | Status: DC | PRN
Start: 2014-04-20 — End: 2014-04-22

## 2014-04-20 MED ORDER — PROMETHAZINE HCL 25 MG PO TABS
25.0000 mg | ORAL_TABLET | Freq: Four times a day (QID) | ORAL | Status: DC | PRN
Start: 2014-04-20 — End: 2014-04-22

## 2014-04-20 MED ORDER — ONDANSETRON HCL 4 MG/2ML IJ SOLN
INTRAMUSCULAR | Status: AC
  Filled 2014-04-20: qty 2

## 2014-04-20 MED ORDER — HYDROMORPHONE HCL PF 1 MG/ML IJ SOLN
INTRAMUSCULAR | Status: AC
  Filled 2014-04-20: qty 1

## 2014-04-20 MED ORDER — VECURONIUM BROMIDE 10 MG IV SOLR
INTRAVENOUS | Status: AC
  Filled 2014-04-20: qty 10

## 2014-04-20 MED ORDER — FENTANYL CITRATE 0.05 MG/ML IJ SOLN
INTRAMUSCULAR | Status: AC
  Filled 2014-04-20: qty 5

## 2014-04-20 MED ORDER — ROCURONIUM BROMIDE 50 MG/5ML IV SOLN
INTRAVENOUS | Status: AC
  Filled 2014-04-20: qty 1

## 2014-04-20 MED ORDER — METHOCARBAMOL 100 MG/ML IJ SOLN
500.0000 mg | Freq: Four times a day (QID) | INTRAVENOUS | Status: DC | PRN
  Administered 2014-04-20: 500 mg via INTRAVENOUS
  Filled 2014-04-20 (×2): qty 5

## 2014-04-20 MED ORDER — PHENOL 1.4 % MT LIQD: 1.0000 | OROMUCOSAL | Status: DC | PRN

## 2014-04-20 MED ORDER — PROMETHAZINE HCL 25 MG/ML IJ SOLN
INTRAMUSCULAR | Status: AC
  Filled 2014-04-20: qty 1

## 2014-04-20 MED ORDER — DEXAMETHASONE SODIUM PHOSPHATE 10 MG/ML IJ SOLN
10.0000 mg | Freq: Every day | INTRAMUSCULAR | Status: AC
Start: 2014-04-21 — End: 2014-04-21
  Filled 2014-04-20: qty 1

## 2014-04-20 MED ORDER — INSULIN ASPART 100 UNIT/ML ~~LOC~~ SOLN
0.0000 [IU] | Freq: Three times a day (TID) | SUBCUTANEOUS | Status: DC
  Administered 2014-04-20 – 2014-04-21 (×2): 5 [IU] via SUBCUTANEOUS
  Administered 2014-04-21: 8 [IU] via SUBCUTANEOUS
  Administered 2014-04-21: 5 [IU] via SUBCUTANEOUS
  Administered 2014-04-22: 2 [IU] via SUBCUTANEOUS

## 2014-04-20 MED ORDER — BUPIVACAINE HCL (PF) 0.25 % IJ SOLN
INTRAMUSCULAR | Status: DC | PRN
  Administered 2014-04-20: 20 mL

## 2014-04-20 MED ORDER — ROCURONIUM BROMIDE 100 MG/10ML IV SOLN
INTRAVENOUS | Status: DC | PRN
  Administered 2014-04-20: 50 mg via INTRAVENOUS

## 2014-04-20 MED ORDER — STERILE WATER FOR INJECTION IJ SOLN
INTRAMUSCULAR | Status: AC
  Filled 2014-04-20: qty 10

## 2014-04-20 MED ORDER — ENOXAPARIN SODIUM 40 MG/0.4ML ~~LOC~~ SOLN
40.0000 mg | SUBCUTANEOUS | Status: DC
  Administered 2014-04-21 – 2014-04-22 (×2): 40 mg via SUBCUTANEOUS
  Filled 2014-04-20 (×3): qty 0.4

## 2014-04-20 MED ORDER — LIDOCAINE HCL (CARDIAC) 20 MG/ML IV SOLN
INTRAVENOUS | Status: AC
  Filled 2014-04-20: qty 10

## 2014-04-20 MED ORDER — PROMETHAZINE HCL 25 MG/ML IJ SOLN
6.2500 mg | INTRAMUSCULAR | Status: DC | PRN
  Administered 2014-04-20: 6.25 mg via INTRAVENOUS

## 2014-04-20 MED ORDER — GLYCOPYRROLATE 0.2 MG/ML IJ SOLN
INTRAMUSCULAR | Status: AC
  Filled 2014-04-20: qty 4

## 2014-04-20 MED ORDER — LACTATED RINGERS IV SOLN
INTRAVENOUS | Status: DC
  Administered 2014-04-20: 08:00:00 via INTRAVENOUS

## 2014-04-20 MED ORDER — MEPERIDINE HCL 25 MG/ML IJ SOLN: 6.2500 mg | INTRAMUSCULAR | Status: DC | PRN

## 2014-04-20 MED ORDER — NEOSTIGMINE METHYLSULFATE 1 MG/ML IJ SOLN
INTRAMUSCULAR | Status: DC | PRN
  Administered 2014-04-20: 4 mg via INTRAVENOUS

## 2014-04-20 MED ORDER — FENTANYL CITRATE 0.05 MG/ML IJ SOLN
INTRAMUSCULAR | Status: AC
  Filled 2014-04-20: qty 2

## 2014-04-20 MED ORDER — ACETAMINOPHEN 500 MG PO TABS
1000.0000 mg | ORAL_TABLET | Freq: Four times a day (QID) | ORAL | Status: AC
  Administered 2014-04-20 – 2014-04-21 (×4): 1000 mg via ORAL
  Filled 2014-04-20 (×4): qty 2

## 2014-04-20 MED ORDER — SODIUM CHLORIDE 0.9 % IV SOLN: INTRAVENOUS | Status: DC

## 2014-04-20 MED ORDER — GLYCOPYRROLATE 0.2 MG/ML IJ SOLN
INTRAMUSCULAR | Status: DC | PRN
  Administered 2014-04-20: .8 mg via INTRAVENOUS

## 2014-04-20 MED ORDER — FENTANYL CITRATE 0.05 MG/ML IJ SOLN
25.0000 ug | INTRAMUSCULAR | Status: DC | PRN
  Administered 2014-04-20: 50 ug via INTRAVENOUS
  Administered 2014-04-20 (×2): 25 ug via INTRAVENOUS
  Administered 2014-04-20: 50 ug via INTRAVENOUS

## 2014-04-20 MED ORDER — ACETAMINOPHEN 325 MG PO TABS: 650.0000 mg | ORAL_TABLET | Freq: Four times a day (QID) | ORAL | Status: DC | PRN

## 2014-04-20 MED ORDER — ONDANSETRON HCL 4 MG/2ML IJ SOLN
INTRAMUSCULAR | Status: DC | PRN
  Administered 2014-04-20: 4 mg via INTRAVENOUS

## 2014-04-20 MED ORDER — METOCLOPRAMIDE HCL 5 MG/ML IJ SOLN: 5.0000 mg | Freq: Three times a day (TID) | INTRAMUSCULAR | Status: DC | PRN

## 2014-04-20 MED ORDER — OXYCODONE HCL 5 MG PO TABS
ORAL_TABLET | ORAL | Status: AC
  Filled 2014-04-20: qty 2

## 2014-04-20 MED ORDER — GLIMEPIRIDE 2 MG PO TABS
2.0000 mg | ORAL_TABLET | Freq: Every day | ORAL | Status: DC
  Administered 2014-04-21 – 2014-04-22 (×2): 2 mg via ORAL
  Filled 2014-04-20 (×3): qty 1

## 2014-04-20 MED ORDER — BUPIVACAINE HCL (PF) 0.25 % IJ SOLN
INTRAMUSCULAR | Status: AC
  Filled 2014-04-20: qty 30

## 2014-04-20 MED ORDER — SODIUM CHLORIDE 0.9 % IV SOLN
INTRAVENOUS | Status: DC
  Administered 2014-04-20: 22:00:00 via INTRAVENOUS

## 2014-04-20 MED ORDER — MORPHINE SULFATE 2 MG/ML IJ SOLN: 1.0000 mg | INTRAMUSCULAR | Status: DC | PRN

## 2014-04-20 MED ORDER — METHOCARBAMOL 500 MG PO TABS
500.0000 mg | ORAL_TABLET | Freq: Four times a day (QID) | ORAL | Status: DC | PRN
  Administered 2014-04-20 – 2014-04-22 (×4): 500 mg via ORAL
  Filled 2014-04-20 (×6): qty 1

## 2014-04-20 MED ORDER — TRAMADOL HCL 50 MG PO TABS: 50.0000 mg | ORAL_TABLET | Freq: Four times a day (QID) | ORAL | Status: DC | PRN

## 2014-04-20 MED ORDER — FLEET ENEMA 7-19 GM/118ML RE ENEM: 1.0000 | ENEMA | Freq: Once | RECTAL | Status: AC | PRN

## 2014-04-20 MED ORDER — EPHEDRINE SULFATE 50 MG/ML IJ SOLN
INTRAMUSCULAR | Status: DC | PRN
  Administered 2014-04-20: 15 mg via INTRAVENOUS
  Administered 2014-04-20 (×2): 10 mg via INTRAVENOUS

## 2014-04-20 MED ORDER — OXYCODONE HCL 5 MG PO TABS
5.0000 mg | ORAL_TABLET | ORAL | Status: DC | PRN
  Administered 2014-04-20 – 2014-04-22 (×6): 10 mg via ORAL
  Filled 2014-04-20 (×5): qty 2

## 2014-04-20 MED ORDER — CHLORHEXIDINE GLUCONATE 4 % EX LIQD
60.0000 mL | Freq: Once | CUTANEOUS | Status: DC
  Filled 2014-04-20: qty 60

## 2014-04-20 MED ORDER — LIDOCAINE HCL (CARDIAC) 20 MG/ML IV SOLN
INTRAVENOUS | Status: DC | PRN
  Administered 2014-04-20: 50 mg via INTRAVENOUS

## 2014-04-20 MED ORDER — LACTATED RINGERS IV SOLN
INTRAVENOUS | Status: DC | PRN
  Administered 2014-04-20 (×2): via INTRAVENOUS

## 2014-04-20 MED ORDER — ACETAMINOPHEN 650 MG RE SUPP: 650.0000 mg | Freq: Four times a day (QID) | RECTAL | Status: DC | PRN

## 2014-04-20 MED ORDER — FENOFIBRATE 160 MG PO TABS
160.0000 mg | ORAL_TABLET | Freq: Every day | ORAL | Status: DC
Start: 2014-04-20 — End: 2014-04-22
  Administered 2014-04-20 – 2014-04-22 (×3): 160 mg via ORAL
  Filled 2014-04-20 (×3): qty 1

## 2014-04-20 MED ORDER — HYDROMORPHONE HCL PF 1 MG/ML IJ SOLN
0.2500 mg | INTRAMUSCULAR | Status: DC | PRN
  Administered 2014-04-20 (×2): 0.25 mg via INTRAVENOUS
  Administered 2014-04-20: 0.5 mg via INTRAVENOUS

## 2014-04-20 MED ORDER — DEXAMETHASONE 6 MG PO TABS
10.0000 mg | ORAL_TABLET | Freq: Every day | ORAL | Status: AC
  Administered 2014-04-21: 10 mg via ORAL
  Filled 2014-04-20: qty 1

## 2014-04-20 MED ORDER — FENTANYL CITRATE 0.05 MG/ML IJ SOLN
INTRAMUSCULAR | Status: AC
  Administered 2014-04-20: 50 ug via INTRAVENOUS
  Filled 2014-04-20: qty 2

## 2014-04-20 MED ORDER — BUPIVACAINE LIPOSOME 1.3 % IJ SUSP
INTRAMUSCULAR | Status: DC | PRN
Start: 2014-04-20 — End: 2014-04-20
  Administered 2014-04-20: 20 mL

## 2014-04-20 MED ORDER — METOCLOPRAMIDE HCL 10 MG PO TABS: 5.0000 mg | ORAL_TABLET | Freq: Three times a day (TID) | ORAL | Status: DC | PRN

## 2014-04-20 MED ORDER — BISACODYL 10 MG RE SUPP: 10.0000 mg | Freq: Every day | RECTAL | Status: DC | PRN

## 2014-04-20 MED ORDER — PROPOFOL 10 MG/ML IV BOLUS
INTRAVENOUS | Status: DC | PRN
  Administered 2014-04-20: 200 mg via INTRAVENOUS

## 2014-04-20 MED ORDER — ARTIFICIAL TEARS OP OINT
TOPICAL_OINTMENT | OPHTHALMIC | Status: DC | PRN
  Administered 2014-04-20: 1 via OPHTHALMIC

## 2014-04-20 MED ORDER — POLYETHYLENE GLYCOL 3350 17 G PO PACK: 17.0000 g | PACK | Freq: Every day | ORAL | Status: DC | PRN

## 2014-04-20 MED ORDER — TRANEXAMIC ACID 100 MG/ML IV SOLN
1000.0000 mg | INTRAVENOUS | Status: AC
  Administered 2014-04-20: 1000 mg via INTRAVENOUS
  Filled 2014-04-20: qty 10

## 2014-04-20 MED ORDER — DIPHENHYDRAMINE HCL 12.5 MG/5ML PO ELIX: 12.5000 mg | ORAL_SOLUTION | ORAL | Status: DC | PRN

## 2014-04-20 MED ORDER — PROPOFOL 10 MG/ML IV BOLUS
INTRAVENOUS | Status: AC
  Filled 2014-04-20: qty 20

## 2014-04-20 MED ORDER — SODIUM CHLORIDE 0.9 % IV SOLN
10.0000 mg | INTRAVENOUS | Status: DC | PRN
  Administered 2014-04-20: 50 ug/min via INTRAVENOUS

## 2014-04-20 MED ORDER — NEOSTIGMINE METHYLSULFATE 1 MG/ML IJ SOLN
INTRAMUSCULAR | Status: AC
  Filled 2014-04-20: qty 10

## 2014-04-20 MED ORDER — VECURONIUM BROMIDE 10 MG IV SOLR
INTRAVENOUS | Status: DC | PRN
  Administered 2014-04-20: 3 mg via INTRAVENOUS

## 2014-04-20 MED ORDER — AMLODIPINE BESYLATE 5 MG PO TABS
5.0000 mg | ORAL_TABLET | Freq: Every day | ORAL | Status: DC
Start: 2014-04-20 — End: 2014-04-22
  Administered 2014-04-20 – 2014-04-21 (×2): 5 mg via ORAL
  Filled 2014-04-20 (×3): qty 1

## 2014-04-20 MED ORDER — RIVASTIGMINE TARTRATE 3 MG PO CAPS
3.0000 mg | ORAL_CAPSULE | Freq: Two times a day (BID) | ORAL | Status: DC
  Administered 2014-04-20 – 2014-04-22 (×4): 3 mg via ORAL
  Filled 2014-04-20 (×6): qty 1

## 2014-04-20 MED ORDER — DOCUSATE SODIUM 100 MG PO CAPS
100.0000 mg | ORAL_CAPSULE | Freq: Two times a day (BID) | ORAL | Status: DC
  Administered 2014-04-20 – 2014-04-21 (×3): 100 mg via ORAL
  Filled 2014-04-20 (×4): qty 1

## 2014-04-20 MED ORDER — GABAPENTIN 600 MG PO TABS
600.0000 mg | ORAL_TABLET | Freq: Every day | ORAL | Status: DC
Start: 2014-04-20 — End: 2014-04-22
  Administered 2014-04-20 – 2014-04-22 (×10): 600 mg via ORAL
  Filled 2014-04-20 (×17): qty 1

## 2014-04-20 MED ORDER — PHENYLEPHRINE HCL 10 MG/ML IJ SOLN
INTRAMUSCULAR | Status: DC | PRN
  Administered 2014-04-20 (×2): 80 ug via INTRAVENOUS

## 2014-04-20 MED ORDER — MENTHOL 3 MG MT LOZG: 1.0000 | LOZENGE | OROMUCOSAL | Status: DC | PRN

## 2014-04-20 MED ORDER — GLYCOPYRROLATE 0.2 MG/ML IJ SOLN
INTRAMUSCULAR | Status: AC
  Filled 2014-04-20: qty 1

## 2014-04-20 MED ORDER — SODIUM CHLORIDE 0.9 % IJ SOLN
INTRAMUSCULAR | Status: DC | PRN
  Administered 2014-04-20: 30 mL

## 2014-04-20 MED ORDER — FENTANYL CITRATE 0.05 MG/ML IJ SOLN
INTRAMUSCULAR | Status: DC | PRN
  Administered 2014-04-20 (×6): 50 ug via INTRAVENOUS

## 2014-04-20 MED ORDER — PHENYLEPHRINE HCL 10 MG/ML IJ SOLN
INTRAMUSCULAR | Status: AC
  Filled 2014-04-20: qty 1

## 2014-04-20 MED ORDER — ONDANSETRON HCL 4 MG PO TABS: 4.0000 mg | ORAL_TABLET | Freq: Four times a day (QID) | ORAL | Status: DC | PRN

## 2014-04-20 SURGICAL SUPPLY — 48 items
BLADE SAW SGTL 18X1.27X75 (BLADE) ×3 IMPLANT
CAPT HIP PF MOP ×1 IMPLANT
CATH COUDE FOLEY 2W 5CC 16FR (CATHETERS) ×1 IMPLANT
CATH COUDE FOLEY 2W 5CC 18FR (CATHETERS) ×1 IMPLANT
CLSR STERI-STRIP ANTIMIC 1/2X4 (GAUZE/BANDAGES/DRESSINGS) ×5 IMPLANT
DECANTER SPIKE VIAL GLASS SM (MISCELLANEOUS) ×3 IMPLANT
DRAPE C-ARM 42X72 X-RAY (DRAPES) ×3 IMPLANT
DRAPE STERI IOBAN 125X83 (DRAPES) ×3 IMPLANT
DRAPE U-SHAPE 47X51 STRL (DRAPES) ×9 IMPLANT
DRSG ADAPTIC 3X8 NADH LF (GAUZE/BANDAGES/DRESSINGS) ×3 IMPLANT
DRSG MEPILEX BORDER 4X4 (GAUZE/BANDAGES/DRESSINGS) ×1 IMPLANT
DRSG MEPILEX BORDER 4X8 (GAUZE/BANDAGES/DRESSINGS) ×3 IMPLANT
DURAPREP 26ML APPLICATOR (WOUND CARE) ×3 IMPLANT
ELECT BLADE 6.5 EXT (BLADE) ×3 IMPLANT
ELECT REM PT RETURN 9FT ADLT (ELECTROSURGICAL) ×3
ELECTRODE REM PT RTRN 9FT ADLT (ELECTROSURGICAL) ×2 IMPLANT
EVACUATOR 1/8 PVC DRAIN (DRAIN) ×3 IMPLANT
FACESHIELD WRAPAROUND (MASK) ×9 IMPLANT
FACESHIELD WRAPAROUND OR TEAM (MASK) ×8 IMPLANT
GLOVE BIO SURGEON STRL SZ8 (GLOVE) ×13 IMPLANT
GLOVE BIOGEL PI IND STRL 8 (GLOVE) ×2 IMPLANT
GLOVE BIOGEL PI INDICATOR 8 (GLOVE) ×1
GLOVE ECLIPSE 8.0 STRL XLNG CF (GLOVE) ×3 IMPLANT
GOWN STRL REUS W/ TWL LRG LVL3 (GOWN DISPOSABLE) ×2 IMPLANT
GOWN STRL REUS W/ TWL XL LVL3 (GOWN DISPOSABLE) ×2 IMPLANT
GOWN STRL REUS W/TWL LRG LVL3 (GOWN DISPOSABLE) ×12
GOWN STRL REUS W/TWL XL LVL3 (GOWN DISPOSABLE) ×3
GUIDEWIRE STR DUAL SENSOR (WIRE) ×1 IMPLANT
KIT BASIN OR (CUSTOM PROCEDURE TRAY) ×3 IMPLANT
NDL 18GX1X1/2 (RX/OR ONLY) (NEEDLE) ×2 IMPLANT
NDL SAFETY ECLIPSE 18X1.5 (NEEDLE) ×2 IMPLANT
NEEDLE 18GX1X1/2 (RX/OR ONLY) (NEEDLE) ×3 IMPLANT
NEEDLE 22X1 1/2 (OR ONLY) (NEEDLE) ×3 IMPLANT
NEEDLE HYPO 18GX1.5 SHARP (NEEDLE) ×3
PACK TOTAL JOINT (CUSTOM PROCEDURE TRAY) ×3 IMPLANT
SUT ETHIBOND NAB CT1 #1 30IN (SUTURE) ×9 IMPLANT
SUT MNCRL AB 4-0 PS2 18 (SUTURE) ×3 IMPLANT
SUT VIC AB 1 CT1 27 (SUTURE) ×3
SUT VIC AB 1 CT1 27XBRD ANTBC (SUTURE) ×2 IMPLANT
SUT VIC AB 2-0 CT1 27 (SUTURE) ×6
SUT VIC AB 2-0 CT1 TAPERPNT 27 (SUTURE) ×4 IMPLANT
SUT VLOC 180 0 24IN GS25 (SUTURE) ×3 IMPLANT
SYR 20CC LL (SYRINGE) ×3 IMPLANT
SYR 50ML LL SCALE MARK (SYRINGE) ×4 IMPLANT
SYRINGE IRR TOOMEY STRL 70CC (SYRINGE) ×1 IMPLANT
TOWEL OR 17X26 10 PK STRL BLUE (TOWEL DISPOSABLE) ×6 IMPLANT
TRAY FOLEY CATH 14FRSI W/METER (CATHETERS) ×3 IMPLANT
TRAY FOLEY CATH 16FRSI W/METER (SET/KITS/TRAYS/PACK) ×2 IMPLANT

## 2014-04-20 NOTE — Anesthesia Postprocedure Evaluation (Signed)
Anesthesia Post Note  Patient: Manuel [name]  Procedure(s) Performed: Procedure(s) (LRB): RIGHT TOTAL HIP ARTHROPLASTY ANTERIOR APPROACH (Right) CYSTOSCOPY WITH URETHRAL DILATATION AND FOLEY PLACEMENT (N/A)  Anesthesia type: General  Patient location: PACU  Post pain: Pain level controlled and Adequate analgesia  Post assessment: Post-op Vital signs reviewed, Patient's Cardiovascular Status Stable, Respiratory Function Stable, Patent Airway and Pain level controlled  Last Vitals:  Filed Vitals:   04/20/14 1415  BP: 169/51  Pulse: 58  Temp:   Resp: 11    Post vital signs: Reviewed and stable  Level of consciousness: awake, alert  and oriented  Complications: No apparent anesthesia complications

## 2014-04-20 NOTE — Interval H&P Note (Signed)
History and Physical Interval Note:  04/20/2014 9:10 AM  Manuel [name]  has presented today for surgery, with the diagnosis of Osteoarthritis of the Right Hip  The various methods of treatment have been discussed with the patient and family. After consideration of risks, benefits and other options for treatment, the patient has consented to  Procedure(s): RIGHT TOTAL HIP ARTHROPLASTY ANTERIOR APPROACH (Right) as a surgical intervention .  The patient's history has been reviewed, patient examined, no change in status, stable for surgery.  I have reviewed the patient's chart and labs.  Questions were answered to the patient's satisfaction.     Manuel [name]

## 2014-04-20 NOTE — Anesthesia Procedure Notes (Signed)
Procedure Name: Intubation Date/Time: 04/20/2014 9:55 AM Performed by: Neldon Newport Pre-anesthesia Checklist: Patient identified, Timeout performed, Emergency Drugs available, Suction available and Patient being monitored Patient Re-evaluated:Patient Re-evaluated prior to inductionOxygen Delivery Method: Circle system utilized Preoxygenation: Pre-oxygenation with 100% oxygen Intubation Type: IV induction Ventilation: Mask ventilation without difficulty Laryngoscope Size: Mac and 4 Grade View: Grade II Tube type: Oral Tube size: 7.5 mm Number of attempts: 2 Placement Confirmation: positive ETCO2,  ETT inserted through vocal cords under direct vision and breath sounds checked- equal and bilateral Secured at: 23 cm Tube secured with: Tape Dental Injury: Teeth and Oropharynx as per pre-operative assessment

## 2014-04-20 NOTE — Consult Note (Signed)
Day of Surgery  Subjective: I was asked to see Manuel [name] in consultation by Dr. Maureen Ralphs for foley placement.   An attempt was made to place a foley in the OR prior to his right hip surgery but the nursing staff was unsuccessful.  Manuel [name] has a history of prostate cancer with a prior seed implant.   He has been seen by Dr. Michela Pitcher in the past with a TURP and holmium laser ablation of the prostate in 2012 for post seed obstruction.   I am not able to obtain additional history. ROS:  Review of Systems  Unable to perform ROS: intubated    No Known Allergies  Past Medical History  Diagnosis Date  . Hypertension   . Back pain   . Cancer     PROSTATE -- RADIATION SEED IMPLANT  . Diabetes mellitus     DX SINCE 1998    Past Surgical History  Procedure Laterality Date  . Back surgery    . Prostate surgery      with seed implants  . Cholecystectomy    . Vasectomy    . Eye surgery      CATARACT BOTH EYES 2014    History   Social History  . Marital Status: Married    Spouse Name: N/A    Number of Children: N/A  . Years of Education: college   Occupational History  . retired    Social History Main Topics  . Smoking status: Never Smoker   . Smokeless tobacco: Not on file  . Alcohol Use: No  . Drug Use: No  . Sexual Activity: No   Other Topics Concern  . Not on file   Social History Narrative  . No narrative on file    Family History  Problem Relation Age of Onset  . Asthma    . Diabetes      Anti-infectives: Anti-infectives   Start     Dose/Rate Route Frequency Ordered Stop   04/20/14 1800  ceFAZolin (ANCEF) IVPB 2 g/50 mL premix     2 g 100 mL/hr over 30 Minutes Intravenous Every 6 hours 04/20/14 1711 04/21/14 0559   04/20/14 0600  ceFAZolin (ANCEF) IVPB 2 g/50 mL premix     2 g 100 mL/hr over 30 Minutes Intravenous On call to O.R. 04/19/14 1409 04/20/14 0957      Current Facility-Administered Medications  Medication Dose Route Frequency Provider Last  Rate Last Dose  . 0.9 %  sodium chloride infusion   Intravenous Continuous Gearlean Alf, MD      . Derrill Memo ON 04/21/2014] acetaminophen (TYLENOL) tablet 650 mg  650 mg Oral Q6H PRN Gearlean Alf, MD       Or  . Derrill Memo ON 04/21/2014] acetaminophen (TYLENOL) suppository 650 mg  650 mg Rectal Q6H PRN Gearlean Alf, MD      . acetaminophen (TYLENOL) tablet 1,000 mg  1,000 mg Oral 4 times per day Gearlean Alf, MD   1,000 mg at 04/20/14 1855  . amLODipine (NORVASC) tablet 5 mg  5 mg Oral Daily Gearlean Alf, MD   5 mg at 04/20/14 1856  . bisacodyl (DULCOLAX) suppository 10 mg  10 mg Rectal Daily PRN Gearlean Alf, MD      . ceFAZolin (ANCEF) IVPB 2 g/50 mL premix  2 g Intravenous Q6H Gearlean Alf, MD   2 g at 04/20/14 1856  . [START ON 04/21/2014] dexamethasone (DECADRON) tablet 10 mg  10 mg Oral  Daily Gearlean Alf, MD       Or  . Derrill Memo ON 04/21/2014] dexamethasone (DECADRON) injection 10 mg  10 mg Intravenous Daily Gearlean Alf, MD      . diphenhydrAMINE (BENADRYL) 12.5 MG/5ML elixir 12.5-25 mg  12.5-25 mg Oral Q4H PRN Gearlean Alf, MD      . docusate sodium (COLACE) capsule 100 mg  100 mg Oral BID Gearlean Alf, MD      . Derrill Memo ON 04/21/2014] enoxaparin (LOVENOX) injection 40 mg  40 mg Subcutaneous Q24H Gearlean Alf, MD      . fenofibrate tablet 160 mg  160 mg Oral Daily Gearlean Alf, MD   160 mg at 04/20/14 1856  . fentaNYL (SUBLIMAZE) 0.05 MG/ML injection           . gabapentin (NEURONTIN) tablet 600 mg  600 mg Oral 6 X Daily Gearlean Alf, MD   600 mg at 04/20/14 2026  . [START ON 04/21/2014] glimepiride (AMARYL) tablet 2 mg  2 mg Oral QAC breakfast Gearlean Alf, MD      . HYDROmorphone (DILAUDID) 1 MG/ML injection           . [START ON 04/21/2014] insulin aspart (novoLOG) injection 0-15 Units  0-15 Units Subcutaneous TID WC Gearlean Alf, MD   5 Units at 04/20/14 1934  . menthol-cetylpyridinium (CEPACOL) lozenge 3 mg  1 lozenge Oral PRN Gearlean Alf, MD        Or  . phenol (CHLORASEPTIC) mouth spray 1 spray  1 spray Mouth/Throat PRN Gearlean Alf, MD      . methocarbamol (ROBAXIN) tablet 500 mg  500 mg Oral Q6H PRN Gearlean Alf, MD   500 mg at 04/20/14 2026   Or  . methocarbamol (ROBAXIN) 500 mg in dextrose 5 % 50 mL IVPB  500 mg Intravenous Q6H PRN Gearlean Alf, MD   500 mg at 04/20/14 1318  . metoCLOPramide (REGLAN) tablet 5-10 mg  5-10 mg Oral Q8H PRN Gearlean Alf, MD       Or  . metoCLOPramide (REGLAN) injection 5-10 mg  5-10 mg Intravenous Q8H PRN Gearlean Alf, MD      . morphine 2 MG/ML injection 1-2 mg  1-2 mg Intravenous Q1H PRN Gearlean Alf, MD      . ondansetron [facility]) tablet 4 mg  4 mg Oral Q6H PRN Gearlean Alf, MD       Or  . ondansetron (ZOFRAN) injection 4 mg  4 mg Intravenous Q6H PRN Gearlean Alf, MD      . oxyCODONE (Oxy IR/ROXICODONE) 5 MG immediate release tablet           . oxyCODONE (Oxy IR/ROXICODONE) immediate release tablet 5-10 mg  5-10 mg Oral Q3H PRN Gearlean Alf, MD   10 mg at 04/20/14 2025  . polyethylene glycol (MIRALAX / GLYCOLAX) packet 17 g  17 g Oral Daily PRN Gearlean Alf, MD      . promethazine (PHENERGAN) 25 MG/ML injection           . promethazine (PHENERGAN) tablet 25 mg  25 mg Oral Q6H PRN Gearlean Alf, MD      . rivastigmine (EXELON) capsule 3 mg  3 mg Oral BID Gearlean Alf, MD      . sodium phosphate (FLEET) 7-19 GM/118ML enema 1 enema  1 enema Rectal Once PRN Gearlean Alf, MD      .  traMADol (ULTRAM) tablet 50-100 mg  50-100 mg Oral Q6H PRN Gearlean Alf, MD         Objective: Vital signs in last 24 hours: Temp:  [97.4 F (36.3 C)-98.2 F (36.8 C)] 97.4 F (36.3 C) (04/24 1634) Pulse Rate:  [52-67] 65 (04/24 1634) Resp:  [8-18] 14 (04/24 1634) BP: (143-193)/(51-66) 174/52 mmHg (04/24 1856) SpO2:  [93 %-100 %] 100 % (04/24 1634) Weight:  [113.853 kg (251 lb)] 113.853 kg (251 lb) (04/24 0806)  Intake/Output from previous day:   Intake/Output this  shift:     Physical Exam  Constitutional:  WD, WN under general anesthesia  Abdominal: Soft. He exhibits no distension.  No inguinal hernias  Genitourinary: Penis normal.  With no blood at the normal meatus.  Scrotum and testes normal.   Lymphadenopathy:  No inguinal adenopathy    Lab Results:  No results found for this basename: WBC, HGB, HCT, PLT,  in the last 72 hours BMET No results found for this basename: NA, K, CL, CO2, GLUCOSE, BUN, CREATININE, CALCIUM,  in the last 72 hours PT/INR No results found for this basename: LABPROT, INR,  in the last 72 hours ABG No results found for this basename: PHART, PCO2, PO2, HCO3,  in the last 72 hours  Studies/Results: Dg Hip Operative Right  04/20/2014   CLINICAL DATA:  Right hip replacement.  EXAM: DG OPERATIVE RIGHT HIP  TECHNIQUE: A single spot fluoroscopic AP image of the right hip is submitted.  COMPARISON:  Single view of the pelvis 04/10/2014.  FINDINGS: We are provided with 2 fluoroscopic intraoperative spot views of the right hip. Images demonstrate severe right hip osteoarthritis. Radiotherapy seeds for prostate cancer are noted.  IMPRESSION: Right hip replacement progress.   Electronically Signed   By: Inge Rise M.D.   On: 04/20/2014 13:44   Dg Pelvis Portable  04/20/2014   CLINICAL DATA:  79 year old male status post right hip surgery. Initial encounter.  EXAM: PORTABLE PELVIS 1-2 VIEWS  COMPARISON:  Intraoperative images from 10/20 9 hr the same day.  FINDINGS: Portable AP view at 1258 hrs. New right bipolar hip arthroplasty in place. Hardware appears intact. Normal alignment on this single view.  Chronic postoperative/post brachy therapy changes to the region of the prostate and scrotum. Bladder catheter in place, small volume of gas within the bladder. Advanced degenerative changes also noted at the left hip.  IMPRESSION: Right hip arthroplasty with no adverse features.   Electronically Signed   By: Lars Pinks M.D.   On:  04/20/2014 13:08   Case discussed with Dr. Maureen Ralphs and Chart review performed  Cystoscopy with dilation and foley insertion performed.   See op note.   Assessment: s/p Procedure(s): RIGHT TOTAL HIP ARTHROPLASTY ANTERIOR APPROACH CYSTOSCOPY WITH URETHRAL DILATATION AND FOLEY PLACEMENT  Foley trauma with false passage and prostatic stones with with prostatic stricture. Plan: He will need the foley for about a week for the false passage to heal.   I will follow.   CC: Dr. Hector Shade and Dr. Wallene Huh.    LOS: 0 days    Irine Seal 04/20/2014

## 2014-04-20 NOTE — H&P (View-Only) (Signed)
Manuel [name] DOB: 1935-02-11 Married / Language: English / Race: White Male  Date of Admission:  04-20-2014  Chief complaint:  Right Hip Pain  History of Present Illness The patient is a 79 year old male who comes in for a preoperative History and Physical. The patient is scheduled for a right total hip arthroplasty (anterior approach) to be performed by Dr. Dione Plover. Aluisio, MD at [facility] on 04-20-2014. The patient is a 79 year old male who presents for follow up of their hip. The patient is being followed for their right hip pain and osteoarthritis. Symptoms reported today include: pain. The patient feels that they are doing poorly and report their pain level to be moderate to severe. The following medication has been used for pain control: none (does have some hydrocodone if severe, but does not like taking due to nausea). Unfortunately the hip has become progressively worse. I saw him last June, noted he had arthritis, but he was tolerating it fairly well. He is no longer tolerating it well. He is hurting at all times. It is limiting what he can and cannot do. He is ready to proceed with hip surgery at this time.   Allergies No Known Drug Allergies  Problem List/Past Medical Osteoarthritis, Hip (715.35) Diabetes Mellitus, Type II High blood pressure Skin Cancer Kidney Stone Prostate Cancer Prostate Disease   Family History Severe allergy. mother Diabetes Mellitus. Mother. mother    Social History Tobacco use. Former smoker. former smoker; smoke(d) 1 pack(s) per day Drug/Alcohol Rehab (Currently). no Drug/Alcohol Rehab (Previously). no Exercise. Exercises never; does other Alcohol use. never consumed alcohol Children. 1 Current work status. retired Number of flights of stairs before winded. 1 Pain Contract. no Tobacco / smoke exposure. yes Illicit drug use. no Living situation. live with spouse Marital status.  married    Medication History Gabapentin (600MG  Tablet, Oral) Active. AmLODIPine Besylate (5MG  Tablet, Oral) Active. Enalapril Maleate (10MG  Tablet, Oral) Active. Fenofibrate (160MG  Tablet, Oral) Active. Rivastigmine Tartrate (3MG  Capsule, Oral) Active. Glimepiride (2MG  Tablet, Oral) Active. Pioglitazone HCl (30MG  Tablet, Oral) Active. Aspirin EC Low Strength (81MG  Tablet DR, Oral) Active.    Past Surgical History Vasectomy Prostatectomy; Transurethral Spinal Surgery. Date: 12/2012. Fusion L3-4 Cholecystectomy Lumbar Surgery Cataract Extraction-Bilateral   Review of Systems General:Not Present- Chills, Fever, Night Sweats, Fatigue, Weight Gain, Weight Loss and Memory Loss. Skin:Not Present- Hives, Itching, Rash, Eczema and Lesions. HEENT:Not Present- Tinnitus, Headache, Double Vision, Visual Loss, Hearing Loss and Dentures. Respiratory:Not Present- Shortness of breath with exertion, Shortness of breath at rest, Allergies, Coughing up blood and Chronic Cough. Cardiovascular:Not Present- Chest Pain, Racing/skipping heartbeats, Difficulty Breathing Lying Down, Murmur, Swelling and Palpitations. Gastrointestinal:Not Present- Bloody Stool, Heartburn, Abdominal Pain, Vomiting, Nausea, Constipation, Diarrhea, Difficulty Swallowing, Jaundice and Loss of appetitie. Male Genitourinary:Not Present- Urinary frequency, Blood in Urine, Weak urinary stream, Discharge, Flank Pain, Incontinence, Painful Urination, Urgency, Urinary Retention and Urinating at Night. Musculoskeletal:Not Present- Muscle Weakness, Muscle Pain, Joint Swelling, Joint Pain, Back Pain, Morning Stiffness and Spasms. Neurological:Not Present- Tremor, Dizziness, Blackout spells, Paralysis, Difficulty with balance and Weakness. Psychiatric:Not Present- Insomnia.    Vitals Pulse: 56 (Regular) Resp.: 14 (Unlabored) BP: 124/66 (Sitting, Left Arm, Standard)     Physical Exam The physical exam  findings are as follows:  Note: Patient is a 79 year old male with continued hip pain. Patient is accompanied today by his son Manuel [name].   General Mental Status - Alert, cooperative and good historian. General Appearance- pleasant. Not in  acute distress. Orientation- Oriented X3. Build & Nutrition- Well nourished and Well developed.   Head and Neck Head- normocephalic, atraumatic . Neck Global Assessment- supple. no bruit auscultated on the right and no bruit auscultated on the left.   Eye Vision- Wears corrective lenses. Pupil- Bilateral- Regular and Round. Motion- Bilateral- EOMI.   Chest and Lung Exam Auscultation: Breath sounds:- clear at anterior chest wall and - clear at posterior chest wall. Adventitious sounds:- No Adventitious sounds.   Cardiovascular Auscultation:Rhythm- Regular rate and rhythm. Heart Sounds- S1 WNL and S2 WNL. Murmurs & Other Heart Sounds: Murmur 1:Location- Aortic Area, Pulmonic Area and Sternal Border - Left. Timing- Holosystolic. Grade- II/VI. Character- Crescendo/Decrescendo.   Abdomen Palpation/Percussion:Tenderness- Abdomen is non-tender to palpation. Rigidity (guarding)- Abdomen is soft. Auscultation:Auscultation of the abdomen reveals - Bowel sounds normal.   Male Genitourinary   Note: Not done, not pertinent to present illness  Musculoskeletal   Note: Well developed male in no distress. His left hip has normal motion. No discomfort.  Right hip has flexion to about 90 degrees. No internal rotation, about 20 degrees. External rotation 20 degrees. He is walking with an antalgic gait pattern.  RADIOGRAPHS AP of pelvis and lateral of the right hip show bone on bone arthritis throughout with some subchondral cystic formation. This has progressed since his last set of films.   Assessment & Plan Osteoarthritis, Hip (715.35) Impression: Right Hip  Note: Plan is for a Right Total Hip Replacement -  Anterior Approach by Dr. Wynelle Link.  Plan is to go home.  PCP - Dr. Asencion Noble - Patient has been seen preoperatively and felt to be stable for surgery.  The patient does not have any contraindications and will receive TXA (tranexamic acid) prior to surgery.  Signed electronically by Joelene Millin, III PA-C

## 2014-04-20 NOTE — Op Note (Signed)
OPERATIVE REPORT  PREOPERATIVE DIAGNOSIS: Osteoarthritis of the Right hip.   POSTOPERATIVE DIAGNOSIS: Osteoarthritis of the Right  hip.   PROCEDURE: Right total hip arthroplasty, anterior approach.   SURGEON: Gaynelle Arabian, MD   ASSISTANT: Arlee Muslim, PA-C  ANESTHESIA:  General  ESTIMATED BLOOD LOSS:- 200 ml  DRAINS: Hemovac x1.   COMPLICATIONS: None   CONDITION: PACU - hemodynamically stable.   BRIEF CLINICAL NOTE: Manuel [name] is a 79 y.o. male who has advanced end-  stage arthritis of his Right  hip with progressively worsening pain and  dysfunction.The patient has failed nonoperative management and presents for  total hip arthroplasty.   PROCEDURE IN DETAIL: After successful administration of spinal  anesthetic, the traction boots for the [facility] bed were placed on both  feet and the patient was placed onto the [facility] bed, boots placed into the leg  holders. The Right hip was then isolated from the perineum with plastic  drapes and prepped and draped in the usual sterile fashion. ASIS and  greater trochanter were marked and a oblique incision was made, starting  at about 1 cm lateral and 2 cm distal to the ASIS and coursing towards  the anterior cortex of the femur. The skin was cut with a 10 blade  through subcutaneous tissue to the level of the fascia overlying the  tensor fascia lata muscle. The fascia was then incised in line with the  incision at the junction of the anterior third and posterior 2/3rd. The  muscle was teased off the fascia and then the interval between the TFL  and the rectus was developed. The Hohmann retractor was then placed at  the top of the femoral neck over the capsule. The vessels overlying the  capsule were cauterized and the fat on top of the capsule was removed.  A Hohmann retractor was then placed anterior underneath the rectus  femoris to give exposure to the entire anterior capsule. A T-shaped  capsulotomy was performed. The  edges were tagged and the femoral head  was identified.       Osteophytes are removed off the superior acetabulum.  The femoral neck was then cut in situ with an oscillating saw. Traction  was then applied to the left lower extremity utilizing the [facility]  traction. The femoral head was then removed. Retractors were placed  around the acetabulum and then circumferential removal of the labrum was  performed. Osteophytes were also removed. Reaming starts at 47 mm to  medialize and  Increased in 2 mm increments to 55 mm. We reamed in  approximately 40 degrees of abduction, 20 degrees anteversion. A 56 mm  pinnacle acetabular shell was then impacted in anatomic position under  fluoroscopic guidance with excellent purchase. We did not need to place  any additional dome screws. A 36 mm neutral + 4 marathon liner was then  placed into the acetabular shell.       The femoral lift was then placed along the lateral aspect of the femur  just distal to the vastus ridge. The leg was  externally rotated and capsule  was stripped off the inferior aspect of the femoral neck down to the  level of the lesser trochanter, this was done with electrocautery. The femur was lifted after this was performed. The  leg was then placed and extended in adducted position to essentially delivering the femur. We also removed the capsule superiorly and the  piriformis from the piriformis  fossa to gain excellent exposure of the  proximal femur. Rongeur was used to remove some cancellous bone to get  into the lateral portion of the proximal femur for placement of the  initial starter reamer. The starter broaches was placed  the starter broach  and was shown to go down the center of the canal. Broaching  with the  Corail system was then performed starting at size 8, coursing  Up to size 12. A size 12 had excellent torsional and rotational  and axial stability. The trial standard offset neck was then placed  with a 36 + 1.5 trial  head. The hip was then reduced. We confirmed that  the stem was in the canal both on AP and lateral x-rays. It also has excellent sizing. The hip was reduced with outstanding stability through full extension, full external rotation,  and then flexion in adduction internal rotation. AP pelvis was taken  and the leg lengths were measured and found to be exactly equal. Hip  was then dislocated again and the femoral head and neck removed. The  femoral broach was removed. Size 12 Corail stem with a standard offset  neck was then impacted into the femur following native anteversion. Has  excellent purchase in the canal. Excellent torsional and rotational and  axial stability. It is confirmed to be in the canal on AP and lateral  fluoroscopic views. The 36 + 1.5 ceramic head was placed and the hip  reduced with outstanding stability. Again AP pelvis was taken and it  confirmed that the leg lengths were equal. The wound was then copiously  irrigated with saline solution and the capsule reattached and repaired  with Ethibond suture.  20 mL of Exparel mixed with 50 mL of saline then additional 20 ml of .25% Bupivicaine injected into the capsule and into the edge of the tensor fascia lata as well as subcutaneous tissue. The fascia overlying the tensor fascia lata was  then closed with a running #1 V-Loc. Subcu was closed with interrupted  2-0 Vicryl and subcuticular running 4-0 Monocryl. Incision was cleaned  and dried. Steri-Strips and a bulky sterile dressing applied. Hemovac  drain was hooked to suction and then he was awakened and transported to  recovery in stable condition.        Please note that a surgical assistant was a medical necessity for this procedure to perform it in a safe and expeditious manner. Assistant was necessary to provide appropriate retraction of vital neurovascular structures and to prevent femoral fracture and allow for anatomic placement of the prosthesis.  Gaynelle Arabian, M.D.

## 2014-04-20 NOTE — OR Nursing (Signed)
1015: foley placement attempt was unsuccessful; 14 fr and 16 fr foley's were inserted to bifurcation, balloon inflation attempted gingerly=some difficulty; some bloody return. 18 fr coude cath attempted, inserted to bifurcation, no urine returned; catheter withdrawn per surgeon. Some frank blood present from meatus.   1030: urological consult requested from [facility] per surgeon request.

## 2014-04-20 NOTE — Transfer of Care (Signed)
Immediate Anesthesia Transfer of Care Note  Patient: Manuel [name]  Procedure(s) Performed: Procedure(s): RIGHT TOTAL HIP ARTHROPLASTY ANTERIOR APPROACH (Right) CYSTOSCOPY WITH URETHRAL DILATATION AND FOLEY PLACEMENT (N/A)  Patient Location: PACU  Anesthesia Type:General  Level of Consciousness: awake, alert  and oriented  Airway & Oxygen Therapy: Patient Spontanous Breathing and Patient connected to nasal cannula oxygen  Post-op Assessment: Report given to PACU RN, Post -op Vital signs reviewed and stable and Patient moving all extremities X 4  Post vital signs: Reviewed and stable  Complications: No apparent anesthesia complications

## 2014-04-20 NOTE — Progress Notes (Signed)
Utilization review completed.  

## 2014-04-20 NOTE — Progress Notes (Signed)
Patient's output only 50cc in 2 and a half hours. Dr. Jeffie Pollock with urology paged and he said to irrigate the foley with 30-60cc of NS prn. But do not replace or remove catheter. Irrigation done and a clot was removed from tubing. Output increased to >30cc per hour. Will continue to monitor, otherwise stable VSS and pain at a tolerable level.

## 2014-04-20 NOTE — Progress Notes (Signed)
Patient ID: Manuel [name], male   DOB: October 27, 1935, 79 y.o.   MRN: [id number]  I saw the patient in the OR.  He had a prostate stricture which was dilated and a 16 fr council cath was placed after cystoscopy wire placement.     Procedure note # [id number]

## 2014-04-20 NOTE — Anesthesia Preprocedure Evaluation (Addendum)
Anesthesia Evaluation  Patient identified by MRN, date of birth, ID band Patient awake    Reviewed: Allergy & Precautions, H&P , Patient's Chart, lab work & pertinent test results, reviewed documented beta blocker date and time   History of Anesthesia Complications Negative for: history of anesthetic complications  Airway Mallampati: III TM Distance: >3 FB Neck ROM: full    Dental  (+) Dental Advidsory Given, Teeth Intact   Pulmonary  breath sounds clear to auscultation        Cardiovascular Exercise Tolerance: Good hypertension, Rhythm:regular Rate:Normal     Neuro/Psych    GI/Hepatic   Endo/Other  diabetes  Renal/GU Renal disease     Musculoskeletal   Abdominal   Peds  Hematology   Anesthesia Other Findings   Reproductive/Obstetrics                         Anesthesia Physical Anesthesia Plan  ASA: III  Anesthesia Plan: General ETT   Post-op Pain Management:    Induction:   Airway Management Planned:   Additional Equipment:   Intra-op Plan:   Post-operative Plan:   Informed Consent: I have reviewed the patients History and Physical, chart, labs and discussed the procedure including the risks, benefits and alternatives for the proposed anesthesia with the patient or authorized representative who has indicated his/her understanding and acceptance.   Dental Advisory Given  Plan Discussed with: CRNA, Surgeon and Anesthesiologist  Anesthesia Plan Comments:        Anesthesia Quick Evaluation

## 2014-04-21 LAB — CBC
HCT: 31.5 % — ABNORMAL LOW (ref 39.0–52.0)
Hemoglobin: 10 g/dL — ABNORMAL LOW (ref 13.0–17.0)
MCH: 30.8 pg (ref 26.0–34.0)
MCHC: 31.7 g/dL (ref 30.0–36.0)
MCV: 96.9 fL (ref 78.0–100.0)
Platelets: 176 10*3/uL (ref 150–400)
RBC: 3.25 MIL/uL — ABNORMAL LOW (ref 4.22–5.81)
RDW: 14.4 % (ref 11.5–15.5)
WBC: 7 10*3/uL (ref 4.0–10.5)

## 2014-04-21 LAB — BASIC METABOLIC PANEL
BUN: 43 mg/dL — ABNORMAL HIGH (ref 6–23)
CALCIUM: 8.5 mg/dL (ref 8.4–10.5)
CHLORIDE: 103 meq/L (ref 96–112)
CO2: 22 mEq/L (ref 19–32)
Creatinine, Ser: 2.38 mg/dL — ABNORMAL HIGH (ref 0.50–1.35)
GFR calc Af Amer: 29 mL/min — ABNORMAL LOW (ref >90)
GFR calc non Af Amer: 25 mL/min — ABNORMAL LOW (ref >90)
Glucose, Bld: 262 mg/dL — ABNORMAL HIGH (ref 70–99)
Potassium: 5.4 mEq/L — ABNORMAL HIGH (ref 3.7–5.3)
Sodium: 140 mEq/L (ref 137–147)

## 2014-04-21 LAB — GLUCOSE, CAPILLARY
GLUCOSE-CAPILLARY: 240 mg/dL — AB (ref 70–99)
Glucose-Capillary: 205 mg/dL — ABNORMAL HIGH (ref 70–99)
Glucose-Capillary: 265 mg/dL — ABNORMAL HIGH (ref 70–99)
Glucose-Capillary: 270 mg/dL — ABNORMAL HIGH (ref 70–99)
Glucose-Capillary: 307 mg/dL — ABNORMAL HIGH (ref 70–99)

## 2014-04-21 NOTE — Evaluation (Signed)
Physical Therapy Evaluation Patient Details Name: Manuel [name] MRN: [id number] DOB: 05/18/1935 Today's Date: 04/21/2014   History of Present Illness  Pt presents for elective right THA.  Clinical Impression  Pt tolerated evaluation well, ambulated 20' with RW and min-guard A. Recommend HHPT follow up after d/c. Pt will benefit froma cute PT to increase mobility and independence for safe d/c home.    Follow Up Recommendations Home health PT;Supervision - Intermittent    Equipment Recommendations  None recommended by PT    Recommendations for Other Services OT consult     Precautions / Restrictions Precautions Precautions: None Precaution Comments: direct anterior Restrictions Weight Bearing Restrictions: Yes RLE Weight Bearing: Weight bearing as tolerated      Mobility  Bed Mobility Overal bed mobility: Needs Assistance Bed Mobility: Supine to Sit     Supine to sit: Min assist     General bed mobility comments: vc's for rolling onto left side and right leg supported by therapist, vc's for sequencing, min A to achieve upright posture.  Transfers Overall transfer level: Needs assistance Equipment used: Rolling walker (2 wheeled) Transfers: Sit to/from Stand Sit to Stand: Supervision         General transfer comment: vc's for hand placement and supervisoin for safety  Ambulation/Gait Ambulation/Gait assistance: Min guard Ambulation Distance (Feet): 20 Feet Assistive device: Rolling walker (2 wheeled) Gait Pattern/deviations: Step-to pattern;Decreased stance time - right;Decreased weight shift to right Gait velocity: decreased      Stairs            Wheelchair Mobility    Modified Rankin (Stroke Patients Only)       Balance Overall balance assessment: No apparent balance deficits (not formally assessed)                                           Pertinent Vitals/Pain Pain 2/10 after session with ice applied    Home Living  Family/patient expects to be discharged to:: Private residence Living Arrangements: Spouse/significant other Available Help at Discharge: Friend(s);Available PRN/intermittently;Family Type of Home: House Home Access: Stairs to enter Entrance Stairs-Rails: None Entrance Stairs-Number of Steps: 1 Home Layout: One level Home Equipment: Grab bars - toilet;Walker - 2 wheels Additional Comments: pt's wife cannot assist him physically    Prior Function Level of Independence: Independent               Hand Dominance        Extremity/Trunk Assessment   Upper Extremity Assessment: Overall WFL for tasks assessed           Lower Extremity Assessment: RLE deficits/detail RLE Deficits / Details: unable to life leg or extend knee against gravity this morning, good quad contraction with leg in extension though    Cervical / Trunk Assessment: Normal  Communication   Communication: No difficulties  Cognition Arousal/Alertness: Awake/alert Behavior During Therapy: WFL for tasks assessed/performed Overall Cognitive Status: Within Functional Limits for tasks assessed                      General Comments      Exercises Total Joint Exercises Ankle Circles/Pumps: AROM;Both;20 reps;Seated Quad Sets: AROM;Both;20 reps;Seated Gluteal Sets: AROM;Both;20 reps;Seated      Assessment/Plan    PT Assessment Patient needs continued PT services  PT Diagnosis Difficulty walking;Acute pain;Abnormality of gait   PT Problem List Decreased strength;Decreased activity  tolerance;Decreased mobility;Decreased knowledge of use of DME;Decreased knowledge of precautions;Pain  PT Treatment Interventions DME instruction;Gait training;Stair training;Functional mobility training;Therapeutic activities;Therapeutic exercise;Patient/family education   PT Goals (Current goals can be found in the Care Plan section) Acute Rehab PT Goals Patient Stated Goal: return home PT Goal Formulation: With  patient Time For Goal Achievement: 04/28/14 Potential to Achieve Goals: Good    Frequency 7X/week   Barriers to discharge Decreased caregiver support      Co-evaluation               End of Session Equipment Utilized During Treatment: Gait belt Activity Tolerance: Patient tolerated treatment well Patient left: in chair;with call bell/phone within reach Nurse Communication: Mobility status         Time: 5643-3295 PT Time Calculation (min): 39 min   Charges:   PT Evaluation $Initial PT Evaluation Tier I: 1 Procedure PT Treatments $Gait Training: 8-22 mins $Therapeutic Activity: 8-22 mins   PT G Codes:        Leighton Roach, PT  Acute Rehab Services  [place] 04/21/2014, 9:55 AM

## 2014-04-21 NOTE — Progress Notes (Signed)
Patient ID: Manuel [name], male   DOB: 1935/10/26, 79 y.o.   MRN: [id number] 1 Day Post-Op  Subjective: Mr. Prichett had a foley placed yesterday after cystoscopy and dilation of a prostatic stricture.  He had a false passage in the left bulbar urethra from the catheter attempt prior to my arrival.  He is doing well today and the urine is clear.    ROS:  Review of Systems  Constitutional: Negative for fever and chills.  Gastrointestinal: Negative for abdominal pain.  Genitourinary: Negative for hematuria.    Anti-infectives: Anti-infectives   Start     Dose/Rate Route Frequency Ordered Stop   04/20/14 1800  ceFAZolin (ANCEF) IVPB 2 g/50 mL premix     2 g 100 mL/hr over 30 Minutes Intravenous Every 6 hours 04/20/14 1711 04/21/14 0123   04/20/14 0600  ceFAZolin (ANCEF) IVPB 2 g/50 mL premix     2 g 100 mL/hr over 30 Minutes Intravenous On call to O.R. 04/19/14 1409 04/20/14 0957      Current Facility-Administered Medications  Medication Dose Route Frequency Provider Last Rate Last Dose  . 0.9 %  sodium chloride infusion   Intravenous Continuous Gearlean Alf, MD 100 mL/hr at 04/20/14 2212    . acetaminophen (TYLENOL) tablet 650 mg  650 mg Oral Q6H PRN Gearlean Alf, MD       Or  . acetaminophen (TYLENOL) suppository 650 mg  650 mg Rectal Q6H PRN Gearlean Alf, MD      . acetaminophen (TYLENOL) tablet 1,000 mg  1,000 mg Oral 4 times per day Gearlean Alf, MD   1,000 mg at 04/21/14 0518  . amLODipine (NORVASC) tablet 5 mg  5 mg Oral Daily Gearlean Alf, MD   5 mg at 04/21/14 1002  . bisacodyl (DULCOLAX) suppository 10 mg  10 mg Rectal Daily PRN Gearlean Alf, MD      . diphenhydrAMINE (BENADRYL) 12.5 MG/5ML elixir 12.5-25 mg  12.5-25 mg Oral Q4H PRN Gearlean Alf, MD      . docusate sodium (COLACE) capsule 100 mg  100 mg Oral BID Gearlean Alf, MD   100 mg at 04/21/14 1002  . enoxaparin (LOVENOX) injection 40 mg  40 mg Subcutaneous Q24H Gearlean Alf, MD   40 mg at  04/21/14 1001  . fenofibrate tablet 160 mg  160 mg Oral Daily Gearlean Alf, MD   160 mg at 04/20/14 1856  . gabapentin (NEURONTIN) tablet 600 mg  600 mg Oral 6 X Daily Gearlean Alf, MD   600 mg at 04/21/14 1001  . glimepiride (AMARYL) tablet 2 mg  2 mg Oral QAC breakfast Gearlean Alf, MD   2 mg at 04/21/14 1001  . insulin aspart (novoLOG) injection 0-15 Units  0-15 Units Subcutaneous TID WC Gearlean Alf, MD   5 Units at 04/21/14 0751  . menthol-cetylpyridinium (CEPACOL) lozenge 3 mg  1 lozenge Oral PRN Gearlean Alf, MD       Or  . phenol (CHLORASEPTIC) mouth spray 1 spray  1 spray Mouth/Throat PRN Gearlean Alf, MD      . methocarbamol (ROBAXIN) tablet 500 mg  500 mg Oral Q6H PRN Gearlean Alf, MD   500 mg at 04/20/14 2026   Or  . methocarbamol (ROBAXIN) 500 mg in dextrose 5 % 50 mL IVPB  500 mg Intravenous Q6H PRN Gearlean Alf, MD   500 mg at 04/20/14 1318  . metoCLOPramide (REGLAN)  tablet 5-10 mg  5-10 mg Oral Q8H PRN Gearlean Alf, MD       Or  . metoCLOPramide (REGLAN) injection 5-10 mg  5-10 mg Intravenous Q8H PRN Gearlean Alf, MD      . morphine 2 MG/ML injection 1-2 mg  1-2 mg Intravenous Q1H PRN Gearlean Alf, MD      . ondansetron [facility]) tablet 4 mg  4 mg Oral Q6H PRN Gearlean Alf, MD       Or  . ondansetron [facility]) injection 4 mg  4 mg Intravenous Q6H PRN Gearlean Alf, MD      . oxyCODONE (Oxy IR/ROXICODONE) immediate release tablet 5-10 mg  5-10 mg Oral Q3H PRN Gearlean Alf, MD   10 mg at 04/21/14 0908  . polyethylene glycol (MIRALAX / GLYCOLAX) packet 17 g  17 g Oral Daily PRN Gearlean Alf, MD      . promethazine (PHENERGAN) tablet 25 mg  25 mg Oral Q6H PRN Gearlean Alf, MD      . rivastigmine (EXELON) capsule 3 mg  3 mg Oral BID Gearlean Alf, MD   3 mg at 04/21/14 1001  . traMADol (ULTRAM) tablet 50-100 mg  50-100 mg Oral Q6H PRN Gearlean Alf, MD         Objective: Vital signs in last 24 hours: Temp:  [97.4 F (36.3  C)-97.8 F (36.6 C)] 97.8 F (36.6 C) (04/25 0557) Pulse Rate:  [57-85] 85 (04/25 0557) Resp:  [8-18] 18 (04/25 0557) BP: (95-193)/(51-66) 98/64 mmHg (04/25 0557) SpO2:  [93 %-100 %] 94 % (04/25 0557)  Intake/Output from previous day: 04/24 0701 - 04/25 0700 In: 1640 [P.O.:240; I.V.:1400] Out: 2500 [Urine:1750; Drains:450; Blood:300] Intake/Output this shift: Total I/O In: 360 [P.O.:360] Out: -    Physical Exam  Constitutional: He is well-developed, well-nourished, and in no distress. No distress.  Genitourinary:  Foley is draining clear urine.       Lab Results:   Recent Labs  04/21/14 0705  WBC 7.0  HGB 10.0*  HCT 31.5*  PLT 176   BMET  Recent Labs  04/21/14 0705  NA 140  K 5.4*  CL 103  CO2 22  GLUCOSE 262*  BUN 43*  CREATININE 2.38*  CALCIUM 8.5   PT/INR No results found for this basename: LABPROT, INR,  in the last 72 hours ABG No results found for this basename: PHART, PCO2, PO2, HCO3,  in the last 72 hours  Studies/Results: Dg Hip Operative Right  04/20/2014   CLINICAL DATA:  Right hip replacement.  EXAM: DG OPERATIVE RIGHT HIP  TECHNIQUE: A single spot fluoroscopic AP image of the right hip is submitted.  COMPARISON:  Single view of the pelvis 04/10/2014.  FINDINGS: We are provided with 2 fluoroscopic intraoperative spot views of the right hip. Images demonstrate severe right hip osteoarthritis. Radiotherapy seeds for prostate cancer are noted.  IMPRESSION: Right hip replacement progress.   Electronically Signed   By: Inge Rise M.D.   On: 04/20/2014 13:44   Dg Pelvis Portable  04/20/2014   CLINICAL DATA:  79 year old male status post right hip surgery. Initial encounter.  EXAM: PORTABLE PELVIS 1-2 VIEWS  COMPARISON:  Intraoperative images from 10/20 9 hr the same day.  FINDINGS: Portable AP view at 1258 hrs. New right bipolar hip arthroplasty in place. Hardware appears intact. Normal alignment on this single view.  Chronic postoperative/post  brachy therapy changes to the region of the prostate and scrotum.  Bladder catheter in place, small volume of gas within the bladder. Advanced degenerative changes also noted at the left hip.  IMPRESSION: Right hip arthroplasty with no adverse features.   Electronically Signed   By: Lars Pinks M.D.   On: 04/20/2014 13:08     Assessment: s/p Procedure(s): RIGHT TOTAL HIP ARTHROPLASTY ANTERIOR APPROACH CYSTOSCOPY WITH URETHRAL DILATATION AND FOLEY PLACEMENT  He has a prostatic urethra stricture with prostatic stones and had a traumatic foley insertion with a bulbar false passage.  The foley is draining well and the urine is clear.  Plan: I would like for him to keep the foley for a week to allow the urethra to heal.  I will set him up to come to my office in [place] on Friday to have the catheter removed.  If it would be prudent for him to be on antibiotic coverage while the foley is in place, please choose an appropriate agent for his recent hip surgery.   CC: Dr. Hector Shade.     LOS: 1 day    Irine Seal 04/21/2014

## 2014-04-21 NOTE — Progress Notes (Signed)
Physical Therapy Treatment Patient Details Name: Manuel [name] MRN: [id number] DOB: July 10, 1935 Today's Date: 04/21/2014    History of Present Illness Pt presents for elective right THA.    PT Comments    Pt progressing well with mobility, ambulated 34' with RW and min-guard A. Also progressing with independence with transfers which he will need for d/c home as his wife is unable to assist him physically. PT will continue to follow.   Follow Up Recommendations  Home health PT;Supervision - Intermittent     Equipment Recommendations  None recommended by PT    Recommendations for Other Services       Precautions / Restrictions Precautions Precautions: None Precaution Comments: direct anterior Restrictions Weight Bearing Restrictions: Yes RLE Weight Bearing: Weight bearing as tolerated    Mobility  Bed Mobility Overal bed mobility: Needs Assistance Bed Mobility: Sit to Supine       Sit to supine: Min assist   General bed mobility comments: min A to lift right leg for back into bed. Pt able to use rails to scoot to head of bed  Transfers Overall transfer level: Needs assistance Equipment used: Rolling walker (2 wheeled) Transfers: Sit to/from Stand Sit to Stand: Min assist         General transfer comment: min A to steady with standing from chair, uses momentum from low surface  Ambulation/Gait Ambulation/Gait assistance: Min guard Ambulation Distance (Feet): 60 Feet Assistive device: Rolling walker (2 wheeled) Gait Pattern/deviations: Step-to pattern;Decreased step length - right;Decreased stance time - right;Decreased weight shift to right Gait velocity: decreased   General Gait Details: pt reports pain with [place] on right, antalgia noted. Pt tolerating increased distance   Stairs            Wheelchair Mobility    Modified Rankin (Stroke Patients Only)       Balance Overall balance assessment: No apparent balance deficits (not formally  assessed)                                  Cognition Arousal/Alertness: Awake/alert Behavior During Therapy: WFL for tasks assessed/performed Overall Cognitive Status: Within Functional Limits for tasks assessed                      Exercises Total Joint Exercises Ankle Circles/Pumps: AROM;Both;20 reps;Seated Quad Sets: AROM;Both;20 reps;Seated Gluteal Sets: AROM;Both;20 reps;Seated Heel Slides: AAROM;Right;10 reps;Supine Hip ABduction/ADduction: AAROM;Right;10 reps;Seated Straight Leg Raises: AAROM;Right;10 reps;Seated Long Arc Quad: AAROM;5 reps;Seated;Right    General Comments        Pertinent Vitals/Pain VSS    Home Living                      Prior Function            PT Goals (current goals can now be found in the care plan section) Acute Rehab PT Goals Patient Stated Goal: wants to go home tomorrow PT Goal Formulation: With patient Time For Goal Achievement: 04/28/14 Potential to Achieve Goals: Good Progress towards PT goals: Progressing toward goals    Frequency  7X/week    PT Plan Current plan remains appropriate    Co-evaluation             End of Session Equipment Utilized During Treatment: Gait belt Activity Tolerance: Patient tolerated treatment well Patient left: in bed;with call bell/phone within reach;with family/visitor present     Time: 1607-3710  PT Time Calculation (min): 30 min  Charges:  $Gait Training: 8-22 mins $Therapeutic Exercise: 8-22 mins                    G Codes:     Leighton Roach, PT  Acute Rehab Services  [place] 04/21/2014, 3:37 PM

## 2014-04-21 NOTE — Care Management Note (Signed)
CARE MANAGEMENT NOTE 04/21/2014  Patient:  Manuel [name]   Account Number:  [id number]  Date Initiated:  04/21/2014  Documentation initiated by:  Ricki Miller  Subjective/Objective Assessment:   79 yr old male s/p right total hip arthroplasty and cystoscopy with uretheral dilitation.     Action/Plan:   Case manager spoke with patient concerning home health and DME needs at discharge. Choice offered. CM will fax orders to [facility]. Patient has rolling walker and 3in1.Has family support at discharge.   Anticipated DC Date:  04/22/2014   Anticipated DC Plan:  [place]  CM consult      [facility] Choice  HOME HEALTH   Choice offered to / List presented to:  C-1 Patient   DME arranged  NA        [place] arranged  [facility]      [place].   Status of service:  Completed, signed off Medicare Important Message given?   (If response is "NO", the following Medicare IM given date fields will be blank) Date Medicare IM given:   Date Additional Medicare IM given:    Discharge Disposition:  [place]

## 2014-04-21 NOTE — Evaluation (Signed)
Occupational Therapy Evaluation Patient Details Name: Manuel [name] MRN: [id number] DOB: July 04, 1935 Today's Date: 04/21/2014    History of Present Illness Pt presents for elective right THA.   Clinical Impression   Pt s/p above procedure. Pt independent with ADLs, PTA. Feel pt will benefit from acute OT to increase independence prior to d/c.     Follow Up Recommendations  No OT follow up;Supervision - Intermittent    Equipment Recommendations  None recommended by OT    Recommendations for Other Services       Precautions / Restrictions Precautions Precautions: None Precaution Comments: direct anterior Restrictions Weight Bearing Restrictions: Yes RLE Weight Bearing: Weight bearing as tolerated      Mobility  Transfers Overall transfer level: Needs assistance Equipment used: Rolling walker (2 wheeled) Transfers: Sit to/from Stand Sit to Stand: Min assist         General transfer comment: Min A to stand. Cues for technique.         ADL Overall ADL's : Needs assistance/impaired                     Lower Body Dressing: Min guard;Sit to/from stand;With adaptive equipment   Toilet Transfer: Min guard;Ambulation;RW (chair)           Functional mobility during ADLs: Min guard;Rolling walker General ADL Comments: Educated on safety tips for home. Recommended sitting for bathing and discussed/demonstrated different tub and shower transfer techniques. Pt practiced with reacher doffing sock-OT educated on how to use reacher to get LB clothing over feet. Pt is familiar with AE for LB ADLs.      Vision                     Perception     Praxis      Pertinent Vitals/Pain Pain 6-7/10. Increased activity during session. Repositioned.      Hand Dominance     Extremity/Trunk Assessment Upper Extremity Assessment Upper Extremity Assessment: Overall WFL for tasks assessed   Lower Extremity Assessment Lower Extremity Assessment: Defer to PT  evaluation RLE Deficits / Details: unable to life leg or extend knee against gravity this morning, good quad contraction with leg in extension though   Cervical / Trunk Assessment Cervical / Trunk Assessment: Normal   Communication Communication Communication: No difficulties   Cognition Arousal/Alertness: Awake/alert Behavior During Therapy: WFL for tasks assessed/performed Overall Cognitive Status: Within Functional Limits for tasks assessed                     General Comments          Shoulder Instructions      Home Living Family/patient expects to be discharged to:: Private residence Living Arrangements: Spouse/significant other Available Help at Discharge: Friend(s);Available PRN/intermittently;Family Type of Home: House Home Access: Stairs to enter [employer] of Steps: 1 Entrance Stairs-Rails: None Home Layout: One level     Bathroom Shower/Tub: Tub/shower unit;Walk-in shower Shower/tub characteristics: Curtain (curtain on walk-in shower) Bathroom Toilet: Handicapped height Bathroom Accessibility: Yes How Accessible: Accessible via walker Home Equipment: Grab bars - toilet;Walker - 2 wheels (grab bar frame is on toilet)   Additional Comments: pt's wife cannot assist him physically      Prior Functioning/Environment Level of Independence: Independent             OT Diagnosis: Acute pain   OT Problem List: Decreased strength;Decreased range of motion;Decreased activity tolerance;Pain;Decreased knowledge of use of DME or AE;Decreased knowledge  of precautions   OT Treatment/Interventions: Self-care/ADL training;DME and/or AE instruction;Therapeutic activities;Patient/family education;Balance training    OT Goals(Current goals can be found in the care plan section) Acute Rehab OT Goals Patient Stated Goal: not stated OT Goal Formulation: With patient Time For Goal Achievement: 04/28/14 Potential to Achieve Goals: Good ADL Goals Pt  Will Perform Lower Body Dressing: with modified independence;sit to/from stand;with adaptive equipment Pt Will Transfer to Toilet: with modified independence;ambulating;grab bars (comfort height) Pt Will Perform Toileting - Clothing Manipulation and hygiene: with modified independence;sit to/from stand Pt Will Perform Tub/Shower Transfer: Shower transfer;Tub transfer;with supervision;ambulating;rolling walker (tub/shower equipment tbd)  OT Frequency: Min 2X/week   Barriers to D/C:            Co-evaluation              End of Session Equipment Utilized During Treatment: Gait belt;Rolling walker  Activity Tolerance: Patient tolerated treatment well Patient left: in chair;with call bell/phone within reach   Time: 1009-1035 OT Time Calculation (min): 26 min Charges:  OT General Charges $OT Visit: 1 Procedure OT Evaluation $Initial OT Evaluation Tier I: 1 Procedure OT Treatments $Self Care/Home Management : 8-22 mins G-Codes:    Benito Mccreedy OTR/L [phone] 04/21/2014, 11:44 AM

## 2014-04-21 NOTE — Progress Notes (Signed)
Orthopedics Progress Note  Subjective: Patient had a rough time getting up to the chair this AM. Took a few steps around the bed.  Objective:  Filed Vitals:   04/21/14 0557  BP: 98/64  Pulse: 85  Temp: 97.8 F (36.6 C)  Resp: 18    General: Awake and alert  Musculoskeletal: right hip, dressing intact, drain pulled, leg length equal, NVI distally Neurovascularly intact  Lab Results  Component Value Date   WBC 7.0 04/21/2014   HGB 10.0* 04/21/2014   HCT 31.5* 04/21/2014   MCV 96.9 04/21/2014   PLT 176 04/21/2014       Component Value Date/Time   NA 140 04/21/2014 0705   K 5.4* 04/21/2014 0705   CL 103 04/21/2014 0705   CO2 22 04/21/2014 0705   GLUCOSE 262* 04/21/2014 0705   BUN 43* 04/21/2014 0705   CREATININE 2.38* 04/21/2014 0705   CALCIUM 8.5 04/21/2014 0705   GFRNONAA 25* 04/21/2014 0705   GFRAA 29* 04/21/2014 0705    Lab Results  Component Value Date   INR 0.96 04/10/2014    Assessment/Plan: POD #1 s/p Procedure(s): RIGHT TOTAL HIP ARTHROPLASTY ANTERIOR APPROACH CYSTOSCOPY WITH URETHRAL DILATATION AND FOLEY PLACEMENT Per Dr Jeffie Pollock, Foley will remain in to allow healing of the urethra.  Due to pain and difficulty moving will plan D/C for tomorrow to allow for pain control and some more therapy.  Doran Heater. Veverly Fells, MD 04/21/2014 9:08 AM

## 2014-04-21 NOTE — Op Note (Signed)
NAME:  STEPHANE, Manuel [name] NO.:  [id number]  MEDICAL RECORD NO.:  [id number]  LOCATION:  [id number]                        FACILITY:  [facility]  PHYSICIAN:  Marshall Cork. Jeffie Pollock, M.D.    DATE OF BIRTH:  March 16, 1935  DATE OF PROCEDURE:  04/20/2014 DATE OF DISCHARGE:                              OPERATIVE REPORT   PROCEDURE:  Cystoscopy with urethral dilation and complicated Foley catheter placement.  PREOPERATIVE DIAGNOSIS:  Probable urethral stricture.  POSTOPERATIVE DIAGNOSIS:  Prostatic urethral stricture with prostatic stones related to prior seed implant.  SURGEON:  Marshall Cork. Jeffie Pollock, MD  ANESTHESIA:  General.  SPECIMEN:  None.  DRAINS:  A 16-French Councill catheter.  COMPLICATIONS:  None.  INDICATIONS:  [name] is a 79 year old white male who was undergoing a right hip arthroplasty today.  At the initiation of the procedure, attempts were made to place a Foley catheter.  A 14, a 16, and a 16 coude were attempted without success with return of blood.  I was asked to see the patient.  The surgical procedure was then completed.  FINDINGS OF PROCEDURE:  He was still on the orthopedic frame.  His genitalia was prepped with Betadine solution.  He was draped with sterile towels.  An initial attempt to place a 16-French coude catheter after thorough urethral lubrication was unsuccessful.  I then assembled the flexible cystoscope and passed this per urethra. The anterior urethra  was unremarkable, but in the proximal bulb, there was a false passage to the patient's left, to the right was the membranous urethra.  I was able to view into the prostatic urethra. There were 2 large calcifications at the apex.  I was not able to get the 16-French scope through the prostate.  A guidewire was then passed through the scope into the bladder and the cystoscope was removed.  The urethra was dilated with Nikki Dom sounds over the wire up to 24-French. There was some difficulty with the  dilation due to the presence of the post from the orthopedic fraying, but I felt that I sufficiently dilated the urethra to place a catheter.  A 16-French Councill catheter was then advanced over the wire into the bladder.  The wire was removed and the balloon was filled with 10 mL of sterile fluid.  There was return of blood-tinged urine.  At this point, the catheter was placed to straight drainage.  The patient's anesthetic was reversed.  He was moved to recovery room in stable condition.  There were no complications in my portion of the procedure.     Marshall Cork. Jeffie Pollock, M.D.     JJW/MEDQ  D:  04/20/2014  T:  04/21/2014  Job:  [id number]  cc:   Gaynelle Arabian, M.D.

## 2014-04-22 LAB — BASIC METABOLIC PANEL
BUN: 47 mg/dL — ABNORMAL HIGH (ref 6–23)
CO2: 22 mEq/L (ref 19–32)
Calcium: 8.2 mg/dL — ABNORMAL LOW (ref 8.4–10.5)
Chloride: 103 mEq/L (ref 96–112)
Creatinine, Ser: 2.2 mg/dL — ABNORMAL HIGH (ref 0.50–1.35)
GFR, EST AFRICAN AMERICAN: 31 mL/min — AB (ref >90)
GFR, EST NON AFRICAN AMERICAN: 27 mL/min — AB (ref >90)
GLUCOSE: 185 mg/dL — AB (ref 70–99)
Potassium: 4.8 mEq/L (ref 3.7–5.3)
SODIUM: 138 meq/L (ref 137–147)

## 2014-04-22 LAB — CBC
HCT: 29.6 % — ABNORMAL LOW (ref 39.0–52.0)
HEMOGLOBIN: 9.5 g/dL — AB (ref 13.0–17.0)
MCH: 30.9 pg (ref 26.0–34.0)
MCHC: 32.1 g/dL (ref 30.0–36.0)
MCV: 96.4 fL (ref 78.0–100.0)
Platelets: 142 10*3/uL — ABNORMAL LOW (ref 150–400)
RBC: 3.07 MIL/uL — ABNORMAL LOW (ref 4.22–5.81)
RDW: 14.3 % (ref 11.5–15.5)
WBC: 5.8 10*3/uL (ref 4.0–10.5)

## 2014-04-22 LAB — GLUCOSE, CAPILLARY
Glucose-Capillary: 139 mg/dL — ABNORMAL HIGH (ref 70–99)
Glucose-Capillary: 164 mg/dL — ABNORMAL HIGH (ref 70–99)

## 2014-04-22 MED ORDER — OXYCODONE HCL 5 MG PO TABS: 5.0000 mg | ORAL_TABLET | ORAL | Status: DC | PRN

## 2014-04-22 MED ORDER — TRAMADOL HCL 50 MG PO TABS: 50.0000 mg | ORAL_TABLET | Freq: Four times a day (QID) | ORAL | Status: DC | PRN

## 2014-04-22 MED ORDER — ENOXAPARIN SODIUM 40 MG/0.4ML ~~LOC~~ SOLN: 40.0000 mg | SUBCUTANEOUS | Status: DC

## 2014-04-22 MED ORDER — METHOCARBAMOL 500 MG PO TABS
500.0000 mg | ORAL_TABLET | Freq: Four times a day (QID) | ORAL | Status: DC | PRN
Start: 2014-04-22 — End: 2015-01-22

## 2014-04-22 NOTE — Progress Notes (Signed)
Occupational Therapy Treatment Patient Details Name: ARDITH LEWMAN MRN: [id number] DOB: 01-25-1935 Today's Date: 04/22/2014    History of present illness Pt presents for elective right THA.   OT comments  Education provided during session on safety as well as shower transfer.   Follow Up Recommendations  No OT follow up;Supervision - Intermittent    Equipment Recommendations  None recommended by OT    Recommendations for Other Services      Precautions / Restrictions Precautions Precautions: None Precaution Comments: direct anterior Restrictions Weight Bearing Restrictions: Yes RLE Weight Bearing: Weight bearing as tolerated       Mobility Bed Mobility                  Transfers Overall transfer level: Needs assistance Equipment used: Rolling walker (2 wheeled) Transfers: Sit to/from Stand Sit to Stand: Min guard         General transfer comment: cues for technique. Pt with decreased control descent when going to sitting.    Balance                                   ADL Overall ADL's : Needs assistance/impaired                         Toilet Transfer: Minimal assistance;Ambulation;RW;Min guard (3 in 1 over toilet)           Functional mobility during ADLs: Minimal assistance;Min guard;Rolling walker General ADL Comments: Discussed shower transfer and OT demonstrated. OT recommended sitting for bathing on shower chair. Pt agreed to practice shower transfer with [facility] therapist. Pt verbalizes he feels good with AE for LB ADLs. Reviewed safe shoe wear.      Vision                     Perception     Praxis      Cognition   Behavior During Therapy: [facility] for tasks assessed/performed Overall Cognitive Status: Within Functional Limits for tasks assessed                       Extremity/Trunk Assessment               Exercises     Shoulder Instructions       General Comments      Pertinent  Vitals/ Pain       Pain 4-5/10. Increased activity during session.    Home Living                                          Prior Functioning/Environment              Frequency Min 2X/week     Progress Toward Goals  OT Goals(current goals can now be found in the care plan section)  Progress towards OT goals: Progressing toward goals (transfers)  Acute Rehab OT Goals Patient Stated Goal: go home OT Goal Formulation: With patient Time For Goal Achievement: 04/28/14 Potential to Achieve Goals: Good ADL Goals Pt Will Perform Lower Body Dressing: with modified independence;sit to/from stand;with adaptive equipment Pt Will Transfer to Toilet: with modified independence;ambulating;grab bars (comfort height) Pt Will Perform Toileting - Clothing Manipulation and hygiene: with modified independence;sit to/from stand Pt Will Perform Tub/Shower Transfer:  Shower transfer;Tub transfer;with supervision;ambulating;rolling walker (tub/shower equipment tbd)  Plan Discharge plan remains appropriate    Co-evaluation                 End of Session Equipment Utilized During Treatment: Rolling walker;Gait belt   Activity Tolerance Patient tolerated treatment well   Patient Left in chair;with call bell/phone within reach   Nurse Communication          Time: [phone] OT Time Calculation (min): 17 min  Charges: OT General Charges $OT Visit: 1 Procedure OT Treatments $Self Care/Home Management : 8-22 mins  Benito Mccreedy OTR/L [phone] 04/22/2014, 10:14 AM

## 2014-04-22 NOTE — Discharge Instructions (Signed)
°Dr. Alistar Mcenery °Total Joint Specialist °[facility] °[address] °[place], [place] [address] °[phone] ° ° ° °ANTERIOR APPROACH TOTAL HIP REPLACEMENT POSTOPERATIVE DIRECTIONS ° ° °Hip Rehabilitation, Guidelines Following Surgery  °The results of a hip operation are greatly improved after range of motion and muscle strengthening exercises. Follow all safety measures which are given to protect your hip. If any of these exercises cause increased pain or swelling in your joint, decrease the amount until you are comfortable again. Then slowly increase the exercises. Call your caregiver if you have problems or questions.  °HOME CARE INSTRUCTIONS  °Most of the following instructions are designed to prevent the dislocation of your new hip.  °Remove items at home which could result in a fall. This includes throw rugs or furniture in walking pathways.  °Continue medications as instructed at time of discharge. °· You may have some home medications which will be placed on hold until you complete the course of blood thinner medication. °· You may start showering once you are discharged home but do not submerge the incision under water. Just pat the incision dry and apply a dry gauze dressing on daily. °Do not put on socks or shoes without following the instructions of your caregivers.  °Sit on high chairs which makes it easier to stand.  °Sit on chairs with arms. Use the chair arms to help push yourself up when arising.  °Keep your leg on the side of the operation out in front of you when standing up.  °Arrange for the use of a toilet seat elevator so you are not sitting low.   °· Walk with walker as instructed.  °You may resume a sexual relationship in one month or when given the OK by your caregiver.  °Use walker as long as suggested by your caregivers.  °You may put full weight on your legs and walk as much as is comfortable. °Avoid periods of inactivity such as sitting longer than an hour  when not asleep. This helps prevent blood clots.  °You may return to work once you are cleared by your surgeon.  °Do not drive a car for 6 weeks or until released by your surgeon.  °Do not drive while taking narcotics.  °Wear elastic stockings for three weeks following surgery during the day but you may remove then at night.  °Make sure you keep all of your appointments after your operation with all of your doctors and caregivers. You should call the office at the above phone number and make an appointment for approximately two weeks after the date of your surgery. °Change the dressing daily and reapply a dry dressing each time. °Please pick up a stool softener and laxative for home use as long as you are requiring pain medications. °· Continue to use ice on the hip for pain and swelling from surgery. You may notice swelling that will progress down to the foot and ankle.  This is normal after  surgery.  Elevate the leg when you are not up walking on it.   °It is important for you to complete the blood thinner medication as prescribed by your doctor. °· Continue to use the breathing machine which will help keep your temperature down.  It is common for your temperature to cycle up and down following surgery, especially at night when you are not up moving around and exerting yourself.  The breathing machine keeps your lungs expanded and your temperature down. ° °RANGE OF MOTION AND STRENGTHENING EXERCISES  °  These exercises are designed to help you keep full movement of your hip joint. Follow your caregiver's or physical therapist's instructions. Perform all exercises about fifteen times, three times per day or as directed. Exercise both hips, even if you have had only one joint replacement. These exercises can be done on a training (exercise) mat, on the floor, on a table or on a bed. Use whatever works the best and is most comfortable for you. Use music or television while you are exercising so that the exercises are  a pleasant break in your day. This will make your life better with the exercises acting as a break in routine you can look forward to.  Lying on your back, slowly slide your foot toward your buttocks, raising your knee up off the floor. Then slowly slide your foot back down until your leg is straight again.  Lying on your back spread your legs as far apart as you can without causing discomfort.  Lying on your side, raise your upper leg and foot straight up from the floor as far as is comfortable. Slowly lower the leg and repeat.  Lying on your back, tighten up the muscle in the front of your thigh (quadriceps muscles). You can do this by keeping your leg straight and trying to raise your heel off the floor. This helps strengthen the largest muscle supporting your knee.  Lying on your back, tighten up the muscles of your buttocks both with the legs straight and with the knee bent at a comfortable angle while keeping your heel on the floor.   SKILLED REHAB INSTRUCTIONS: If the patient is transferred to a skilled rehab facility following release from the hospital, a list of the current medications will be sent to the facility for the patient to continue.  When discharged from the skilled rehab facility, please have the facility set up the patient's [place] prior to being released. Also, the skilled facility will be responsible for providing the patient with their medications at time of release from the facility to include their pain medication, the muscle relaxants, and their blood thinner medication. If the patient is still at the rehab facility at time of the two week follow up appointment, the skilled rehab facility will also need to assist the patient in arranging follow up appointment in our office and any transportation needs.  MAKE SURE YOU:  Understand these instructions.  Will watch your condition.  Will get help right away if you are not doing well or get worse.  Pick up  stool softner and laxative for home. Do not submerge incision under water. May shower. Continue to use ice for pain and swelling from surgery. Total Hip Protocol.  DO YOUR LOVENOX INJECTIONS TO PREVENT BLOOD CLOTS. ONCE YOU COMPLETE THE INJECTIONS TAKE A 325 MG ASPIRIN DAILY FOR 3 WEEKS. YOU CAN STOP THE 81 MG ASPIRIN WHILE TAKING THE 325 MG ASPIRIN.

## 2014-04-22 NOTE — Progress Notes (Signed)
Physical Therapy Treatment Patient Details Name: Manuel [name] MRN: [id number] DOB: 09/18/1935 Today's Date: 04/22/2014    History of Present Illness Pt presents for elective right THA.    PT Comments    Patient ambulating well, performed tranfers with no physical assist, discussed simulated car transfer as well as step negotiation. Address all concerns for patient mobility as well as expectations for mobility upon discharge. Feel patient will be safe for dc home.  Follow Up Recommendations  Home health PT;Supervision - Intermittent     Equipment Recommendations  None recommended by PT    Recommendations for Other Services       Precautions / Restrictions Precautions Precautions: None Precaution Comments: direct anterior Restrictions Weight Bearing Restrictions: Yes RLE Weight Bearing: Weight bearing as tolerated    Mobility  Bed Mobility               General bed mobility comments: recieved in chair  Transfers Overall transfer level: Needs assistance Equipment used: Rolling walker (2 wheeled) Transfers: Sit to/from Stand Sit to Stand: Supervision         General transfer comment: performed multiple times to ensure safety, when cued, no difficulty  Ambulation/Gait Ambulation/Gait assistance: Supervision Ambulation Distance (Feet): 360 Feet Assistive device: Rolling walker (2 wheeled) Gait Pattern/deviations: Step-through pattern;Decreased stride length;Narrow base of support Gait velocity: improved but still slightly decreased Gait velocity interpretation: Below normal speed for age/gender     Stairs            Wheelchair Mobility    Modified Rankin (Stroke Patients Only)       Balance Overall balance assessment: No apparent balance deficits (not formally assessed)                                  Cognition Arousal/Alertness: Awake/alert Behavior During Therapy: WFL for tasks assessed/performed Overall Cognitive Status:  Within Functional Limits for tasks assessed                      Exercises      General Comments General comments (skin integrity, edema, etc.): educated patient on mobility expectations and safeyt upon discharge. Reviewed car transfers with patient who verbalizes understanding.  Discussed safety management at home and adressed all patient concerns.  Feel patient is mobilizing well today.  Patient able to perform trasnfers without assist. Educated patient on step negotiation with use of RW.       Pertinent Vitals/Pain 4/10    Home Living                      Prior Function            PT Goals (current goals can now be found in the care plan section) Acute Rehab PT Goals Patient Stated Goal: to go home today PT Goal Formulation: With patient Time For Goal Achievement: 04/28/14 Potential to Achieve Goals: Good Progress towards PT goals: Progressing toward goals    Frequency  7X/week    PT Plan Current plan remains appropriate    Co-evaluation             End of Session Equipment Utilized During Treatment: Gait belt Activity Tolerance: Patient tolerated treatment well Patient left: in chair;with call bell/phone within reach     Time: 1001-1025 PT Time Calculation (min): 24 min  Charges:  $Gait Training: 8-22 mins $Self Care/Home Management: 8-22  G Codes:      Duncan Dull 04/22/2014, 11:55 AM Alben Deeds, PT DPT  [phone]

## 2014-04-22 NOTE — Progress Notes (Signed)
    Subjective: 2 Days Post-Op Procedure(s) (LRB): RIGHT TOTAL HIP ARTHROPLASTY ANTERIOR APPROACH (Right) CYSTOSCOPY WITH URETHRAL DILATATION AND FOLEY PLACEMENT (N/A) Patient reports pain as 2 on 0-10 scale.   Denies CP or SOB.  Voiding without difficulty. Positive flatus. Objective: Vital signs in last 24 hours: Temp:  [97.8 F (36.6 C)-99.1 F (37.3 C)] 97.8 F (36.6 C) (04/26 0522) Pulse Rate:  [60-70] 60 (04/26 0522) Resp:  [16-18] 18 (04/26 0522) BP: (94-155)/(36-53) 94/47 mmHg (04/26 0522) SpO2:  [95 %-100 %] 100 % (04/26 0522)  Intake/Output from previous day: 04/25 0701 - 04/26 0700 In: 600 [P.O.:600] Out: 1600 [Urine:1600] Intake/Output this shift:    Labs:  Recent Labs  04/21/14 0705 04/22/14 0421  HGB 10.0* 9.5*    Recent Labs  04/21/14 0705 04/22/14 0421  WBC 7.0 5.8  RBC 3.25* 3.07*  HCT 31.5* 29.6*  PLT 176 142*    Recent Labs  04/21/14 0705 04/22/14 0421  NA 140 138  K 5.4* 4.8  CL 103 103  CO2 22 22  BUN 43* 47*  CREATININE 2.38* 2.20*  GLUCOSE 262* 185*  CALCIUM 8.5 8.2*   No results found for this basename: LABPT, INR,  in the last 72 hours  Physical Exam: Neurologically intact ABD soft Intact pulses distally Incision: dressing C/D/I and no drainage Compartment soft  Assessment/Plan: 2 Days Post-Op Procedure(s) (LRB): RIGHT TOTAL HIP ARTHROPLASTY ANTERIOR APPROACH (Right) CYSTOSCOPY WITH URETHRAL DILATATION AND FOLEY PLACEMENT (N/A) Advance diet Up with therapy Discharge home with home health if needed D/C after cleared by PT  Melina Schools for Dr. Melina Schools [facility] [phone] 04/22/2014, 9:38 AM

## 2014-04-24 ENCOUNTER — Encounter (HOSPITAL_COMMUNITY): Payer: Self-pay | Admitting: Orthopedic Surgery

## 2014-04-27 ENCOUNTER — Ambulatory Visit (INDEPENDENT_AMBULATORY_CARE_PROVIDER_SITE_OTHER): Payer: Medicare Other | Admitting: Urology

## 2014-04-27 DIAGNOSIS — Z8546 Personal history of malignant neoplasm of prostate: Secondary | ICD-10-CM

## 2014-04-27 DIAGNOSIS — IMO0002 Reserved for concepts with insufficient information to code with codable children: Secondary | ICD-10-CM

## 2014-05-10 NOTE — Discharge Summary (Signed)
Physician Discharge Summary   Patient ID: Manuel [name] MRN: [id number] DOB/AGE: 1935/05/29 79 y.o.  Admit date: 04/20/2014 Discharge date: 04/22/2014  Primary Diagnosis:  Osteoarthritis of the Right hip.   Admission Diagnoses:  Past Medical History  Diagnosis Date  . Hypertension   . Back pain   . Cancer     PROSTATE -- RADIATION SEED IMPLANT  . Diabetes mellitus     DX SINCE 1998   Discharge Diagnoses:   Principal Problem:   OA (osteoarthritis) of hip Active Problems:   Bladder neck contracture  Estimated body mass index is 33.12 kg/(m^2) as calculated from the following:   Height as of 04/11/14: _0  (1.854 m).   Weight as of this encounter: 113.853 kg (251 lb).  PROCEDURE: Right total hip arthroplasty, anterior approach.   Procedure(s) (LRB): RIGHT TOTAL HIP ARTHROPLASTY ANTERIOR APPROACH (Right) CYSTOSCOPY WITH URETHRAL DILATATION AND FOLEY PLACEMENT (N/A)   Consults: urology, Dr. Jeffie Pollock  HPI: Manuel [name] is a 79 y.o. male who has advanced end-  stage arthritis of his Right hip with progressively worsening pain and  dysfunction.The patient has failed nonoperative management and presents for  total hip arthroplasty.   Laboratory Data: Admission on 04/20/2014, Discharged on 04/22/2014  Component Date Value Ref Range Status  . Glucose-Capillary 04/20/2014 137* 70 - 99 mg/dL Final  . Glucose-Capillary 04/20/2014 213* 70 - 99 mg/dL Final  . WBC 04/21/2014 7.0  4.0 - 10.5 K/uL Final  . RBC 04/21/2014 3.25* 4.22 - 5.81 MIL/uL Final  . Hemoglobin 04/21/2014 10.0* 13.0 - 17.0 g/dL Final  . HCT 04/21/2014 31.5* 39.0 - 52.0 % Final  . MCV 04/21/2014 96.9  78.0 - 100.0 fL Final  . MCH 04/21/2014 30.8  26.0 - 34.0 pg Final  . MCHC 04/21/2014 31.7  30.0 - 36.0 g/dL Final  . RDW 04/21/2014 14.4  11.5 - 15.5 % Final  . Platelets 04/21/2014 176  150 - 400 K/uL Final  . Sodium 04/21/2014 140  137 - 147 mEq/L Final  . Potassium 04/21/2014 5.4* 3.7 - 5.3 mEq/L Final  .  Chloride 04/21/2014 103  96 - 112 mEq/L Final  . CO2 04/21/2014 22  19 - 32 mEq/L Final  . Glucose, Bld 04/21/2014 262* 70 - 99 mg/dL Final  . BUN 04/21/2014 43* 6 - 23 mg/dL Final  . Creatinine, Ser 04/21/2014 2.38* 0.50 - 1.35 mg/dL Final  . Calcium 04/21/2014 8.5  8.4 - 10.5 mg/dL Final  . GFR calc non Af Amer 04/21/2014 25* >90 mL/min Final  . GFR calc Af Amer 04/21/2014 29* >90 mL/min Final   Comment: (NOTE)                          The eGFR has been calculated using the CKD EPI equation.                          This calculation has not been validated in all clinical situations.                          eGFR's persistently <90 mL/min signify possible Chronic Kidney                          Disease.  . Glucose-Capillary 04/20/2014 224* 70 - 99 mg/dL Final  . Glucose-Capillary 04/20/2014 329* 70 - 99 mg/dL Final  .  Glucose-Capillary 04/21/2014 307* 70 - 99 mg/dL Final  . Glucose-Capillary 04/21/2014 240* 70 - 99 mg/dL Final  . Glucose-Capillary 04/21/2014 205* 70 - 99 mg/dL Final  . Glucose-Capillary 04/21/2014 270* 70 - 99 mg/dL Final  . WBC 04/22/2014 5.8  4.0 - 10.5 K/uL Final  . RBC 04/22/2014 3.07* 4.22 - 5.81 MIL/uL Final  . Hemoglobin 04/22/2014 9.5* 13.0 - 17.0 g/dL Final  . HCT 04/22/2014 29.6* 39.0 - 52.0 % Final  . MCV 04/22/2014 96.4  78.0 - 100.0 fL Final  . MCH 04/22/2014 30.9  26.0 - 34.0 pg Final  . MCHC 04/22/2014 32.1  30.0 - 36.0 g/dL Final  . RDW 04/22/2014 14.3  11.5 - 15.5 % Final  . Platelets 04/22/2014 142* 150 - 400 K/uL Final  . Sodium 04/22/2014 138  137 - 147 mEq/L Final  . Potassium 04/22/2014 4.8  3.7 - 5.3 mEq/L Final  . Chloride 04/22/2014 103  96 - 112 mEq/L Final  . CO2 04/22/2014 22  19 - 32 mEq/L Final  . Glucose, Bld 04/22/2014 185* 70 - 99 mg/dL Final  . BUN 04/22/2014 47* 6 - 23 mg/dL Final  . Creatinine, Ser 04/22/2014 2.20* 0.50 - 1.35 mg/dL Final  . Calcium 04/22/2014 8.2* 8.4 - 10.5 mg/dL Final  . GFR calc non Af Amer 04/22/2014 27*  >90 mL/min Final  . GFR calc Af Amer 04/22/2014 31* >90 mL/min Final   Comment: (NOTE)                          The eGFR has been calculated using the CKD EPI equation.                          This calculation has not been validated in all clinical situations.                          eGFR's persistently <90 mL/min signify possible Chronic Kidney                          Disease.  . Glucose-Capillary 04/21/2014 265* 70 - 99 mg/dL Final  . Comment 1 04/21/2014 Documented in Chart   Final  . Comment 2 04/21/2014 Notify RN   Final  . Glucose-Capillary 04/22/2014 139* 70 - 99 mg/dL Final  . Comment 1 04/22/2014 Documented in Chart   Final  . Comment 2 04/22/2014 Notify RN   Final  . Glucose-Capillary 04/22/2014 164* 70 - 99 mg/dL Final  Hospital Outpatient Visit on 04/11/2014  Component Date Value Ref Range Status  . Sodium 04/11/2014 141  137 - 147 mEq/L Final  . Potassium 04/11/2014 5.3  3.7 - 5.3 mEq/L Final  . Chloride 04/11/2014 104  96 - 112 mEq/L Final  . CO2 04/11/2014 23  19 - 32 mEq/L Final  . Glucose, Bld 04/11/2014 102* 70 - 99 mg/dL Final  . BUN 04/11/2014 35* 6 - 23 mg/dL Final  . Creatinine, Ser 04/11/2014 2.08* 0.50 - 1.35 mg/dL Final  . Calcium 04/11/2014 9.1  8.4 - 10.5 mg/dL Final  . GFR calc non Af Amer 04/11/2014 29* >90 mL/min Final  . GFR calc Af Amer 04/11/2014 34* >90 mL/min Final   Comment: (NOTE)  The eGFR has been calculated using the CKD EPI equation.                          This calculation has not been validated in all clinical situations.                          eGFR's persistently <90 mL/min signify possible Chronic Kidney                          Disease.  Hospital Outpatient Visit on 04/10/2014  Component Date Value Ref Range Status  . MRSA, PCR 04/10/2014 NEGATIVE  NEGATIVE Final  . Staphylococcus aureus 04/10/2014 NEGATIVE  NEGATIVE Final   Comment:                                 The Xpert SA Assay (FDA                           approved for NASAL specimens                          in patients over 8 years of age),                          is one component of                          a comprehensive surveillance                          program.  Test performance has                          been validated by [employer] for patients greater                          than or equal to 15 year old.                          It is not intended                          to diagnose infection nor to                          guide or monitor treatment.  Marland Kitchen aPTT 04/10/2014 25  24 - 37 seconds Final  . WBC 04/10/2014 5.0  4.0 - 10.5 K/uL Final  . RBC 04/10/2014 4.02* 4.22 - 5.81 MIL/uL Final  . Hemoglobin 04/10/2014 12.6* 13.0 - 17.0 g/dL Final  . HCT 04/10/2014 39.0  39.0 - 52.0 % Final  . MCV 04/10/2014 97.0  78.0 - 100.0 fL Final  . MCH 04/10/2014 31.3  26.0 - 34.0 pg Final  . MCHC 04/10/2014 32.3  30.0 - 36.0 g/dL Final  . RDW 04/10/2014 14.2  11.5 - 15.5 % Final  .  Platelets 04/10/2014 201  150 - 400 K/uL Final  . Sodium 04/10/2014 144  137 - 147 mEq/L Final  . Potassium 04/10/2014 6.3* 3.7 - 5.3 mEq/L Final  . Chloride 04/10/2014 106  96 - 112 mEq/L Final  . CO2 04/10/2014 26  19 - 32 mEq/L Final  . Glucose, Bld 04/10/2014 104* 70 - 99 mg/dL Final  . BUN 04/10/2014 36* 6 - 23 mg/dL Final  . Creatinine, Ser 04/10/2014 2.25* 0.50 - 1.35 mg/dL Final  . Calcium 04/10/2014 9.4  8.4 - 10.5 mg/dL Final  . Total Protein 04/10/2014 7.2  6.0 - 8.3 g/dL Final  . Albumin 04/10/2014 4.0  3.5 - 5.2 g/dL Final  . AST 04/10/2014 18  0 - 37 U/L Final  . ALT 04/10/2014 13  0 - 53 U/L Final  . Alkaline Phosphatase 04/10/2014 39  39 - 117 U/L Final  . Total Bilirubin 04/10/2014 0.3  0.3 - 1.2 mg/dL Final  . GFR calc non Af Amer 04/10/2014 26* >90 mL/min Final  . GFR calc Af Amer 04/10/2014 31* >90 mL/min Final   Comment: (NOTE)                          The eGFR has been calculated using the CKD  EPI equation.                          This calculation has not been validated in all clinical situations.                          eGFR's persistently <90 mL/min signify possible Chronic Kidney                          Disease.  Marland Kitchen Prothrombin Time 04/10/2014 12.6  11.6 - 15.2 seconds Final  . INR 04/10/2014 0.96  0.00 - 1.49 Final  . ABO/RH(D) 04/10/2014 O POS   Final  . Antibody Screen 04/10/2014 NEG   Final  . Sample Expiration 04/10/2014 04/24/2014   Final  . Color, Urine 04/10/2014 YELLOW  YELLOW Final  . APPearance 04/10/2014 CLEAR  CLEAR Final  . Specific Gravity, Urine 04/10/2014 1.017  1.005 - 1.030 Final  . pH 04/10/2014 6.0  5.0 - 8.0 Final  . Glucose, UA 04/10/2014 NEGATIVE  NEGATIVE mg/dL Final  . Hgb urine dipstick 04/10/2014 NEGATIVE  NEGATIVE Final  . Bilirubin Urine 04/10/2014 NEGATIVE  NEGATIVE Final  . Ketones, ur 04/10/2014 NEGATIVE  NEGATIVE mg/dL Final  . Protein, ur 04/10/2014 NEGATIVE  NEGATIVE mg/dL Final  . Urobilinogen, UA 04/10/2014 1.0  0.0 - 1.0 mg/dL Final  . Nitrite 04/10/2014 NEGATIVE  NEGATIVE Final  . Leukocytes, UA 04/10/2014 NEGATIVE  NEGATIVE Final   MICROSCOPIC NOT DONE ON URINES WITH NEGATIVE PROTEIN, BLOOD, LEUKOCYTES, NITRITE, OR GLUCOSE <1000 mg/dL.  . ABO/RH(D) 04/10/2014 O POS   Final     X-Rays:Dg Chest 2 View  04/10/2014   CLINICAL DATA:  Preop for hip replacement  EXAM: CHEST  2 VIEW  COMPARISON:  02/25/2006  FINDINGS: Cardiomediastinal silhouette is stable. No acute infiltrate or pleural effusion. No pulmonary edema. Mild degenerative changes thoracic spine.  IMPRESSION: No active cardiopulmonary disease.   Electronically Signed   By: Lahoma Crocker M.D.   On: 04/10/2014 12:47   Dg Hip Complete Right  04/10/2014   CLINICAL DATA:  Preop for right  hip replacement.  EXAM: RIGHT HIP - COMPLETE 2+ VIEW  COMPARISON:  06/16/2012  FINDINGS: No fracture or dislocation.  There are marked degenerative changes of both hips with severe superior lateral hip  joint space narrowing, mild subchondral sclerosis and prominent osteophytes from the bases of the femoral heads and superior lateral margins of the acetabula. Hip joint space narrowing has increased on the right from the prior study.  No osteoblastic or osteolytic lesions. There are multiple radiation therapy seeds centered on the prostate.  SI joints and symphysis pubis are normally aligned. Bones are demineralized. There are degenerative changes of the visualized lower lumbar spine.  IMPRESSION: 1. Advanced degenerative changes of both hips. No fracture, bone lesion or acute finding.   Electronically Signed   By: Lajean Manes M.D.   On: 04/10/2014 14:06   Dg Hip Operative Right  04/20/2014   CLINICAL DATA:  Right hip replacement.  EXAM: DG OPERATIVE RIGHT HIP  TECHNIQUE: A single spot fluoroscopic AP image of the right hip is submitted.  COMPARISON:  Single view of the pelvis 04/10/2014.  FINDINGS: We are provided with 2 fluoroscopic intraoperative spot views of the right hip. Images demonstrate severe right hip osteoarthritis. Radiotherapy seeds for prostate cancer are noted.  IMPRESSION: Right hip replacement progress.   Electronically Signed   By: Inge Rise M.D.   On: 04/20/2014 13:44   Dg Pelvis Portable  04/20/2014   CLINICAL DATA:  79 year old male status post right hip surgery. Initial encounter.  EXAM: PORTABLE PELVIS 1-2 VIEWS  COMPARISON:  Intraoperative images from 10/20 9 hr the same day.  FINDINGS: Portable AP view at 1258 hrs. New right bipolar hip arthroplasty in place. Hardware appears intact. Normal alignment on this single view.  Chronic postoperative/post brachy therapy changes to the region of the prostate and scrotum. Bladder catheter in place, small volume of gas within the bladder. Advanced degenerative changes also noted at the left hip.  IMPRESSION: Right hip arthroplasty with no adverse features.   Electronically Signed   By: Lars Pinks M.D.   On: 04/20/2014 13:08     EKG: Orders placed during the hospital encounter of 04/10/14  . EKG 12-LEAD  . EKG 12-LEAD     Hospital Course: Patient was admitted to [facility] and taken to the OR and underwent the above state procedure without complications except a foley catheter was unable to be placed requiring a urology consult to e called.   He underwent a Cystoscopy with urethral dilation and complicated Foley  catheter placement per Dr. Jeffie Pollock. Patient tolerated the procedures well and was later transferred to the recovery room and then to the orthopaedic floor for postoperative care.  They were given PO and IV analgesics for pain control following their surgery.  They were given 24 hours of postoperative antibiotics of  Anti-infectives   Start     Dose/Rate Route Frequency Ordered Stop   04/20/14 1800  ceFAZolin (ANCEF) IVPB 2 g/50 mL premix     2 g 100 mL/hr over 30 Minutes Intravenous Every 6 hours 04/20/14 1711 04/21/14 0123   04/20/14 0600  ceFAZolin (ANCEF) IVPB 2 g/50 mL premix     2 g 100 mL/hr over 30 Minutes Intravenous On call to O.R. 04/19/14 1409 04/20/14 0957     and started on DVT prophylaxis in the form of Lovenox.   PT and OT were ordered for total hip protocol.  The patient was allowed to be WBAT with therapy. Discharge planning was  consulted to help with postop disposition and equipment needs.  Patient had a decent night on the evening of surgery.   Urology Consult Assessment:  s/p Procedure(s):  RIGHT TOTAL HIP ARTHROPLASTY ANTERIOR APPROACH  CYSTOSCOPY WITH URETHRAL DILATATION AND FOLEY PLACEMENT  He has a prostatic urethra stricture with prostatic stones and had a traumatic foley insertion with a bulbar false passage.  The foley is draining well and the urine is clear.  Plan:  I would like for him to keep the foley for a week to allow the urethra to heal.  I will set him up to come to my office in [place] on Friday to have the catheter removed. If it would be prudent for  him to be on antibiotic coverage while the foley is in place, please choose an appropriate agent for his recent hip surgery.   They started to get up OOB with therapy on day one.  Hemovac drain was pulled without difficulty.  Continued to work with therapy into day two.  Dressing was changed on day two and the incision was healing well.   Patient was seen in rounds and was ready to go home late that day and left with the foley in place.  Diet: Cardiac diet and Diabetic diet Activity:WBAT Follow-up:in 2 weeks Disposition - Home Discharged Condition: good      Medication List    STOP taking these medications       HYDROcodone-acetaminophen 5-325 MG per tablet  Commonly known as:  NORCO/VICODIN      TAKE these medications       amLODipine 5 MG tablet  Commonly known as:  NORVASC  Take 5 mg by mouth daily.     aspirin EC 81 MG tablet  Take 81 mg by mouth daily.     enalapril 10 MG tablet  Commonly known as:  VASOTEC  Take 10 mg by mouth 2 (two) times daily.     enoxaparin 40 MG/0.4ML injection  Commonly known as:  LOVENOX  Inject 0.4 mLs (40 mg total) into the skin daily.     fenofibrate 160 MG tablet  Take 160 mg by mouth daily.     gabapentin 600 MG tablet  Commonly known as:  NEURONTIN  Take 600 mg by mouth 6 (six) times daily. 1 tab six times a day     glimepiride 2 MG tablet  Commonly known as:  AMARYL  Take 2 mg by mouth daily before breakfast.     methocarbamol 500 MG tablet  Commonly known as:  ROBAXIN  Take 1 tablet (500 mg total) by mouth every 6 (six) hours as needed for muscle spasms.     ONE TOUCH ULTRA TEST test strip  Generic drug:  glucose blood     oxyCODONE 5 MG immediate release tablet  Commonly known as:  Oxy IR/ROXICODONE  Take 1-2 tablets (5-10 mg total) by mouth every 3 (three) hours as needed for breakthrough pain.     promethazine 25 MG tablet  Commonly known as:  PHENERGAN  Take 1 tablet (25 mg total) by mouth every 6 (six) hours as  needed for nausea or vomiting.     rivastigmine 3 MG capsule  Commonly known as:  EXELON  Take 3 mg by mouth 2 (two) times daily.     traMADol 50 MG tablet  Commonly known as:  ULTRAM  Take 1-2 tablets (50-100 mg total) by mouth every 6 (six) hours as needed for moderate pain.  Follow-up Information   Follow up with [facility]. (Someone from [facility] will contact you concerning start date and time for Physical therapy.)    Contact information:   [address] [place] [address] [phone]       Follow up with Malka So, MD. (Please contact my office to set an appt in our [place] office for 5/1 to have your catheter removed. )    Specialty:  Urology   Contact information:   [facility]  [place] [address] [phone]       Follow up with Gearlean Alf, MD. Schedule an appointment as soon as possible for a visit on 05/01/2014. (Call [phone] tomorrow tto make the appointment)    Specialty:  Orthopedic Surgery   Contact information:   [address] [place] [address] [place] [address] [phone]       Signed: Arlee Muslim, PA-C Orthopaedic Surgery 05/10/2014, 12:10 PM

## 2014-06-15 ENCOUNTER — Ambulatory Visit (INDEPENDENT_AMBULATORY_CARE_PROVIDER_SITE_OTHER): Payer: Medicare Other | Admitting: Urology

## 2014-06-15 DIAGNOSIS — IMO0002 Reserved for concepts with insufficient information to code with codable children: Secondary | ICD-10-CM

## 2014-06-15 DIAGNOSIS — Z8546 Personal history of malignant neoplasm of prostate: Secondary | ICD-10-CM

## 2014-06-15 DIAGNOSIS — N393 Stress incontinence (female) (male): Secondary | ICD-10-CM

## 2014-07-27 ENCOUNTER — Ambulatory Visit (INDEPENDENT_AMBULATORY_CARE_PROVIDER_SITE_OTHER): Payer: Medicare Other | Admitting: Urology

## 2014-07-27 DIAGNOSIS — Z8546 Personal history of malignant neoplasm of prostate: Secondary | ICD-10-CM

## 2014-07-27 DIAGNOSIS — IMO0002 Reserved for concepts with insufficient information to code with codable children: Secondary | ICD-10-CM

## 2014-07-27 DIAGNOSIS — N393 Stress incontinence (female) (male): Secondary | ICD-10-CM

## 2014-11-08 ENCOUNTER — Ambulatory Visit (HOSPITAL_COMMUNITY): Payer: Medicare Other | Admitting: Physical Therapy

## 2014-11-12 ENCOUNTER — Encounter (HOSPITAL_COMMUNITY): Payer: Medicare Other | Admitting: Physical Therapy

## 2014-11-14 ENCOUNTER — Encounter (HOSPITAL_COMMUNITY): Payer: Medicare Other

## 2014-11-20 ENCOUNTER — Ambulatory Visit (HOSPITAL_COMMUNITY)
Admission: RE | Admit: 2014-11-20 | Discharge: 2014-11-20 | Disposition: A | Discharge status: Home / Self Care | Payer: Medicare Other | Source: Ambulatory Visit | Attending: Physician Assistant | Admitting: Physician Assistant

## 2014-11-20 DIAGNOSIS — Z5189 Encounter for other specified aftercare: Secondary | ICD-10-CM | POA: Diagnosis not present

## 2014-11-20 DIAGNOSIS — R2689 Other abnormalities of gait and mobility: Secondary | ICD-10-CM

## 2014-11-20 DIAGNOSIS — R262 Difficulty in walking, not elsewhere classified: Secondary | ICD-10-CM | POA: Insufficient documentation

## 2014-11-20 DIAGNOSIS — M6281 Muscle weakness (generalized): Secondary | ICD-10-CM | POA: Insufficient documentation

## 2014-11-20 DIAGNOSIS — R29898 Other symptoms and signs involving the musculoskeletal system: Secondary | ICD-10-CM

## 2014-11-20 NOTE — Therapy (Signed)
Physical Therapy Evaluation  Patient Details  Name: Manuel [name] MRN: [id number] Date of Birth: Mar 19, 1935  Encounter Date: 11/20/2014      PT End of Session - 11/20/14 1155    Visit Number 1   Number of Visits 8   Date for PT Re-Evaluation 12/20/14   Authorization Type [place] Time 1110   PT Stop Time 1155   PT Time Calculation (min) 45 min   Activity Tolerance Patient tolerated treatment well      Past Medical History  Diagnosis Date  . Hypertension   . Back pain   . Cancer     PROSTATE -- RADIATION SEED IMPLANT  . Diabetes mellitus     DX SINCE 1998    Past Surgical History  Procedure Laterality Date  . Back surgery    . Prostate surgery      with seed implants  . Cholecystectomy    . Vasectomy    . Eye surgery      CATARACT BOTH EYES 2014  . Transurethral resection of prostate  2012  . Total hip arthroplasty Right 04/20/2014    Procedure: RIGHT TOTAL HIP ARTHROPLASTY ANTERIOR APPROACH;  Surgeon: Gearlean Alf, MD;  Location: [facility];  Service: Orthopedics;  Laterality: Right;  . Cystoscopy with urethral dilatation N/A 04/20/2014    Procedure: CYSTOSCOPY WITH URETHRAL DILATATION AND FOLEY PLACEMENT;  Surgeon: Gearlean Alf, MD;  Location: [facility];  Service: Orthopedics;  Laterality: N/A;    There were no vitals taken for this visit.  Visit Diagnosis:  Leg weakness, bilateral  Poor balance      Subjective Assessment - 11/20/14 1120    Symptoms Manuel [name] had a Rt THR on 04/20/2014 and was released to [facility] on 04/22/2014.  He recieved HH  for six weeks.  He was not referred to OP but at this time he feels he nees outpatient due to feeling weak in his legs and lower back.   He is having difficulty with his balance as well.     Pertinent History Rt hip replacement 03/2014; back surgery 2013, 2014  Fusion lumbar spine in February 2015 recieved out paitient for 4 weeks.    How long can you sit comfortably? no problem    How long can you stand comfortably?  able to stand for 15 minutes    How long can you walk comfortably? Pt is walking without an assistive device very slowly;    Patient Stated Goals to get my legs stronger    Currently in Pain? Yes   Pain Score 5    Pain Location Back   Pain Orientation Right   Pain Descriptors / Indicators Aching   Pain Type Chronic pain   Pain Radiating Towards Rt cheek    Pain Onset More than a month ago   Pain Frequency Intermittent   Aggravating Factors  activity    Pain Relieving Factors rest          [facility] PT Assessment - 11/20/14 1132    Assessment   Medical Diagnosis Rt hip replacement   Onset Date 04/20/14   Next MD Visit 03/2015   Prior Therapy for back   Precautions   Precautions Anterior Hip   Precaution Booklet Issued Yes (comment)   Restrictions   Weight Bearing Restrictions Yes   Balance Screen   Has the patient fallen in the past 6 months Yes   How many times? 1  Pt states he loses his  balance multiple times but no falls    Has the patient had a decrease in activity level because of a fear of falling?  Yes   Is the patient reluctant to leave their home because of a fear of falling?  No   Home Environment   Living Enviornment Private residence   Prior Function   Level of Independence Independent with basic ADLs   Vocation Retired   Leisure none   Cognition   Overall Cognitive Status Within Functional Limits for tasks assessed   Observation/Other Assessments   Observations slow with ambulation    Focus on Therapeutic Outcomes (FOTO)  67   Strength   Right Hip Flexion 3+/5   Right Hip Extension 2+/5   Right Hip ABduction 3-/5   Left Hip Flexion 3+/5   Left Hip Extension 3+/5   Left Hip ABduction 5/5   Right Knee Extension --  4-/5   Left Knee Extension 5/5   Right Ankle Dorsiflexion 3+/5   Left Ankle Dorsiflexion 3+/5   Static Standing Balance   Static Standing - Balance Support No upper extremity supported   Static Standing Balance -  Activities  Single Leg  Stance - Right Leg;Single Leg Stance - Left Leg   Static Standing - Comment/# of Minutes --  Rt 6 seconds Lt 11               PT Short Term Goals - 11/20/14 1208    PT SHORT TERM GOAL #1   Title Pt to be able to complete 7 sit to stand in 30 seconds to demonstrate improved power( norm for pt age is 11-17)   Time 2   Period Weeks   PT SHORT TERM GOAL #2   Title Pt to be walking for ten minutes at least 3 times a week    Time 2   Period Weeks   PT SHORT TERM GOAL #3   Title Pt to be I with HEP   Time 2   Period Weeks          PT Long Term Goals - 11/20/14 1211    PT LONG TERM GOAL #1   Title Pt strerngth to be improved one grade to allow pt to walk for 15 minutes without stopping   Time 4   Period Weeks   PT LONG TERM GOAL #2   Title Pt to be able to complete 11 sit to stand in 30 seconds for improved power   Time 4   Period Weeks   PT LONG TERM GOAL #3   Title Pt SLS to be 15 sec B  to reduce risk of falling    Time 4   Period Weeks          Plan - 11/20/14 1201    Clinical Impression Statement Manuel [name] is a 79 yo male who has had a difficult year.  In February he had a back fusion, in April he had a THR on his right side and in October he lost his wife.  He did not recieve outpatient therapy following his hip surgery because he appeared to be doing well.  He has not, however, recovered his strength and he does not feel he is able to do shopping, housecleaning and other activities that he had been performing at the same level therefore he has been referred to  and will benefit from skilled PT to maximize his functional ability.  Examination demonstrates decresed LE strength, decreased core strength and decreased balance.  Pt will benefit from skilled therapeutic intervention in order to improve on the following deficits Decreased activity tolerance;Decreased balance;Decreased strength;Difficulty walking;Pain   Rehab Potential Good   PT Frequency 2x / week    PT Duration 4 weeks   PT Treatment/Interventions Gait training;Therapeutic activities;Therapeutic exercise;Balance training;Patient/family education   PT Next Visit Plan begin rocker board, SLS, lateral and forward step up, sit to stand, hamstring and gastroc stretching.    PT Home Exercise Plan given   Consulted and Agree with Plan of Care Patient          G-Codes - 12-10-14 1215    Functional Assessment Tool Used foto    Functional Limitation Mobility: Walking and moving around   Mobility: Walking and Moving Around Current Status [phone]) At least 20 percent but less than 40 percent impaired, limited or restricted   Mobility: Walking and Moving Around Goal Status [phone]) At least 20 percent but less than 40 percent impaired, limited or restricted      Problem List Patient Active Problem List   Diagnosis Date Noted  . OA (osteoarthritis) of hip 04/20/2014  . Bladder neck contracture 04/20/2014  . Lumbago 07/05/2012  . Spondylosis with myelopathy, lumbar region 07/05/2012  . Degenerative arthritis of hip 09/22/2011  . Degenerative disc disease, lumbar 09/22/2011  . Acute on chronic renal failure 08/07/2011  . BPH (benign prostatic hyperplasia) 08/07/2011  . Prostate cancer 08/07/2011  . Hypertension 08/07/2011  . Diabetes mellitus type 2 with complications 17/35/6701  . Lumbar disc disease 08/07/2011        Azucena Freed PT/CLT [phone]    Dec 10, 2014, 12:16 PM

## 2014-11-20 NOTE — Patient Instructions (Signed)
Heel Raise (Sitting)   Raise heels, keeping toes on floor. Repeat ____ times per set. Do ____ sets per session. Do ____ sessions per day.  http://orth.exer.us/44   Copyright  VHI. All rights reserved.  Toe Raise (Sitting)   Raise toes, keeping heels on floor. Repeat ____ times per set. Do ____ sets per session. Do ____ sessions per day.  http://orth.exer.us/46   Copyright  VHI. All rights reserved.  Knee Extension (Sitting)   Place ____ pound weight on left ankle and straighten knee fully, lower slowly. Repeat ____ times per set. Do ____ sets per session. Do ____ sessions per day.  http://orth.exer.us/732   Copyright  VHI. All rights reserved.  Strengthening: Hip Abduction (Side-Lying)   Tighten muscles on front of left thigh, then lift leg ____ inches from surface, keeping knee locked.  Repeat ____ times per set. Do ____ sets per session. Do ____ sessions per day.  http://orth.exer.us/622   Copyright  VHI. All rights reserved.  Bridging   Slowly raise buttocks from floor, keeping stomach tight. Repeat ____ times per set. Do ____ sets per session. Do ____ sessions per day.  http://orth.exer.us/1096   Copyright  VHI. All rights reserved.

## 2014-11-21 ENCOUNTER — Ambulatory Visit (HOSPITAL_COMMUNITY)
Admission: RE | Admit: 2014-11-21 | Discharge: 2014-11-21 | Disposition: A | Discharge status: Home / Self Care | Payer: Medicare Other | Source: Ambulatory Visit | Attending: Internal Medicine | Admitting: Internal Medicine

## 2014-11-21 DIAGNOSIS — Z5189 Encounter for other specified aftercare: Secondary | ICD-10-CM | POA: Diagnosis not present

## 2014-11-21 DIAGNOSIS — R29898 Other symptoms and signs involving the musculoskeletal system: Secondary | ICD-10-CM

## 2014-11-21 DIAGNOSIS — R2689 Other abnormalities of gait and mobility: Secondary | ICD-10-CM

## 2014-11-21 NOTE — Therapy (Signed)
Physical Therapy Treatment  Patient Details  Name: Manuel [name] MRN: [id number] Date of Birth: 15-Nov-1935  Encounter Date: 11/21/2014      PT End of Session - 11/21/14 1824    Visit Number 2   Number of Visits 8   Date for PT Re-Evaluation 12/20/14   PT Start Time 1607   PT Stop Time 1809   PT Time Calculation (min) 39 min   Activity Tolerance Patient tolerated treatment well      Past Medical History  Diagnosis Date  . Hypertension   . Back pain   . Cancer     PROSTATE -- RADIATION SEED IMPLANT  . Diabetes mellitus     DX SINCE 1998    Past Surgical History  Procedure Laterality Date  . Back surgery    . Prostate surgery      with seed implants  . Cholecystectomy    . Vasectomy    . Eye surgery      CATARACT BOTH EYES 2014  . Transurethral resection of prostate  2012  . Total hip arthroplasty Right 04/20/2014    Procedure: RIGHT TOTAL HIP ARTHROPLASTY ANTERIOR APPROACH;  Surgeon: Gearlean Alf, MD;  Location: [place];  Service: Orthopedics;  Laterality: Right;  . Cystoscopy with urethral dilatation N/A 04/20/2014    Procedure: CYSTOSCOPY WITH URETHRAL DILATATION AND FOLEY PLACEMENT;  Surgeon: Gearlean Alf, MD;  Location: [place];  Service: Orthopedics;  Laterality: N/A;    There were no vitals taken for this visit.  Visit Diagnosis:  Leg weakness, bilateral  Poor balance      Subjective Assessment - 11/21/14 1737    Symptoms No complaints of pain in the Rt hip today.  Pt had a fall to the floor last night when trying to sit in his chair, no dizziness only reports he lost his balance.  No complaints of pain after fall, and pt was able to return to standing by pushing up on chair.    Currently in Pain? No/denies          [facility] PT Assessment - 11/21/14 1736    Assessment   Medical Diagnosis Rt hip replacement   Onset Date 04/20/14   Next MD Visit 03/2015   Flexibility   Soft Tissue Assessment /Muscle Lenght yes   Quadriceps Rt 6 inch, Lt 7 1/2 inch to  glute          [facility] Adult PT Treatment/Exercise - 11/21/14 1735    Exercises   Exercises Knee/Hip   Knee/Hip Exercises: Stretches   Active Hamstring Stretch 3 reps;30 seconds   Active Hamstring Stretch Limitations 14" Box   Quad Stretch 3 reps;30 seconds   Quad Stretch Limitations prone with rope   Gastroc Stretch 3 reps;30 seconds   Gastroc Stretch Limitations Slantboard   Knee/Hip Exercises: Standing   Heel Raises 2 sets;10 reps  No UE assist   Heel Raises Limitations Toe Raise 2x10, 1 HHA   Lateral Step Up 10 reps;Hand Hold: 1;Step Height: 4"   Forward Step Up 10 reps;Hand Hold: 1;Step Height: 4"   Knee/Hip Exercises: Seated   Other Seated Knee Exercises Sit <-> Stand 2x5 from 21" table, no UE   Knee/Hip Exercises: Supine   Straight Leg Raises 10 reps;Right   Knee/Hip Exercises: Sidelying   Hip ABduction 10 reps;Right   Knee/Hip Exercises: Prone   Hip Extension 10 reps;Right          PT Education - 11/21/14 1823    Education  provided Yes   Education Details HEP : hamstring/quad stretch, SLR x3   Person(s) Educated Patient   Methods Explanation;Demonstration;Handout   Comprehension Verbalized understanding;Returned demonstration          PT Short Term Goals - 11/21/14 1830    PT SHORT TERM GOAL #1   Title Pt to be able to complete 7 sit to stand in 30 seconds to demonstrate improved power( norm for pt age is 33-17)   Status On-going   PT SHORT TERM GOAL #2   Title Pt to be walking for ten minutes at least 3 times a week    Status On-going   PT SHORT TERM GOAL #3   Title Pt to be I with HEP   Status On-going          PT Long Term Goals - 2014/12/14 1211    PT LONG TERM GOAL #1   Title Pt strerngth to be improved one grade to allow pt to walk for 15 minutes without stopping   Time 4   Period Weeks   PT LONG TERM GOAL #2   Title Pt to be able to complete 11 sit to stand in 30 seconds for improved power   Time 4   Period Weeks   PT LONG TERM GOAL #3    Title Pt SLS to be 15 sec B  to reduce risk of falling    Time 4   Period Weeks          Plan - 11/21/14 1825    Clinical Impression Statement Initiated PT POC, creating HEP to complete at home.  Added sit to stands without use of UE, with notable quad shaking secondary to weakness/decreased eccentric control with transferring stand -> sit.  Pt did require ht of table increased to 21" to complete exercise without use of UE.  Noted tightness of (B) quads with measurements today and recommended to pt to complete stretching program  on both LE.     Pt will benefit from skilled therapeutic intervention in order to improve on the following deficits Decreased activity tolerance;Decreased balance;Decreased strength;Difficulty walking;Pain   PT Treatment/Interventions Gait training;Therapeutic activities;Therapeutic exercise;Balance training;Patient/family education   PT Next Visit Plan begin rocker board and SLS, and progression of reps of exercises as able.            G-Codes - December 14, 2014 1215    Functional Assessment Tool Used foto    Functional Limitation Mobility: Walking and moving around   Mobility: Walking and Moving Around Current Status [phone]) At least 20 percent but less than 40 percent impaired, limited or restricted   Mobility: Walking and Moving Around Goal Status [phone]) At least 20 percent but less than 40 percent impaired, limited or restricted      Problem List Patient Active Problem List   Diagnosis Date Noted  . OA (osteoarthritis) of hip 04/20/2014  . Bladder neck contracture 04/20/2014  . Lumbago 07/05/2012  . Spondylosis with myelopathy, lumbar region 07/05/2012  . Degenerative arthritis of hip 09/22/2011  . Degenerative disc disease, lumbar 09/22/2011  . Acute on chronic renal failure 08/07/2011  . BPH (benign prostatic hyperplasia) 08/07/2011  . Prostate cancer 08/07/2011  . Hypertension 08/07/2011  . Diabetes mellitus type 2 with complications 27/51/7001  .  Lumbar disc disease 08/07/2011    Lonna Cobb, DPT [phone]

## 2014-11-21 NOTE — Patient Instructions (Signed)
Hamstring Stretch (Standing)   HOLD ONTO COUNTERTOP FOR BALANCE.  Standing, place one heel on chair or bench. Use one or both hands on thigh for support. Keeping torso straight, lean forward slowly until a stretch is felt in back of same thigh.  Hold __30__ seconds. Repeat 3 times on each leg, 1 times per day.    KNEE: Quadriceps - Prone   Place strap around ankle. Bring ankle toward buttocks. Press hip into surface. Hold _30_ seconds. _3__ reps per set, __1_ sets per day, _2__ days per week   Extension   Lift leg up in the air and bring it back down. Repeat with other leg.  Repeat __10-15__ times. Do _1-2___ sets per session, 4 times per week.   Abduction   Lift leg up toward ceiling. Return. Repeat _10-15___ times each leg. Do __1-2__ sets per session.  Repeat 4 times per week.    Straight Leg Raise   Tighten stomach and slowly raise locked right leg __10__ inches from floor.  Repeat _10-15__ times per set. Do __1-2__ sets per session. Repeat 4 times per week.

## 2014-11-28 ENCOUNTER — Ambulatory Visit (HOSPITAL_COMMUNITY)
Admission: RE | Admit: 2014-11-28 | Discharge: 2014-11-28 | Disposition: A | Discharge status: Home / Self Care | Payer: Medicare Other | Source: Ambulatory Visit | Attending: Internal Medicine | Admitting: Internal Medicine

## 2014-11-28 DIAGNOSIS — R262 Difficulty in walking, not elsewhere classified: Secondary | ICD-10-CM | POA: Insufficient documentation

## 2014-11-28 DIAGNOSIS — R29898 Other symptoms and signs involving the musculoskeletal system: Secondary | ICD-10-CM

## 2014-11-28 DIAGNOSIS — Z5189 Encounter for other specified aftercare: Secondary | ICD-10-CM | POA: Insufficient documentation

## 2014-11-28 DIAGNOSIS — M6281 Muscle weakness (generalized): Secondary | ICD-10-CM | POA: Diagnosis not present

## 2014-11-28 DIAGNOSIS — R2689 Other abnormalities of gait and mobility: Secondary | ICD-10-CM

## 2014-11-28 NOTE — Therapy (Signed)
[facility] [address] [place], [place], [address] Phone: [phone]   Fax:  [phone]  Physical Therapy Treatment  Patient Details  Name: Manuel [name] MRN: [id number] Date of Birth: [birth year]  Encounter Date: 11/28/2014      PT End of Session - 11/28/14 1513    Visit Number 3   Number of Visits 8   Date for PT Re-Evaluation 12/20/14   Authorization Type UHC M   PT Start Time 1430   PT Stop Time 2637   PT Time Calculation (min) 44 min      Past Medical History  Diagnosis Date  . Hypertension   . Back pain   . Cancer     PROSTATE -- RADIATION SEED IMPLANT  . Diabetes mellitus     DX SINCE 1998    Past Surgical History  Procedure Laterality Date  . Back surgery    . Prostate surgery      with seed implants  . Cholecystectomy    . Vasectomy    . Eye surgery      CATARACT BOTH EYES 2014  . Transurethral resection of prostate  2012  . Total hip arthroplasty Right 04/20/2014    Procedure: RIGHT TOTAL HIP ARTHROPLASTY ANTERIOR APPROACH;  Surgeon: Gearlean Alf, MD;  Location: [place];  Service: Orthopedics;  Laterality: Right;  . Cystoscopy with urethral dilatation N/A 04/20/2014    Procedure: CYSTOSCOPY WITH URETHRAL DILATATION AND FOLEY PLACEMENT;  Surgeon: Gearlean Alf, MD;  Location: [place];  Service: Orthopedics;  Laterality: N/A;    There were no vitals taken for this visit.  Visit Diagnosis:  Leg weakness, bilateral  Poor balance      Subjective Assessment - 11/28/14 1500    Symptoms Pt states he is not doing his exercises.    Currently in Pain? No/denies            [facility] Adult PT Treatment/Exercise - 11/28/14 1501    Exercises   Exercises Knee/Hip   Knee/Hip Exercises: Stretches   Active Hamstring Stretch 3 reps;30 seconds   Active Hamstring Stretch Limitations 14" Box   Quad Stretch 3 reps;30 seconds   Quad Stretch Limitations prone with rope   Gastroc Stretch 3 reps;30 seconds   Gastroc Stretch  Limitations Slantboard   Knee/Hip Exercises: Standing   Heel Raises 10 reps  No UE assist   Heel Raises Limitations with squats    Forward Lunges Both;10 reps   Lateral Step Up 10 reps;Hand Hold: 1;Step Height: 4"   Forward Step Up 10 reps;Hand Hold: 1;Step Height: 4"   Functional Squat 10 reps   Rocker Board 2 minutes   Knee/Hip Exercises: Seated   Other Seated Knee Exercises Sit <-> Stand 2x5 from 21" table, no UE   Knee/Hip Exercises: Supine   Straight Leg Raises --   Knee/Hip Exercises: Sidelying   Hip ABduction --   Knee/Hip Exercises: Prone   Hip Extension Right;Both;10 reps            PT Short Term Goals - 11/28/14 1515    PT SHORT TERM GOAL #1   Title Pt to be able to complete 7 sit to stand in 30 seconds to demonstrate improved power( norm for pt age is 11-17)   Time 2   Status On-going   PT SHORT TERM GOAL #2   Title Pt to be walking for ten minutes at least 3 times a week    Time 2   Status  On-going   PT SHORT TERM GOAL #3   Title Pt to be I with HEP   Time 2   Status On-going            Plan - 11/28/14 1513    Clinical Impression Statement Added lunges, rocker board and SLS to treatment program. Pt will need to focus on balance as well as strengthening.  All exercises facilitated by therapist.    Pt will benefit from skilled therapeutic intervention in order to improve on the following deficits Decreased activity tolerance;Decreased balance;Decreased strength;Difficulty walking;Pain   PT Frequency 2x / week   PT Duration 4 weeks   PT Treatment/Interventions Gait training;Therapeutic activities;Therapeutic exercise;Balance training;Patient/family education   PT Next Visit Plan begin side stepping with t-band             Problem List Patient Active Problem List   Diagnosis Date Noted  . OA (osteoarthritis) of hip 04/20/2014  . Bladder neck contracture 04/20/2014  . Lumbago 07/05/2012  . Spondylosis with myelopathy, lumbar region 07/05/2012   . Degenerative arthritis of hip 09/22/2011  . Degenerative disc disease, lumbar 09/22/2011  . Acute on chronic renal failure 08/07/2011  . BPH (benign prostatic hyperplasia) 08/07/2011  . Prostate cancer 08/07/2011  . Hypertension 08/07/2011  . Diabetes mellitus type 2 with complications 29/92/4268  . Lumbar disc disease 08/07/2011  11/28/2014, 3:16 PM    Azucena Freed PT/CLT [phone] 11/28/2014

## 2014-11-29 ENCOUNTER — Ambulatory Visit (HOSPITAL_COMMUNITY): Payer: Medicare Other | Admitting: Physical Therapy

## 2014-11-30 ENCOUNTER — Other Ambulatory Visit (HOSPITAL_COMMUNITY): Payer: Self-pay | Admitting: Respiratory Therapy

## 2014-11-30 DIAGNOSIS — G4730 Sleep apnea, unspecified: Secondary | ICD-10-CM

## 2014-12-03 ENCOUNTER — Ambulatory Visit (HOSPITAL_COMMUNITY)
Admission: RE | Admit: 2014-12-03 | Discharge: 2014-12-03 | Disposition: A | Discharge status: Home / Self Care | Payer: Medicare Other | Source: Ambulatory Visit | Attending: Internal Medicine | Admitting: Internal Medicine

## 2014-12-03 DIAGNOSIS — R29898 Other symptoms and signs involving the musculoskeletal system: Secondary | ICD-10-CM

## 2014-12-03 DIAGNOSIS — R2689 Other abnormalities of gait and mobility: Secondary | ICD-10-CM

## 2014-12-03 DIAGNOSIS — Z5189 Encounter for other specified aftercare: Secondary | ICD-10-CM | POA: Diagnosis not present

## 2014-12-03 NOTE — Therapy (Signed)
[facility] [address] [place], [place], [address] Phone: [phone]   Fax:  [phone]  Physical Therapy Treatment  Patient Details  Name: Manuel [name] MRN: [id number] Date of Birth: 02-04-1935  Encounter Date: 12/03/2014      PT End of Session - 12/03/14 1509    Visit Number 4   Number of Visits 8   Date for PT Re-Evaluation 12/20/14   PT Start Time 1430   PT Stop Time 1512   PT Time Calculation (min) 42 min   Activity Tolerance Patient tolerated treatment well      Past Medical History  Diagnosis Date  . Hypertension   . Back pain   . Cancer     PROSTATE -- RADIATION SEED IMPLANT  . Diabetes mellitus     DX SINCE 1998    Past Surgical History  Procedure Laterality Date  . Back surgery    . Prostate surgery      with seed implants  . Cholecystectomy    . Vasectomy    . Eye surgery      CATARACT BOTH EYES 2014  . Transurethral resection of prostate  2012  . Total hip arthroplasty Right 04/20/2014    Procedure: RIGHT TOTAL HIP ARTHROPLASTY ANTERIOR APPROACH;  Surgeon: Gearlean Alf, MD;  Location: [place];  Service: Orthopedics;  Laterality: Right;  . Cystoscopy with urethral dilatation N/A 04/20/2014    Procedure: CYSTOSCOPY WITH URETHRAL DILATATION AND FOLEY PLACEMENT;  Surgeon: Gearlean Alf, MD;  Location: [place];  Service: Orthopedics;  Laterality: N/A;    There were no vitals taken for this visit.  Visit Diagnosis:  Leg weakness, bilateral  Poor balance      Subjective Assessment - 12/03/14 1433    Symptoms No complaints of pain.  Pt reports he has started doing his HEP.            [facility] PT Assessment - 12/03/14 1434    Assessment   Medical Diagnosis Rt hip replacement   Onset Date 04/20/14   Next MD Visit 03/2015   Prior Therapy for back          [facility] Adult PT Treatment/Exercise - 12/03/14 1430    Exercises   Exercises Knee/Hip   Knee/Hip Exercises: Stretches   Active Hamstring Stretch 3 reps;30  seconds   Active Hamstring Stretch Limitations 14" Box   Quad Stretch 3 reps;30 seconds   Quad Stretch Limitations prone with rope   Gastroc Stretch 3 reps;30 seconds   Gastroc Stretch Limitations Slantboard   Knee/Hip Exercises: Standing   Heel Raises 10 reps   Heel Raises Limitations with squat to 14" box and yellow ball   Lateral Step Up 10 reps;Hand Hold: 1;Step Height: 6";Both   Forward Step Up 10 reps;Hand Hold: 1;Step Height: 6";Both   SLS with Vectors Tandem: Even Rt 49.59", Lt 52.37"  Uneven Rt 3.58", Lt 2.1"     Other Standing Knee Exercises Monster Walk, RTB, 30 feet RT   Knee/Hip Exercises: Seated   Other Seated Knee Exercises Sit <-> Stand 2x7, no UE (timed : 31.10", 29.89)    Knee/Hip Exercises: Prone   Hip Extension 2 sets;10 reps;Both            PT Short Term Goals - 12/03/14 1513    PT SHORT TERM GOAL #1   Title Pt to be able to complete 7 sit to stand in 30 seconds to demonstrate improved power( norm for pt age is 11-17)  Baseline 12/03/14 : 31.10 and 29.89 on 2 trials   Status On-going   PT SHORT TERM GOAL #2   Title Pt to be walking for ten minutes at least 3 times a week    Status On-going   PT SHORT TERM GOAL #3   Title Pt to be I with HEP   Status On-going          PT Long Term Goals - 12/03/14 1513    PT LONG TERM GOAL #1   Title Pt strerngth to be improved one grade to allow pt to walk for 15 minutes without stopping   Status On-going   PT LONG TERM GOAL #2   Title Pt to be able to complete 11 sit to stand in 30 seconds for improved power   Status On-going   PT LONG TERM GOAL #3   Title Pt SLS to be 15 sec B  to reduce risk of falling    Status On-going          Plan - 12/03/14 1510    Clinical Impression Statement Continued strengthening program for lower body, with most complaints of difficulty with monster walks.  VC for technique to avoid compensations of use of hip flexor vs. only hip abductors.  Tandem balance assessed today,  with good balance on even terrain though pt had a lot of difficulty on Rt and Lt side with balance on uneven terrain.  VC for posture throughout session, to avoid looking down during exercises and to incorporate ankle reactions (over vision) with balance exercises.    Pt will benefit from skilled therapeutic intervention in order to improve on the following deficits Decreased activity tolerance;Decreased balance;Decreased strength;Difficulty walking;Pain   PT Frequency 2x / week   PT Duration 4 weeks   PT Treatment/Interventions Gait training;Therapeutic activities;Therapeutic exercise;Balance training;Patient/family education   PT Next Visit Plan Progress strengthening program, continuing lunging and adding side lunges as able.         Problem List Patient Active Problem List   Diagnosis Date Noted  . OA (osteoarthritis) of hip 04/20/2014  . Bladder neck contracture 04/20/2014  . Lumbago 07/05/2012  . Spondylosis with myelopathy, lumbar region 07/05/2012  . Degenerative arthritis of hip 09/22/2011  . Degenerative disc disease, lumbar 09/22/2011  . Acute on chronic renal failure 08/07/2011  . BPH (benign prostatic hyperplasia) 08/07/2011  . Prostate cancer 08/07/2011  . Hypertension 08/07/2011  . Diabetes mellitus type 2 with complications 78/67/6720  . Lumbar disc disease 08/07/2011   Lonna Cobb, DPT [phone]

## 2014-12-05 ENCOUNTER — Ambulatory Visit (HOSPITAL_COMMUNITY)
Admission: RE | Admit: 2014-12-05 | Discharge: 2014-12-05 | Disposition: A | Discharge status: Home / Self Care | Payer: Medicare Other | Source: Ambulatory Visit | Attending: Internal Medicine | Admitting: Internal Medicine

## 2014-12-05 ENCOUNTER — Ambulatory Visit: Payer: Medicare Other | Attending: Internal Medicine | Admitting: Sleep Medicine

## 2014-12-05 DIAGNOSIS — G4730 Sleep apnea, unspecified: Secondary | ICD-10-CM

## 2014-12-05 DIAGNOSIS — R2689 Other abnormalities of gait and mobility: Secondary | ICD-10-CM

## 2014-12-05 DIAGNOSIS — Z7982 Long term (current) use of aspirin: Secondary | ICD-10-CM | POA: Insufficient documentation

## 2014-12-05 DIAGNOSIS — R29898 Other symptoms and signs involving the musculoskeletal system: Secondary | ICD-10-CM

## 2014-12-05 DIAGNOSIS — G4733 Obstructive sleep apnea (adult) (pediatric): Secondary | ICD-10-CM | POA: Diagnosis not present

## 2014-12-05 DIAGNOSIS — Z79899 Other long term (current) drug therapy: Secondary | ICD-10-CM | POA: Insufficient documentation

## 2014-12-05 DIAGNOSIS — Z5189 Encounter for other specified aftercare: Secondary | ICD-10-CM | POA: Diagnosis not present

## 2014-12-05 NOTE — Therapy (Signed)
[facility] [address] [place], [place], [address] Phone: [phone]   Fax:  [phone]  Physical Therapy Treatment  Patient Details  Name: Manuel [name] MRN: [id number] Date of Birth: 01/16/1935  Encounter Date: 12/05/2014      PT End of Session - 12/05/14 1504    Visit Number 5   Number of Visits 8   Date for PT Re-Evaluation 12/20/14   Authorization Type UHC M   PT Start Time 3500   PT Stop Time 1515   PT Time Calculation (min) 38 min   Equipment Utilized During Treatment Gait belt   Activity Tolerance Patient limited by fatigue;Patient tolerated treatment well   Behavior During Therapy [facility] for tasks assessed/performed      Past Medical History  Diagnosis Date  . Hypertension   . Back pain   . Cancer     PROSTATE -- RADIATION SEED IMPLANT  . Diabetes mellitus     DX SINCE 1998    Past Surgical History  Procedure Laterality Date  . Back surgery    . Prostate surgery      with seed implants  . Cholecystectomy    . Vasectomy    . Eye surgery      CATARACT BOTH EYES 2014  . Transurethral resection of prostate  2012  . Total hip arthroplasty Right 04/20/2014    Procedure: RIGHT TOTAL HIP ARTHROPLASTY ANTERIOR APPROACH;  Surgeon: Gearlean Alf, MD;  Location: [place];  Service: Orthopedics;  Laterality: Right;  . Cystoscopy with urethral dilatation N/A 04/20/2014    Procedure: CYSTOSCOPY WITH URETHRAL DILATATION AND FOLEY PLACEMENT;  Surgeon: Gearlean Alf, MD;  Location: [place];  Service: Orthopedics;  Laterality: N/A;    There were no vitals taken for this visit.  Visit Diagnosis:  Leg weakness, bilateral  Poor balance      Subjective Assessment - 12/05/14 1440    Symptoms Pt stated he is not feeling good today, no reports of pain.     Currently in Pain? No/denies          [facility] PT Assessment - 12/05/14 0001    Assessment   Medical Diagnosis Rt hip replacement   Onset Date 04/20/14   Next MD Visit [place] 5 months    Prior Therapy for back          [facility] Adult PT Treatment/Exercise - 12/05/14 1442    Exercises   Exercises Knee/Hip   Knee/Hip Exercises: Stretches   Active Hamstring Stretch 3 reps;30 seconds   Active Hamstring Stretch Limitations 14" Box   Gastroc Stretch 3 reps;30 seconds   Gastroc Stretch Limitations Slantboard   Knee/Hip Exercises: Standing   Heel Raises 15 reps   Heel Raises Limitations with squat to 14" box and yellow ball   Forward Lunges Both;10 reps   Forward Lunges Limitations 7in step   Side Lunges Both;5 reps;Limitations   Side Lunges Limitations 7in step   Functional Squat 10 reps   Knee/Hip Exercises: Seated   Other Seated Knee Exercises 5 STS no HHA form chair height            PT Short Term Goals - 12/05/14 1519    PT SHORT TERM GOAL #1   Title Pt to be able to complete 7 sit to stand in 30 seconds to demonstrate improved power( norm for pt age is 37-17)   Status On-going   PT SHORT TERM GOAL #2   Title Pt to be walking for  ten minutes at least 3 times a week    Status On-going   PT SHORT TERM GOAL #3   Title Pt to be I with HEP          PT Long Term Goals - 12/05/14 1526    PT LONG TERM GOAL #1   Title Pt strerngth to be improved one grade to allow pt to walk for 15 minutes without stopping   Status On-going   PT LONG TERM GOAL #2   Title Pt to be able to complete 11 sit to stand in 30 seconds for improved power   PT LONG TERM GOAL #3   Title Pt SLS to be 15 sec B  to reduce risk of falling           Plan - 12/05/14 1514    Clinical Impression Statement Pt limited by fatigue through session, reported he had not eaten since breakfast.  Pt encouraged to eat prior therapy to assist with fatigue.  Added lateral lunges for glut med strengthening with therapist facilitation for appropraite weight loading for maximum strengtehning benefits to assist with balance.  Pt required multiple rest breaks due to fatigue through session.     PT Next  Visit Plan Continue with current PT POC for strengtheing program, progress balance as able.        Problem List Patient Active Problem List   Diagnosis Date Noted  . OA (osteoarthritis) of hip 04/20/2014  . Bladder neck contracture 04/20/2014  . Lumbago 07/05/2012  . Spondylosis with myelopathy, lumbar region 07/05/2012  . Degenerative arthritis of hip 09/22/2011  . Degenerative disc disease, lumbar 09/22/2011  . Acute on chronic renal failure 08/07/2011  . BPH (benign prostatic hyperplasia) 08/07/2011  . Prostate cancer 08/07/2011  . Hypertension 08/07/2011  . Diabetes mellitus type 2 with complications 43/32/9518  . Lumbar disc disease 08/07/2011   Ihor Austin, Frederika Aldona Lento 12/05/2014, 5:36 PM

## 2014-12-10 ENCOUNTER — Ambulatory Visit (HOSPITAL_COMMUNITY)
Admission: RE | Admit: 2014-12-10 | Discharge: 2014-12-10 | Disposition: A | Discharge status: Home / Self Care | Payer: Medicare Other | Source: Ambulatory Visit | Attending: Internal Medicine | Admitting: Internal Medicine

## 2014-12-10 DIAGNOSIS — R2689 Other abnormalities of gait and mobility: Secondary | ICD-10-CM

## 2014-12-10 DIAGNOSIS — Z5189 Encounter for other specified aftercare: Secondary | ICD-10-CM | POA: Diagnosis not present

## 2014-12-10 DIAGNOSIS — R29898 Other symptoms and signs involving the musculoskeletal system: Secondary | ICD-10-CM

## 2014-12-10 NOTE — Therapy (Signed)
[facility] [address] [place], [place], [address] Phone: [phone]   Fax:  [phone]  Physical Therapy Treatment  Patient Details  Name: Manuel [name] MRN: [id number] Date of Birth: 21-Jul-1935  Encounter Date: 12/10/2014      PT End of Session - 12/10/14 1605    Visit Number 6   Number of Visits 8   Date for PT Re-Evaluation 12/20/14   Authorization Type UHC M   PT Start Time 1435   PT Stop Time 1516   PT Time Calculation (min) 41 min   Activity Tolerance Patient limited by fatigue;Patient tolerated treatment well   Behavior During Therapy [facility] for tasks assessed/performed      Past Medical History  Diagnosis Date  . Hypertension   . Back pain   . Cancer     PROSTATE -- RADIATION SEED IMPLANT  . Diabetes mellitus     DX SINCE 1998    Past Surgical History  Procedure Laterality Date  . Back surgery    . Prostate surgery      with seed implants  . Cholecystectomy    . Vasectomy    . Eye surgery      CATARACT BOTH EYES 2014  . Transurethral resection of prostate  2012  . Total hip arthroplasty Right 04/20/2014    Procedure: RIGHT TOTAL HIP ARTHROPLASTY ANTERIOR APPROACH;  Surgeon: Gearlean Alf, MD;  Location: [place];  Service: Orthopedics;  Laterality: Right;  . Cystoscopy with urethral dilatation N/A 04/20/2014    Procedure: CYSTOSCOPY WITH URETHRAL DILATATION AND FOLEY PLACEMENT;  Surgeon: Gearlean Alf, MD;  Location: [place];  Service: Orthopedics;  Laterality: N/A;    There were no vitals taken for this visit.  Visit Diagnosis:  Leg weakness, bilateral  Poor balance      Subjective Assessment - 12/10/14 1733    Symptoms Pt reports his back is still bothering him, not som much his hip.    Currently in Pain? No/denies            [facility] Adult PT Treatment/Exercise - 12/10/14 1505    Exercises   Exercises Knee/Hip   Knee/Hip Exercises: Stretches   Active Hamstring Stretch 3 reps;30 seconds   Active Hamstring  Stretch Limitations 14" Box   Gastroc Stretch 3 reps;30 seconds   Gastroc Stretch Limitations Slantboard   Knee/Hip Exercises: Standing   Heel Raises 15 reps   Heel Raises Limitations with squat to 12" box and yellow ball   Forward Lunges Both;15 reps   Forward Lunges Limitations 6in step   Side Lunges Both;Limitations;10 reps   Side Lunges Limitations 6in step            PT Short Term Goals - 12/10/14 1551    PT SHORT TERM GOAL #1   Title Pt to be able to complete 7 sit to stand in 30 seconds to demonstrate improved power( norm for pt age is 40-17)   Status On-going   PT SHORT TERM GOAL #2   Title Pt to be walking for ten minutes at least 3 times a week    Status On-going   PT SHORT TERM GOAL #3   Title Pt to be I with HEP          PT Long Term Goals - 12/10/14 1551    PT LONG TERM GOAL #1   Title Pt strerngth to be improved one grade to allow pt to walk for 15 minutes without stopping  Status On-going   PT LONG TERM GOAL #2   Title Pt to be able to complete 11 sit to stand in 30 seconds for improved power   PT LONG TERM GOAL #3   Title Pt SLS to be 15 sec B  to reduce risk of falling           Plan - 12/10/14 1730    Clinical Impression Statement Pt able to complete session tdoay without fatigue as noted in last session.  Pt requires therapist facilitation as he does not count/keep track of therex.  Pt wtihout complaint with any exercise today.  Able to progress lunges to lower step today.   PT Next Visit Plan Continue with current PT POC for strengtheing program, progress balance as able.  Re-evaluate X 2 more visits.        Problem List Patient Active Problem List   Diagnosis Date Noted  . OA (osteoarthritis) of hip 04/20/2014  . Bladder neck contracture 04/20/2014  . Lumbago 07/05/2012  . Spondylosis with myelopathy, lumbar region 07/05/2012  . Degenerative arthritis of hip 09/22/2011  . Degenerative disc disease, lumbar 09/22/2011  . Acute on  chronic renal failure 08/07/2011  . BPH (benign prostatic hyperplasia) 08/07/2011  . Prostate cancer 08/07/2011  . Hypertension 08/07/2011  . Diabetes mellitus type 2 with complications 08/10/4817  . Lumbar disc disease 08/07/2011    Teena Irani, PTA/CLT [phone] 12/10/2014, 5:34 PM

## 2014-12-10 NOTE — Sleep Study (Signed)
Correction to the previously note: The study was actually a split-night study. The initial diagnostic AHI is 17. Additionally, the PLM index indicates that the patient has severe periodic limb movement disorder. Consider dopamine agonist if clinically indicated.

## 2014-12-10 NOTE — Sleep Study (Signed)
  Derma A. Merlene Laughter, MD     [email or web address]        NOCTURNAL POLYSOMNOGRAM    LOCATION: SLEEP LAB FACILITY: [place]   PHYSICIAN: Sephora Boyar A. Merlene Laughter, M.D.   DATE OF STUDY: 12/05/2014.   REFERRING PHYSICIAN: Asencion Noble.   INDICATIONS: Patient is 79 year old who has documentation of obstructive sleep apnea syndrome CPAP titration recording.  MEDICATIONS:  Prior to Admission medications   Medication Sig Start Date End Date Taking? Authorizing Provider  amLODipine (NORVASC) 5 MG tablet Take 5 mg by mouth daily.     Historical Provider, MD  aspirin EC 81 MG tablet Take 81 mg by mouth daily.    Historical Provider, MD  enalapril (VASOTEC) 10 MG tablet Take 10 mg by mouth 2 (two) times daily.  09/21/11   Historical Provider, MD  enoxaparin (LOVENOX) 40 MG/0.4ML injection Inject 0.4 mLs (40 mg total) into the skin daily. Patient not taking: Reported on 11/20/2014 04/22/14   Gearlean Alf, MD  fenofibrate 160 MG tablet Take 160 mg by mouth daily.     Historical Provider, MD  gabapentin (NEURONTIN) 600 MG tablet Take 600 mg by mouth 6 (six) times daily. 1 tab six times a day    Historical Provider, MD  glimepiride (AMARYL) 2 MG tablet Take 2 mg by mouth daily before breakfast.    Historical Provider, MD  methocarbamol (ROBAXIN) 500 MG tablet Take 1 tablet (500 mg total) by mouth every 6 (six) hours as needed for muscle spasms. Patient not taking: Reported on 11/20/2014 04/22/14   Gearlean Alf, MD  ONE TOUCH ULTRA TEST test strip  09/15/11   Historical Provider, MD  oxyCODONE (OXY IR/ROXICODONE) 5 MG immediate release tablet Take 1-2 tablets (5-10 mg total) by mouth every 3 (three) hours as needed for breakthrough pain. Patient not taking: Reported on 11/20/2014 04/22/14   Gearlean Alf, MD  promethazine (PHENERGAN) 25 MG tablet Take 1 tablet (25 mg total) by mouth every 6 (six) hours as needed for nausea or vomiting. Patient not taking: Reported on 11/20/2014  11/21/13   Carole Civil, MD  rivastigmine (EXELON) 3 MG capsule Take 3 mg by mouth 2 (two) times daily.     Historical Provider, MD  traMADol (ULTRAM) 50 MG tablet Take 1-2 tablets (50-100 mg total) by mouth every 6 (six) hours as needed for moderate pain. Patient not taking: Reported on 11/20/2014 04/22/14   Gearlean Alf, MD      EPWORTH SLEEPINESS SCALE: 15.   BMI: 33.   ARCHITECTURAL SUMMARY: Total recording time was 413 minutes. Sleep efficiency 78 %. Sleep latency 38 minutes. REM latency 84 minutes. Stage NI 13 %, N2 71 % and N3 1 % and REM sleep 15 %.    RESPIRATORY DATA:  Baseline oxygen saturation is 95 %. The lowest saturation is 73 %. The patient was placed on positive pressure starting at 4 and increase to 8. The optimal pressure is 8 with resolution of obstructive events and good tolerance. Lobes oxygen saturation on the optimal pressure is 85%.  LIMB MOVEMENT SUMMARY: PLM index 69.   ELECTROCARDIOGRAM SUMMARY: Average heart rate is 51 with no significant dysrhythmias observed.   IMPRESSION:  1. Obstructive sleep apnea syndrome which responds well to a CPAP of 8.  Thanks for this referral.  Sherlene Rickel A. Merlene Laughter, M.D. Diplomat, Tax adviser of Sleep Medicine.

## 2014-12-12 ENCOUNTER — Ambulatory Visit (HOSPITAL_COMMUNITY)
Admission: RE | Admit: 2014-12-12 | Discharge: 2014-12-12 | Disposition: A | Discharge status: Home / Self Care | Payer: Medicare Other | Source: Ambulatory Visit | Attending: Internal Medicine | Admitting: Internal Medicine

## 2014-12-12 DIAGNOSIS — R29898 Other symptoms and signs involving the musculoskeletal system: Secondary | ICD-10-CM

## 2014-12-12 DIAGNOSIS — R2689 Other abnormalities of gait and mobility: Secondary | ICD-10-CM

## 2014-12-12 DIAGNOSIS — Z5189 Encounter for other specified aftercare: Secondary | ICD-10-CM | POA: Diagnosis not present

## 2014-12-12 NOTE — Therapy (Signed)
[facility] [address] [place], [place], [address] Phone: [phone]   Fax:  [phone]  Physical Therapy Treatment  Patient Details  Name: Manuel [name] MRN: [id number] Date of Birth: 1935-10-25  Encounter Date: 12/12/2014      PT End of Session - 12/12/14 1527    Visit Number 7   Number of Visits 8   Date for PT Re-Evaluation 12/20/14   Authorization Type UHC M   PT Start Time 1430   PT Stop Time 1515   PT Time Calculation (min) 45 min   Activity Tolerance Patient limited by fatigue;Patient tolerated treatment well   Behavior During Therapy [facility] for tasks assessed/performed      Past Medical History  Diagnosis Date  . Hypertension   . Back pain   . Cancer     PROSTATE -- RADIATION SEED IMPLANT  . Diabetes mellitus     DX SINCE 1998    Past Surgical History  Procedure Laterality Date  . Back surgery    . Prostate surgery      with seed implants  . Cholecystectomy    . Vasectomy    . Eye surgery      CATARACT BOTH EYES 2014  . Transurethral resection of prostate  2012  . Total hip arthroplasty Right 04/20/2014    Procedure: RIGHT TOTAL HIP ARTHROPLASTY ANTERIOR APPROACH;  Surgeon: Gearlean Alf, MD;  Location: [place];  Service: Orthopedics;  Laterality: Right;  . Cystoscopy with urethral dilatation N/A 04/20/2014    Procedure: CYSTOSCOPY WITH URETHRAL DILATATION AND FOLEY PLACEMENT;  Surgeon: Gearlean Alf, MD;  Location: [place];  Service: Orthopedics;  Laterality: N/A;    There were no vitals taken for this visit.  Visit Diagnosis:  Leg weakness, bilateral  Poor balance      Subjective Assessment - 12/12/14 1526    Symptoms Pt states his knees are still bothering him and giving way at times.  Currently reports no pain.  Pt reports he is getting married the first of the year.   Currently in Pain? No/denies            [facility] Adult PT Treatment/Exercise - 12/12/14 1438    Exercises   Exercises Knee/Hip   Knee/Hip  Exercises: Stretches   Active Hamstring Stretch 1 rep;30 seconds   Active Hamstring Stretch Limitations 14" Box   Gastroc Stretch 1 rep;30 seconds   Gastroc Stretch Limitations Slantboard   Knee/Hip Exercises: Standing   Heel Raises 15 reps   Heel Raises Limitations with squat to 12" box and yellow ball   Forward Lunges Both;15 reps   Forward Lunges Limitations 6in step   Side Lunges Both;Limitations;10 reps   Side Lunges Limitations 6in step   Other Standing Knee Exercises Monster Walk, RTB, 30 feet RT   Knee/Hip Exercises: Seated   Other Seated Knee Exercises 10 STS no HHA from chair height            PT Short Term Goals - 12/12/14 1527    PT SHORT TERM GOAL #1   Title Pt to be able to complete 7 sit to stand in 30 seconds to demonstrate improved power( norm for pt age is 75-17)   Status On-going   PT SHORT TERM GOAL #2   Title Pt to be walking for ten minutes at least 3 times a week    Status On-going   PT SHORT TERM GOAL #3   Title Pt to be I with HEP  PT Long Term Goals - 12/12/14 1527    PT LONG TERM GOAL #1   Title Pt strerngth to be improved one grade to allow pt to walk for 15 minutes without stopping   Status On-going   PT LONG TERM GOAL #2   Title Pt to be able to complete 11 sit to stand in 30 seconds for improved power   PT LONG TERM GOAL #3   Title Pt SLS to be 15 sec B  to reduce risk of falling           Plan - 12/12/14 1528    Clinical Impression Statement Resumed balance and stability exercises.  Pt with most instability with retro ambulation.  Pt with improving form with all exercises and improved eccentric control with stand to sit.    PT Next Visit Plan Continue with current PT POC for strengtheing program, progress balance as able.  Re-evaluate next visit.                               Problem List Patient Active Problem List   Diagnosis Date Noted  . OA (osteoarthritis) of hip 04/20/2014  . Bladder neck  contracture 04/20/2014  . Lumbago 07/05/2012  . Spondylosis with myelopathy, lumbar region 07/05/2012  . Degenerative arthritis of hip 09/22/2011  . Degenerative disc disease, lumbar 09/22/2011  . Acute on chronic renal failure 08/07/2011  . BPH (benign prostatic hyperplasia) 08/07/2011  . Prostate cancer 08/07/2011  . Hypertension 08/07/2011  . Diabetes mellitus type 2 with complications 17/61/6073  . Lumbar disc disease 08/07/2011    Teena Irani, PTA/CLT [phone] 12/12/2014, 3:31 PM

## 2014-12-17 ENCOUNTER — Ambulatory Visit (HOSPITAL_COMMUNITY)
Admission: RE | Admit: 2014-12-17 | Discharge: 2014-12-17 | Disposition: A | Discharge status: Home / Self Care | Payer: Medicare Other | Source: Ambulatory Visit | Attending: Internal Medicine | Admitting: Internal Medicine

## 2014-12-17 DIAGNOSIS — R29898 Other symptoms and signs involving the musculoskeletal system: Secondary | ICD-10-CM

## 2014-12-17 DIAGNOSIS — Z5189 Encounter for other specified aftercare: Secondary | ICD-10-CM | POA: Diagnosis not present

## 2014-12-17 DIAGNOSIS — R2689 Other abnormalities of gait and mobility: Secondary | ICD-10-CM

## 2014-12-17 NOTE — Therapy (Signed)
[place], [place], [address] Phone: [phone]   Fax:  [phone]  Physical Therapy Re-Evaluation  Patient Details  Name: Manuel [name] MRN: [id number] Date of Birth: 04-18-1935  Encounter Date: 12/17/2014      PT End of Session - 12/17/14 1842    Visit Number 8   Number of Visits 16   Date for PT Re-Evaluation 01/16/15   Authorization Time Period G-code updated visit #8   PT Start Time 1430   PT Stop Time 1516   PT Time Calculation (min) 46 min   Equipment Utilized During Treatment Gait belt   Activity Tolerance Patient tolerated treatment well   Behavior During Therapy [facility] for tasks assessed/performed      Past Medical History  Diagnosis Date  . Hypertension   . Back pain   . Cancer     PROSTATE -- RADIATION SEED IMPLANT  . Diabetes mellitus     DX SINCE 1998    Past Surgical History  Procedure Laterality Date  . Back surgery    . Prostate surgery      with seed implants  . Cholecystectomy    . Vasectomy    . Eye surgery      CATARACT BOTH EYES 2014  . Transurethral resection of prostate  2012  . Total hip arthroplasty Right 04/20/2014    Procedure: RIGHT TOTAL HIP ARTHROPLASTY ANTERIOR APPROACH;  Surgeon: Gearlean Alf, MD;  Location: [place];  Service: Orthopedics;  Laterality: Right;  . Cystoscopy with urethral dilatation N/A 04/20/2014    Procedure: CYSTOSCOPY WITH URETHRAL DILATATION AND FOLEY PLACEMENT;  Surgeon: Gearlean Alf, MD;  Location: [place];  Service: Orthopedics;  Laterality: N/A;    There were no vitals taken for this visit.  Visit Diagnosis:  Poor balance  Leg weakness, bilateral      Subjective Assessment - 12/17/14 1453    Symptoms No complaints of pain in Rt hip.  Pt reports most complaints of discomfort in the low back.    How long can you sit comfortably? No difficulties sitting in recliner.     How long can you stand comfortably? Able to stand for 15 minutes  prior to seated rest break secondary to LBP.    How long can you walk comfortably? Pt is able to amb without an AD for 5 minutes before taking a standing rest break, then able to continue on (gait distance limited by LBP).           [facility] PT Assessment - 12/17/14 1432    Assessment   Medical Diagnosis Rt hip replacement   Onset Date 04/20/14   Next MD Visit [place] 5 months   Observation/Other Assessments   Focus on Therapeutic Outcomes (FOTO)  FOTO limitation 39% (was 33%)   Strength   Right Hip Flexion --  4-/5 (was 3+/5)   Right Hip Extension 3+/5  was 2+/5   Right Hip ABduction 3+/5  was 3-/5   Left Hip Flexion --  4-/5 (was 3+/5)   Left Hip Extension --  4-/5 (was 3+/5)   Left Hip ABduction 3+/5  was 5/5   Right Knee Extension 4/5  was 4-/5   Left Knee Extension 5/5  was 5/5   Right Ankle Dorsiflexion 4/5  was 3+/5   Left Ankle Dorsiflexion 4/5  was 3+/5   Flexibility   Soft Tissue Assessment /Muscle Lenght yes   Hamstrings Rt 79 degrees, Lt 75 degrees  Quadriceps Rt 5 3/4 in, 6 in to glute    Static Standing Balance   Static Standing - Comment/# of Minutes 32                  [facility] Adult PT Treatment/Exercise - 01-03-2015 0001    Knee/Hip Exercises: Stretches   Active Hamstring Stretch 1 rep;30 seconds   Active Hamstring Stretch Limitations 17" Box   Quad Stretch 1 rep;30 seconds   Quad Stretch Limitations manual   Gastroc Stretch 1 rep;30 seconds   Gastroc Stretch Limitations Slantboard   Knee/Hip Exercises: Prone   Hip Extension 10 reps;Both                  PT Short Term Goals - 2015-01-03 1842    PT SHORT TERM GOAL #1   Title Pt to be able to complete 7 sit to stand in 30 seconds to demonstrate improved power( norm for pt age is 48-17)   Status On-going   PT SHORT TERM GOAL #2   Title Pt to be walking for ten minutes at least 3 times a week    Status On-going   PT SHORT TERM GOAL #3   Title Pt to be I with HEP   Status On-going            PT Long Term Goals - 2015-01-03 1842    PT LONG TERM GOAL #1   Title Pt strerngth to be improved one grade to allow pt to walk for 15 minutes without stopping   Status On-going   PT LONG TERM GOAL #2   Title Pt to be able to complete 11 sit to stand in 30 seconds for improved power   Status On-going   PT LONG TERM GOAL #3   Status On-going               Plan - 01-03-2015 1844    Clinical Impression Statement Re-evaluation completed.  Pt is progressing towards all goals, showing improvements in strengthening towards short and long term goals.  Pt reports some difficulty with higher level functional activites secondary to balance issues, and Berg Balance to be assessed next visit.     Pt will benefit from skilled therapeutic intervention in order to improve on the following deficits Decreased activity tolerance;Decreased balance;Decreased strength;Difficulty walking;Pain   Rehab Potential Good   PT Frequency 2x / week   PT Duration 4 weeks   PT Treatment/Interventions Gait training;Therapeutic activities;Therapeutic exercise;Balance training;Patient/family education   PT Next Visit Plan Complete timed STS and Berg Balance next visit.           G-Codes - January 03, 2015 1514    Functional Assessment Tool Used FOTO   Functional Limitation Mobility: Walking and moving around   Mobility: Walking and Moving Around Current Status [phone]) At least 20 percent but less than 40 percent impaired, limited or restricted   Mobility: Walking and Moving Around Goal Status [phone]) At least 20 percent but less than 40 percent impaired, limited or restricted      Problem List Patient Active Problem List   Diagnosis Date Noted  . OA (osteoarthritis) of hip 04/20/2014  . Bladder neck contracture 04/20/2014  . Lumbago 07/05/2012  . Spondylosis with myelopathy, lumbar region 07/05/2012  . Degenerative arthritis of hip 09/22/2011  . Degenerative disc disease, lumbar 09/22/2011  . Acute  on chronic renal failure 08/07/2011  . BPH (benign prostatic hyperplasia) 08/07/2011  . Prostate cancer 08/07/2011  . Hypertension 08/07/2011  . Diabetes  mellitus type 2 with complications 50/02/7047  . Lumbar disc disease 08/07/2011   Lonna Cobb, DPT [phone]  [facility] [address] [place], [place], [address] Phone: [phone]   Fax:  [phone]

## 2014-12-19 ENCOUNTER — Ambulatory Visit (HOSPITAL_COMMUNITY)
Admission: RE | Admit: 2014-12-19 | Discharge: 2014-12-19 | Disposition: A | Discharge status: Home / Self Care | Payer: Medicare Other | Source: Ambulatory Visit | Attending: Internal Medicine | Admitting: Internal Medicine

## 2014-12-19 DIAGNOSIS — R29898 Other symptoms and signs involving the musculoskeletal system: Secondary | ICD-10-CM

## 2014-12-19 DIAGNOSIS — Z5189 Encounter for other specified aftercare: Secondary | ICD-10-CM | POA: Diagnosis not present

## 2014-12-19 DIAGNOSIS — R2689 Other abnormalities of gait and mobility: Secondary | ICD-10-CM

## 2014-12-19 NOTE — Therapy (Signed)
[place], [place], [address] Phone: [phone]   Fax:  [phone]  Physical Therapy Treatment  Patient Details  Name: Manuel [name] MRN: [id number] Date of Birth: 11-28-1935  Encounter Date: 12/19/2014      PT End of Session - 12/19/14 1514    Visit Number 9   Number of Visits 16   Date for PT Re-Evaluation 01/16/15   Authorization Type UHC M   Authorization Time Period G-code updated visit #8   PT Start Time 1422   PT Stop Time 1514   PT Time Calculation (min) 52 min   Equipment Utilized During Treatment Gait belt      Past Medical History  Diagnosis Date  . Hypertension   . Back pain   . Cancer     PROSTATE -- RADIATION SEED IMPLANT  . Diabetes mellitus     DX SINCE 1998    Past Surgical History  Procedure Laterality Date  . Back surgery    . Prostate surgery      with seed implants  . Cholecystectomy    . Vasectomy    . Eye surgery      CATARACT BOTH EYES 2014  . Transurethral resection of prostate  2012  . Total hip arthroplasty Right 04/20/2014    Procedure: RIGHT TOTAL HIP ARTHROPLASTY ANTERIOR APPROACH;  Surgeon: Gearlean Alf, MD;  Location: [place];  Service: Orthopedics;  Laterality: Right;  . Cystoscopy with urethral dilatation N/A 04/20/2014    Procedure: CYSTOSCOPY WITH URETHRAL DILATATION AND FOLEY PLACEMENT;  Surgeon: Gearlean Alf, MD;  Location: [place];  Service: Orthopedics;  Laterality: N/A;    There were no vitals taken for this visit.  Visit Diagnosis:  Poor balance  Leg weakness, bilateral      Subjective Assessment - 12/19/14 1421    Symptoms Pt states he is sore from yesterday treatment.  Note that his balance is still off   Currently in Pain? No/denies          [facility] PT Assessment - 12/19/14 0001    Balance   Balance Assessed Yes   Standardized Balance Assessment   Standardized Balance Assessment Berg Balance Test   Berg Balance Test   Sit to Stand  Able to stand without using hands and stabilize independently   Standing Unsupported Able to stand safely 2 minutes   Sitting with Back Unsupported but Feet Supported on Floor or Stool Able to sit safely and securely 2 minutes   Stand to Sit Sits safely with minimal use of hands   Transfers Able to transfer safely, minor use of hands   Standing Unsupported with Eyes Closed Able to stand 10 seconds safely   Standing Ubsupported with Feet Together Able to place feet together independently and stand 1 minute safely   From Standing, Reach Forward with Outstretched Arm Can reach forward >12 cm safely (5")   From Standing Position, Pick up Object from Floor Unable to pick up shoe, but reaches 2-5 cm (1-2") from shoe and balances independently   From Standing Position, Turn to Look Behind Over each Shoulder Looks behind one side only/other side shows less weight shift   Turn 360 Degrees Able to turn 360 degrees safely one side only in 4 seconds or less   Standing Unsupported, Alternately Place Feet on Step/Stool Able to stand independently and safely and complete 8 steps in 20 seconds   Standing Unsupported, One Foot in [place] to  plae foot ahead of the other independently and hold 30 seconds   Standing on One Leg Able to lift leg independently and hold equal to or more than 3 seconds   Total Score 48     sit to stand 8/30seconds              [facility] Adult PT Treatment/Exercise - 12/19/14 0001    Knee/Hip Exercises: Stretches   Active Hamstring Stretch 1 rep;30 seconds   Active Hamstring Stretch Limitations 18" stool   Gastroc Stretch 2 reps;30 seconds   Knee/Hip Exercises: Standing   Heel Raises 10 reps   Heel Raises Limitations with squats using yellow ball    Lateral Step Up Right;15 reps;Step Height: 4"   Forward Step Up Right;15 reps;Step Height: 6"   Stairs 3 RT 8" step    SLS with Vectors 3 x with 10" vector    Gait Training hip abduction 4#    Other Standing Knee Exercises  3D hip excursion x 5    Other Standing Knee Exercises tandem gt x 2 RT; retro gt x 2RT    Knee/Hip Exercises: Seated   Other Seated Knee Exercises 10 sit to stand              PT Short Term Goals - 12/17/14 1842    PT SHORT TERM GOAL #1   Title Pt to be able to complete 7 sit to stand in 30 seconds to demonstrate improved power( norm for pt age is 38-17)   Status achieved   PT SHORT TERM GOAL #2   Title Pt to be walking for ten minutes at least 3 times a week    Status On-going   PT SHORT TERM GOAL #3   Title Pt to be I with HEP   Status On-going           PT Long Term Goals - 12/17/14 1842    PT LONG TERM GOAL #1   Title Pt strerngth to be improved one grade to allow pt to walk for 15 minutes without stopping   Status On-going   PT LONG TERM GOAL #2   Title Pt to be able to complete 11 sit to stand in 30 seconds for improved power   Status On-going   PT LONG TERM GOAL #3   Status On-going               Plan - 12/19/14 1515    Clinical Impression Statement Pt Berg score 48/54; able to complete 8 sit to stand in 30 seconds.  Today session focused on balance and strengthening glut medius and maximus.  all exercises were faciliataed by therapist for improved form.    PT Next Visit Plan resume lunges into program as well a nustep         Problem List Patient Active Problem List   Diagnosis Date Noted  . OA (osteoarthritis) of hip 04/20/2014  . Bladder neck contracture 04/20/2014  . Lumbago 07/05/2012  . Spondylosis with myelopathy, lumbar region 07/05/2012  . Degenerative arthritis of hip 09/22/2011  . Degenerative disc disease, lumbar 09/22/2011  . Acute on chronic renal failure 08/07/2011  . BPH (benign prostatic hyperplasia) 08/07/2011  . Prostate cancer 08/07/2011  . Hypertension 08/07/2011  . Diabetes mellitus type 2 with complications 12/87/8676  . Lumbar disc disease 08/07/2011    RUSSELL,CINDY PT  12/19/2014, 3:18 PM  [place], [place], [address] Phone: [phone]  Fax:  [phone]

## 2014-12-24 ENCOUNTER — Encounter (HOSPITAL_COMMUNITY): Payer: Medicare Other | Admitting: Physical Therapy

## 2014-12-25 ENCOUNTER — Ambulatory Visit (HOSPITAL_COMMUNITY)
Admission: RE | Admit: 2014-12-25 | Discharge: 2014-12-25 | Disposition: A | Discharge status: Home / Self Care | Payer: Medicare Other | Source: Ambulatory Visit | Attending: Internal Medicine | Admitting: Internal Medicine

## 2014-12-25 DIAGNOSIS — R2689 Other abnormalities of gait and mobility: Secondary | ICD-10-CM

## 2014-12-25 DIAGNOSIS — Z5189 Encounter for other specified aftercare: Secondary | ICD-10-CM | POA: Diagnosis not present

## 2014-12-25 DIAGNOSIS — R29898 Other symptoms and signs involving the musculoskeletal system: Secondary | ICD-10-CM

## 2014-12-25 NOTE — Therapy (Signed)
[facility] [address] [place], [place], [address] Phone: [phone]   Fax:  [phone]  Physical Therapy Treatment  Patient Details  Name: Manuel [name] MRN: [id number] Date of Birth: 1935/05/17  Encounter Date: 12/25/2014      PT End of Session - 12/25/14 1621    Visit Number 10   Number of Visits 16   Date for PT Re-Evaluation 01/16/15   Authorization Type UHC M   PT Start Time 1436   PT Stop Time 1524   PT Time Calculation (min) 48 min   Equipment Utilized During Treatment Gait belt   Activity Tolerance Patient tolerated treatment well      Past Medical History  Diagnosis Date  . Hypertension   . Back pain   . Cancer     PROSTATE -- RADIATION SEED IMPLANT  . Diabetes mellitus     DX SINCE 1998    Past Surgical History  Procedure Laterality Date  . Back surgery    . Prostate surgery      with seed implants  . Cholecystectomy    . Vasectomy    . Eye surgery      CATARACT BOTH EYES 2014  . Transurethral resection of prostate  2012  . Total hip arthroplasty Right 04/20/2014    Procedure: RIGHT TOTAL HIP ARTHROPLASTY ANTERIOR APPROACH;  Surgeon: Gearlean Alf, MD;  Location: [facility];  Service: Orthopedics;  Laterality: Right;  . Cystoscopy with urethral dilatation N/A 04/20/2014    Procedure: CYSTOSCOPY WITH URETHRAL DILATATION AND FOLEY PLACEMENT;  Surgeon: Gearlean Alf, MD;  Location: [facility];  Service: Orthopedics;  Laterality: N/A;    There were no vitals taken for this visit.  Visit Diagnosis:  Poor balance  Leg weakness, bilateral      Subjective Assessment - 12/25/14 1439    Symptoms Pt state he is sore today    Currently in Pain? Yes   Pain Score 5              [facility] Adult PT Treatment/Exercise - 12/25/14 0001    Exercises   Exercises Knee/Hip   Knee/Hip Exercises: Standing   Lateral Step Up Right;10 reps;Step Height: 6"   Forward Step Up Right;10 reps;Step Height: 6"   Rocker Board 2  minutes   SLS with Vectors 3 x with 10" vector    Gait Training  B hip abduction 4#    Other Standing Knee Exercises 3D hip excursion x 5    Other Standing Knee Exercises tandem gt x 2 RT; retro gt x 2RT    Knee/Hip Exercises: Seated   Other Seated Knee Exercises 10 sit to stand B                PT Short Term Goals - 12/19/14 1519    PT SHORT TERM GOAL #1   Title Pt to be able to complete 7 sit to stand in 30 seconds to demonstrate improved power( norm for pt age is 11-17)   Status Achieved           PT Long Term Goals - 12/17/14 1842    PT LONG TERM GOAL #1   Title Pt strerngth to be improved one grade to allow pt to walk for 15 minutes without stopping   Status On-going   PT LONG TERM GOAL #2   Title Pt to be able to complete 11 sit to stand in 30 seconds for improved power   Status  On-going   PT LONG TERM GOAL #3   Status On-going               Plan - 12/25/14 1622    Clinical Impression Statement Pt therapy facilitated by therapist.  Pt is very unsure of himself when it comes to balance activities.  Noted decreased trunk rotation with walking    PT Next Visit Plan begin cone rotation on foam as well as lunging         Problem List Patient Active Problem List   Diagnosis Date Noted  . OA (osteoarthritis) of hip 04/20/2014  . Bladder neck contracture 04/20/2014  . Lumbago 07/05/2012  . Spondylosis with myelopathy, lumbar region 07/05/2012  . Degenerative arthritis of hip 09/22/2011  . Degenerative disc disease, lumbar 09/22/2011  . Acute on chronic renal failure 08/07/2011  . BPH (benign prostatic hyperplasia) 08/07/2011  . Prostate cancer 08/07/2011  . Hypertension 08/07/2011  . Diabetes mellitus type 2 with complications 48/18/5631  . Lumbar disc disease 08/07/2011    RUSSELL,CINDY PT 12/25/2014, 4:24 PM  [facility] [place], [place], [address] Phone: [phone]   Fax:   [phone]

## 2014-12-26 ENCOUNTER — Encounter (HOSPITAL_COMMUNITY): Payer: Medicare Other

## 2014-12-26 ENCOUNTER — Ambulatory Visit (HOSPITAL_COMMUNITY)
Admission: RE | Admit: 2014-12-26 | Discharge: 2014-12-26 | Disposition: A | Discharge status: Home / Self Care | Payer: Medicare Other | Source: Ambulatory Visit | Attending: Internal Medicine | Admitting: Internal Medicine

## 2014-12-26 DIAGNOSIS — R2689 Other abnormalities of gait and mobility: Secondary | ICD-10-CM

## 2014-12-26 DIAGNOSIS — R29898 Other symptoms and signs involving the musculoskeletal system: Secondary | ICD-10-CM

## 2014-12-26 DIAGNOSIS — Z5189 Encounter for other specified aftercare: Secondary | ICD-10-CM | POA: Diagnosis not present

## 2014-12-26 NOTE — Therapy (Signed)
Manuel [name], [place], [address] Phone: [phone]   Fax:  [phone]  Physical Therapy Treatment  Patient Details  Name: Manuel [name] MRN: [id number] Date of Birth: 1935/08/18  Encounter Date: 12/26/2014      PT End of Session - 12/26/14 1647    Visit Number 11   Number of Visits 16   Date for PT Re-Evaluation 01/16/15   Authorization Type UHC M   Authorization Time Period G-code updated visit #8   PT Start Time 1600   PT Stop Time 1645   PT Time Calculation (min) 45 min   Activity Tolerance Patient tolerated treatment well   Behavior During Therapy [facility] for tasks assessed/performed      Past Medical History  Diagnosis Date  . Hypertension   . Back pain   . Cancer     PROSTATE -- RADIATION SEED IMPLANT  . Diabetes mellitus     DX SINCE 1998    Past Surgical History  Procedure Laterality Date  . Back surgery    . Prostate surgery      with seed implants  . Cholecystectomy    . Vasectomy    . Eye surgery      CATARACT BOTH EYES 2014  . Transurethral resection of prostate  2012  . Total hip arthroplasty Right 04/20/2014    Procedure: RIGHT TOTAL HIP ARTHROPLASTY ANTERIOR APPROACH;  Surgeon: Gearlean Alf, MD;  Location: [facility];  Service: Orthopedics;  Laterality: Right;  . Cystoscopy with urethral dilatation N/A 04/20/2014    Procedure: CYSTOSCOPY WITH URETHRAL DILATATION AND FOLEY PLACEMENT;  Surgeon: Gearlean Alf, MD;  Location: [facility];  Service: Orthopedics;  Laterality: N/A;    There were no vitals taken for this visit.  Visit Diagnosis:  Poor balance  Leg weakness, bilateral      Subjective Assessment - 12/26/14 1612    Symptoms (p) Patient states increased back pain today, notes continued hip weakness on Lt and that he feels he has difficulty even helping himself tie his shoes.    Currently in Pain? (p) Yes   Pain Score (p) 5    Pain Location (p) Back            [facility] Adult  PT Treatment/Exercise - 12/26/14 0001    Knee/Hip Exercises: Stretches   Active Hamstring Stretch 1 rep;30 seconds   Active Hamstring Stretch Limitations 18" stool   Quad Stretch 1 rep;30 seconds   Quad Stretch Limitations manual   Piriformis Stretch 4 reps;20 seconds   Piriformis Stretch Limitations sterapist assisted   Gastroc Stretch 2 reps;30 seconds   Gastroc Stretch Limitations Slantboard   Knee/Hip Exercises: Standing   Heel Raises 20 reps   Forward Lunges Both;15 reps   Forward Lunges Limitations to floor   Functional Squat 10 reps   Functional Squat Limitations sit to stand.    Other Standing Knee Exercises 3D hip excursion x 5    Other Standing Knee Exercises standing cone rotation on airex   Knee/Hip Exercises: Supine   Bridges 5 reps   Manual Therapy   Manual Therapy Joint mobilization   Joint Mobilization femoral pelvic inerior andlateral glides to increase hip flexion and external/internal rotation.            PT Short Term Goals - 12/19/14 1519    PT SHORT TERM GOAL #1   Title Pt to be able to complete 7 sit to stand in 30 seconds to  demonstrate improved power( norm for pt age is 73-17)   Status Achieved           PT Long Term Goals - 12/17/14 1842    PT LONG TERM GOAL #1   Title Pt strerngth to be improved one grade to allow pt to walk for 15 minutes without stopping   Status On-going   PT LONG TERM GOAL #2   Title Pt to be able to complete 11 sit to stand in 30 seconds for improved power   Status On-going   PT LONG TERM GOAL #3   Status On-going               Plan - 12/26/14 1647    Clinical Impression Statement Patient dispalys significant hip mobility limitation resulting in difficulty sitting with ankle crossed over knee to tie shoes. Patient demosntrated immediate thoguh still imiited hip mobility improvement following Rt and Lt hip joint mobilizations and stretches resultign in improved hip flexion and external rotation.  patient  demosntrated good lungign technnique and distance with Rt LE thoguh limited on Let.    PT Next Visit Plan Cotninue to progress hip mobility as patient would really like to independently  put on his shoes, and continue to progress lunging and single elg balance activities.         Problem List Patient Active Problem List   Diagnosis Date Noted  . OA (osteoarthritis) of hip 04/20/2014  . Bladder neck contracture 04/20/2014  . Lumbago 07/05/2012  . Spondylosis with myelopathy, lumbar region 07/05/2012  . Degenerative arthritis of hip 09/22/2011  . Degenerative disc disease, lumbar 09/22/2011  . Acute on chronic renal failure 08/07/2011  . BPH (benign prostatic hyperplasia) 08/07/2011  . Prostate cancer 08/07/2011  . Hypertension 08/07/2011  . Diabetes mellitus type 2 with complications 70/48/8891  . Lumbar disc disease 08/07/2011   Manuel [name] PT DPT [facility] [place], [place], [address] Phone: [phone]   Fax:  [phone]

## 2014-12-31 ENCOUNTER — Ambulatory Visit (HOSPITAL_COMMUNITY)
Admission: RE | Admit: 2014-12-31 | Discharge: 2014-12-31 | Disposition: A | Discharge status: Home / Self Care | Payer: Medicare Other | Source: Ambulatory Visit | Attending: Internal Medicine | Admitting: Internal Medicine

## 2014-12-31 DIAGNOSIS — M6281 Muscle weakness (generalized): Secondary | ICD-10-CM | POA: Diagnosis not present

## 2014-12-31 DIAGNOSIS — R2689 Other abnormalities of gait and mobility: Secondary | ICD-10-CM

## 2014-12-31 DIAGNOSIS — Z5189 Encounter for other specified aftercare: Secondary | ICD-10-CM | POA: Diagnosis not present

## 2014-12-31 DIAGNOSIS — R262 Difficulty in walking, not elsewhere classified: Secondary | ICD-10-CM | POA: Diagnosis not present

## 2014-12-31 DIAGNOSIS — R29898 Other symptoms and signs involving the musculoskeletal system: Secondary | ICD-10-CM

## 2014-12-31 NOTE — Therapy (Signed)
[facility] [place], [place], [address] Phone: [phone]   Fax:  [phone]  Physical Therapy Treatment  Patient Details  Name: Manuel [name] MRN: [id number] Date of Birth: 1935-01-22  Encounter Date: 12/31/2014      PT End of Session - 12/31/14 1723    Visit Number 12   Number of Visits 16   Date for PT Re-Evaluation 01/16/15   Authorization Type UHC M   Authorization Time Period G-code updated visit #8   Authorization - Visit Number 12   Authorization - Number of Visits 16   PT Start Time 1600   PT Stop Time 1645   PT Time Calculation (min) 45 min   Activity Tolerance Patient tolerated treatment well   Behavior During Therapy [facility] for tasks assessed/performed      Past Medical History  Diagnosis Date  . Hypertension   . Back pain   . Cancer     PROSTATE -- RADIATION SEED IMPLANT  . Diabetes mellitus     DX SINCE 1998    Past Surgical History  Procedure Laterality Date  . Back surgery    . Prostate surgery      with seed implants  . Cholecystectomy    . Vasectomy    . Eye surgery      CATARACT BOTH EYES 2014  . Transurethral resection of prostate  2012  . Total hip arthroplasty Right 04/20/2014    Procedure: RIGHT TOTAL HIP ARTHROPLASTY ANTERIOR APPROACH;  Surgeon: Gearlean Alf, MD;  Location: [facility];  Service: Orthopedics;  Laterality: Right;  . Cystoscopy with urethral dilatation N/A 04/20/2014    Procedure: CYSTOSCOPY WITH URETHRAL DILATATION AND FOLEY PLACEMENT;  Surgeon: Gearlean Alf, MD;  Location: [facility];  Service: Orthopedics;  Laterality: N/A;    There were no vitals taken for this visit.  Visit Diagnosis:  Poor balance  Leg weakness, bilateral      Subjective Assessment - 12/31/14 1508    Symptoms Pt states he was very sore from last treatment as he did some new activities   Pain Score 4    Pain Location Hip   Pain Orientation Right                    [facility] Adult PT  Treatment/Exercise - 12/31/14 0001    Knee/Hip Exercises: Stretches   Active Hamstring Stretch 1 rep;30 seconds   Active Hamstring Stretch Limitations 14" box   Gastroc Stretch 1 rep;60 seconds   Gastroc Stretch Limitations Slantboard   Knee/Hip Exercises: Standing   Heel Raises 20 reps   Heel Raises Limitations with squats using yellow ball    Forward Lunges Both;15 reps   Forward Lunges Limitations to floor   Forward Step Up Right;10 reps;Step Height: 6"   Functional Squat Limitations sit to stand.    Rocker Board 2 minutes   SLS with Vectors 5x with 3" vector    Other Standing Knee Exercises tandem fwd and retro 2RT, heelwalk / toewalk 1RT each   Knee/Hip Exercises: Seated   Other Seated Knee Exercises 10 sit to stand B                   PT Short Term Goals - 12/19/14 1519    PT SHORT TERM GOAL #1   Title Pt to be able to complete 7 sit to stand in 30 seconds to demonstrate improved power( norm for pt age is 11-17)  Status Achieved           PT Long Term Goals - 12/17/14 1842    PT LONG TERM GOAL #1   Title Pt strerngth to be improved one grade to allow pt to walk for 15 minutes without stopping   Status On-going   PT LONG TERM GOAL #2   Title Pt to be able to complete 11 sit to stand in 30 seconds for improved power   Status On-going   PT LONG TERM GOAL #3   Status On-going               Plan - 12/31/14 1723    Clinical Impression Statement PT with noted antalgia today as states his pain has been increased since last visit.  Focused on stretching and hip mobilty.  Manual requrested to be held as this increased his pain.   Added heel and toe walk to challenge stabiliy.  Most instability with heel walking.   PT Next Visit Plan Continue to progress hip mobility as patient would really like to independently  put on his shoes, and continue to progress lunging and single leg balance activities.         Problem List Patient Active Problem List    Diagnosis Date Noted  . OA (osteoarthritis) of hip 04/20/2014  . Bladder neck contracture 04/20/2014  . Lumbago 07/05/2012  . Spondylosis with myelopathy, lumbar region 07/05/2012  . Degenerative arthritis of hip 09/22/2011  . Degenerative disc disease, lumbar 09/22/2011  . Acute on chronic renal failure 08/07/2011  . BPH (benign prostatic hyperplasia) 08/07/2011  . Prostate cancer 08/07/2011  . Hypertension 08/07/2011  . Diabetes mellitus type 2 with complications 88/82/8003  . Lumbar disc disease 08/07/2011    Teena Irani, PTA/CLT [phone] 12/31/2014, 5:26 PM  [facility] [address] [place], [place], [address] Phone: [phone]   Fax:  [phone]

## 2015-01-01 ENCOUNTER — Encounter (HOSPITAL_COMMUNITY): Payer: Self-pay | Admitting: Physical Therapy

## 2015-01-02 ENCOUNTER — Ambulatory Visit (HOSPITAL_COMMUNITY)
Admission: RE | Admit: 2015-01-02 | Discharge: 2015-01-02 | Disposition: A | Discharge status: Home / Self Care | Payer: Medicare Other | Source: Ambulatory Visit | Attending: Internal Medicine | Admitting: Internal Medicine

## 2015-01-02 DIAGNOSIS — R2689 Other abnormalities of gait and mobility: Secondary | ICD-10-CM

## 2015-01-02 DIAGNOSIS — R29898 Other symptoms and signs involving the musculoskeletal system: Secondary | ICD-10-CM

## 2015-01-02 DIAGNOSIS — Z5189 Encounter for other specified aftercare: Secondary | ICD-10-CM | POA: Diagnosis not present

## 2015-01-02 NOTE — Therapy (Signed)
[facility] [place], [place], [address] Phone: [phone]   Fax:  [phone]  Physical Therapy Treatment  Patient Details  Name: Manuel [name] MRN: [id number] Date of Birth: 1935/02/26  Encounter Date: 01/02/2015      PT End of Session - 01/02/15 1511    Visit Number 13   Number of Visits 16   Date for PT Re-Evaluation 01/16/15   Authorization Type UHC M   Authorization Time Period G-code updated visit #8   Authorization - Visit Number 13   Authorization - Number of Visits 16   PT Start Time 1749   PT Stop Time 1509   PT Time Calculation (min) 49 min   Activity Tolerance Patient tolerated treatment well;Patient limited by pain   Behavior During Therapy [facility] for tasks assessed/performed      Past Medical History  Diagnosis Date  . Hypertension   . Back pain   . Cancer     PROSTATE -- RADIATION SEED IMPLANT  . Diabetes mellitus     DX SINCE 1998    Past Surgical History  Procedure Laterality Date  . Back surgery    . Prostate surgery      with seed implants  . Cholecystectomy    . Vasectomy    . Eye surgery      CATARACT BOTH EYES 2014  . Transurethral resection of prostate  2012  . Total hip arthroplasty Right 04/20/2014    Procedure: RIGHT TOTAL HIP ARTHROPLASTY ANTERIOR APPROACH;  Surgeon: Gearlean Alf, MD;  Location: [facility];  Service: Orthopedics;  Laterality: Right;  . Cystoscopy with urethral dilatation N/A 04/20/2014    Procedure: CYSTOSCOPY WITH URETHRAL DILATATION AND FOLEY PLACEMENT;  Surgeon: Gearlean Alf, MD;  Location: [facility];  Service: Orthopedics;  Laterality: N/A;    There were no vitals taken for this visit.  Visit Diagnosis:  Poor balance  Leg weakness, bilateral      Subjective Assessment - 01/02/15 1424    Currently in Pain? No/denies   Pain Score 0-No pain          [facility] PT Assessment - 01/02/15 1419    Assessment   Medical Diagnosis Rt hip replacement   Onset Date  04/20/14   Next MD Visit [facility] 5 months   Flexibility   Quadriceps Rt 3 1/2in, Lt 4in to glute  was Rt 5 3/4in, Lt 6 in to [facility] Adult PT Treatment/Exercise - 01/02/15 1419    Exercises   Exercises Knee/Hip   Knee/Hip Exercises: Stretches   Active Hamstring Stretch 1 rep;30 seconds   Active Hamstring Stretch Limitations 14" box   Quad Stretch 2 reps;30 seconds   Quad Stretch Limitations Prone with rope   Gastroc Stretch 1 rep;60 seconds   Gastroc Stretch Limitations Slantboard   Knee/Hip Exercises: Standing   Other Standing Knee Exercises sumo walks c green therband x65ft RT   Knee/Hip Exercises: Seated   Other Seated Knee Exercises STS 2x5, no UE   Knee/Hip Exercises: Supine   Bridges 10 reps   Bridges Limitations 3 second holds at peak of hip extension   Straight Leg Raises 10 reps;2 sets;Both   Straight Leg Raises Limitations one set with 1# B, one set with 0# B   Knee/Hip Exercises: Sidelying   Hip ABduction 2 sets;10 reps;Both;Limitations   Hip ABduction Limitations 1#  Knee/Hip Exercises: Prone   Hamstring Curl 2 sets;10 reps   Hamstring Curl Limitations 3#   Hip Extension 2 sets;10 reps;Both   Hip Extension Limitations 1#                  PT Short Term Goals - 12/19/14 1519    PT SHORT TERM GOAL #1   Title Pt to be able to complete 7 sit to stand in 30 seconds to demonstrate improved power( norm for pt age is 11-17)   Status Achieved           PT Long Term Goals - 12/17/14 1842    PT LONG TERM GOAL #1   Title Pt strerngth to be improved one grade to allow pt to walk for 15 minutes without stopping   Status On-going   PT LONG TERM GOAL #2   Title Pt to be able to complete 11 sit to stand in 30 seconds for improved power   Status On-going   PT LONG TERM GOAL #3   Status On-going               Plan - 01/02/15 1512    Clinical Impression Statement Pt continued to report increased complaints of muscle  soreness today, primarily in quad musculature, with noted difficulties with STS exercise secondary to soreness.   Exercises transitioned to mat secondary to soreness.  Quad length measurement updated today, with improvements noted in Rt and Lt LE.     Pt will benefit from skilled therapeutic intervention in order to improve on the following deficits Decreased activity tolerance;Decreased balance;Decreased strength;Difficulty walking;Pain   Rehab Potential Good   PT Frequency 2x / week   PT Duration 4 weeks   PT Treatment/Interventions Gait training;Therapeutic activities;Therapeutic exercise;Balance training;Patient/family education   PT Next Visit Plan Continue piriformis stretch to assist with pt being able to (I) tie shoes.          Problem List Patient Active Problem List   Diagnosis Date Noted  . OA (osteoarthritis) of hip 04/20/2014  . Bladder neck contracture 04/20/2014  . Lumbago 07/05/2012  . Spondylosis with myelopathy, lumbar region 07/05/2012  . Degenerative arthritis of hip 09/22/2011  . Degenerative disc disease, lumbar 09/22/2011  . Acute on chronic renal failure 08/07/2011  . BPH (benign prostatic hyperplasia) 08/07/2011  . Prostate cancer 08/07/2011  . Hypertension 08/07/2011  . Diabetes mellitus type 2 with complications 01/05/3234  . Lumbar disc disease 08/07/2011   Lonna Cobb, DPT [phone]  [facility] [address] [place], [place], [address] Phone: [phone]   Fax:  [phone]

## 2015-01-07 ENCOUNTER — Ambulatory Visit (HOSPITAL_COMMUNITY)
Admission: RE | Admit: 2015-01-07 | Discharge: 2015-01-07 | Disposition: A | Discharge status: Home / Self Care | Payer: Medicare Other | Source: Ambulatory Visit | Attending: Internal Medicine | Admitting: Internal Medicine

## 2015-01-07 DIAGNOSIS — Z5189 Encounter for other specified aftercare: Secondary | ICD-10-CM | POA: Diagnosis not present

## 2015-01-07 DIAGNOSIS — R2689 Other abnormalities of gait and mobility: Secondary | ICD-10-CM

## 2015-01-07 DIAGNOSIS — R29898 Other symptoms and signs involving the musculoskeletal system: Secondary | ICD-10-CM

## 2015-01-07 NOTE — Therapy (Signed)
[facility] [place], [place], [address] Phone: [phone]   Fax:  [phone]  Physical Therapy Treatment  Patient Details  Name: Manuel [name] MRN: [id number] Date of Birth: Feb 21, 1935 Referring Provider:  Asencion Noble, MD  Encounter Date: 01/07/2015      PT End of Session - 01/07/15 1730    Visit Number 14   Number of Visits 16   Date for PT Re-Evaluation 01/16/15   Authorization Type UHC M   Authorization Time Period G-code updated visit #8   Authorization - Visit Number 14   Authorization - Number of Visits 16   PT Start Time 1440   PT Stop Time 1524   PT Time Calculation (min) 44 min   Activity Tolerance Patient tolerated treatment well;Patient limited by pain   Behavior During Therapy [facility] for tasks assessed/performed      Past Medical History  Diagnosis Date  . Hypertension   . Back pain   . Cancer     PROSTATE -- RADIATION SEED IMPLANT  . Diabetes mellitus     DX SINCE 1998    Past Surgical History  Procedure Laterality Date  . Back surgery    . Prostate surgery      with seed implants  . Cholecystectomy    . Vasectomy    . Eye surgery      CATARACT BOTH EYES 2014  . Transurethral resection of prostate  2012  . Total hip arthroplasty Right 04/20/2014    Procedure: RIGHT TOTAL HIP ARTHROPLASTY ANTERIOR APPROACH;  Surgeon: Gearlean Alf, MD;  Location: [place];  Service: Orthopedics;  Laterality: Right;  . Cystoscopy with urethral dilatation N/A 04/20/2014    Procedure: CYSTOSCOPY WITH URETHRAL DILATATION AND FOLEY PLACEMENT;  Surgeon: Gearlean Alf, MD;  Location: [place];  Service: Orthopedics;  Laterality: N/A;    There were no vitals taken for this visit.  Visit Diagnosis:  Poor balance  Leg weakness, bilateral      Subjective Assessment - 01/07/15 1442    Symptoms Pt states his lower back hurt over the weekend at 8/10 without any known reason.  States he woke with the pain.   Currently  with 4/10 pain in his lower back.  States he's having pani into his bilateral quadricep muscles.    Currently in Pain? Yes   Pain Score 4    Pain Location Back   Pain Orientation Lower                    [facility] Adult PT Treatment/Exercise - 01/07/15 1446    Knee/Hip Exercises: Stretches   Active Hamstring Stretch 1 rep;30 seconds   Active Hamstring Stretch Limitations 14" box   Quad Stretch 2 reps;30 seconds   Quad Stretch Limitations passive in prone   Piriformis Stretch 4 reps;20 seconds   Piriformis Stretch Limitations therapist assisted   Gastroc Stretch 1 rep;60 seconds   Gastroc Stretch Limitations Slantboard   Knee/Hip Exercises: Standing   Heel Raises 20 reps   Heel Raises Limitations with squats using blue ball    Forward Lunges Both;15 reps   Forward Lunges Limitations to floor   Knee/Hip Exercises: Prone   Other Prone Exercises POE 5 minutes   Other Prone Exercises press ups 5 reps   Manual Therapy   Manual Therapy Massage   Joint Mobilization lumbar in prone  PT Short Term Goals - 12/19/14 1519    PT SHORT TERM GOAL #1   Title Pt to be able to complete 7 sit to stand in 30 seconds to demonstrate improved power( norm for pt age is 11-17)   Status Achieved           PT Long Term Goals - 12/17/14 1842    PT LONG TERM GOAL #1   Title Pt strerngth to be improved one grade to allow pt to walk for 15 minutes without stopping   Status On-going   PT LONG TERM GOAL #2   Title Pt to be able to complete 11 sit to stand in 30 seconds for improved power   Status On-going   PT LONG TERM GOAL #3   Status On-going               Plan - 01/07/15 1730    Clinical Impression Statement Pt with increased Lumbar pain today and became increasinly worse with standing therex.  Added POE and press ups as well as manual to lumbar region to decrease spasm and pain.  Pt without pain following manual techniques.    PT Next Visit Plan  Continue piriformis stretch to assist with pt being able to (I) tie shoes.  Assess pain next visit.         Problem List Patient Active Problem List   Diagnosis Date Noted  . OA (osteoarthritis) of hip 04/20/2014  . Bladder neck contracture 04/20/2014  . Lumbago 07/05/2012  . Spondylosis with myelopathy, lumbar region 07/05/2012  . Degenerative arthritis of hip 09/22/2011  . Degenerative disc disease, lumbar 09/22/2011  . Acute on chronic renal failure 08/07/2011  . BPH (benign prostatic hyperplasia) 08/07/2011  . Prostate cancer 08/07/2011  . Hypertension 08/07/2011  . Diabetes mellitus type 2 with complications 74/25/9563  . Lumbar disc disease 08/07/2011    Teena Irani, PTA/CLT [phone] 01/07/2015, 5:33 PM  [facility] [address] [place], [place], [address] Phone: [phone]   Fax:  [phone]

## 2015-01-09 ENCOUNTER — Ambulatory Visit (HOSPITAL_COMMUNITY)
Admission: RE | Admit: 2015-01-09 | Discharge: 2015-01-09 | Disposition: A | Discharge status: Home / Self Care | Payer: Medicare Other | Source: Ambulatory Visit | Attending: Physician Assistant | Admitting: Physician Assistant

## 2015-01-09 DIAGNOSIS — R2689 Other abnormalities of gait and mobility: Secondary | ICD-10-CM

## 2015-01-09 DIAGNOSIS — Z5189 Encounter for other specified aftercare: Secondary | ICD-10-CM | POA: Diagnosis not present

## 2015-01-09 DIAGNOSIS — R29898 Other symptoms and signs involving the musculoskeletal system: Secondary | ICD-10-CM

## 2015-01-09 NOTE — Therapy (Signed)
[place], [place], [address] Phone: [phone]   Fax:  [phone]  Physical Therapy Treatment  Patient Details  Name: Manuel [name] MRN: [id number] Date of Birth: 12-Sep-1935 Referring Provider:  Krystal Eaton, PA-C  Encounter Date: 01/09/2015      PT End of Session - 01/09/15 1627    Visit Number 15   Number of Visits 16   Date for PT Re-Evaluation 01/16/15   Authorization Type [employer]   Authorization Time Period G-code updated visit #8   Authorization - Visit Number 15   Authorization - Number of Visits 16   PT Start Time 3500   PT Stop Time 1604   PT Time Calculation (min) 48 min   Activity Tolerance Patient tolerated treatment well;Patient limited by pain   Behavior During Therapy [facility] for tasks assessed/performed      Past Medical History  Diagnosis Date  . Hypertension   . Back pain   . Cancer     PROSTATE -- RADIATION SEED IMPLANT  . Diabetes mellitus     DX SINCE 1998    Past Surgical History  Procedure Laterality Date  . Back surgery    . Prostate surgery      with seed implants  . Cholecystectomy    . Vasectomy    . Eye surgery      CATARACT BOTH EYES 2014  . Transurethral resection of prostate  2012  . Total hip arthroplasty Right 04/20/2014    Procedure: RIGHT TOTAL HIP ARTHROPLASTY ANTERIOR APPROACH;  Surgeon: Gearlean Alf, MD;  Location: [facility];  Service: Orthopedics;  Laterality: Right;  . Cystoscopy with urethral dilatation N/A 04/20/2014    Procedure: CYSTOSCOPY WITH URETHRAL DILATATION AND FOLEY PLACEMENT;  Surgeon: Gearlean Alf, MD;  Location: [facility];  Service: Orthopedics;  Laterality: N/A;    There were no vitals taken for this visit.  Visit Diagnosis:  Poor balance  Leg weakness, bilateral      Subjective Assessment - 01/09/15 1519    Symptoms Pt states his back continues to hurt as it did last visit.  8/10.  states if he sits anytime and tries to get  up he has increased pain in his hip flexor/quad region bilaterally.   Currently in Pain? Yes   Pain Score 8    Pain Location Back                    [facility] Adult PT Treatment/Exercise - 01/09/15 1627    Knee/Hip Exercises: Stretches   Active Hamstring Stretch 1 rep;30 seconds   Active Hamstring Stretch Limitations 14" box   Quad Stretch 3 reps;30 seconds   Quad Stretch Limitations passive in prone   Gastroc Stretch 1 rep;60 seconds   Gastroc Stretch Limitations Slantboard   Knee/Hip Exercises: Standing   Heel Raises 10 reps   Heel Raises Limitations with squats using blue ball    Forward Lunges Both;15 reps   Forward Lunges Limitations to floor   SLS with Vectors 5x with 3" vector    Knee/Hip Exercises: Prone   Other Prone Exercises POE 5 minutes   Manual Therapy   Manual Therapy Massage                  PT Short Term Goals - 12/19/14 1519    PT SHORT TERM GOAL #1   Title Pt to be able to complete 7 sit to stand in 30 seconds  to demonstrate improved power( norm for pt age is 61-17)   Status Achieved           PT Long Term Goals - 12/17/14 1842    PT LONG TERM GOAL #1   Title Pt strerngth to be improved one grade to allow pt to walk for 15 minutes without stopping   Status On-going   PT LONG TERM GOAL #2   Title Pt to be able to complete 11 sit to stand in 30 seconds for improved power   Status On-going   PT LONG TERM GOAL #3   Status On-going               Plan - 01/09/15 1629    Clinical Impression Statement Pt with reported relief following last session, however unable to get relief following today's session.  Pt with decreased pain with POE, however increased with press ups.  No spasms or tightness palpated with massage today.  Unable to complete greater than 10 squats with ball due to increasing pain.  Pt encouraged to complete HEP as he admits to not being compliant with this.  Unsure if  patient needs to return to MD for further  recommendations or if therapy POC needs to be modified.    PT Next Visit Plan Re-evaluate next visti.        Problem List Patient Active Problem List   Diagnosis Date Noted  . OA (osteoarthritis) of hip 04/20/2014  . Bladder neck contracture 04/20/2014  . Lumbago 07/05/2012  . Spondylosis with myelopathy, lumbar region 07/05/2012  . Degenerative arthritis of hip 09/22/2011  . Degenerative disc disease, lumbar 09/22/2011  . Acute on chronic renal failure 08/07/2011  . BPH (benign prostatic hyperplasia) 08/07/2011  . Prostate cancer 08/07/2011  . Hypertension 08/07/2011  . Diabetes mellitus type 2 with complications 09/32/6712  . Lumbar disc disease 08/07/2011    Teena Irani, PTA/CLT [phone] 01/09/2015, 4:33 PM  [facility] [address] [place], [place], [address] Phone: [phone]   Fax:  [phone]

## 2015-01-14 ENCOUNTER — Ambulatory Visit (HOSPITAL_COMMUNITY)
Admission: RE | Admit: 2015-01-14 | Discharge: 2015-01-14 | Disposition: A | Discharge status: Home / Self Care | Payer: Medicare Other | Source: Ambulatory Visit | Attending: Orthopedic Surgery | Admitting: Orthopedic Surgery

## 2015-01-14 DIAGNOSIS — Z5189 Encounter for other specified aftercare: Secondary | ICD-10-CM | POA: Diagnosis not present

## 2015-01-14 DIAGNOSIS — R29898 Other symptoms and signs involving the musculoskeletal system: Secondary | ICD-10-CM

## 2015-01-14 DIAGNOSIS — R2689 Other abnormalities of gait and mobility: Secondary | ICD-10-CM

## 2015-01-14 NOTE — Therapy (Signed)
[place], [place], [address] Phone: [phone]   Fax:  [phone]  Physical Therapy Treatment / Discharge Visit  Patient Details  Name: Manuel [name] MRN: [id number] Date of Birth: 1935-09-18 Referring Provider:  Gearlean Alf, MD  Encounter Date: 01/14/2015      PT End of Session - 01/14/15 1557    Visit Number 16   Number of Visits 16   PT Start Time 1512   PT Stop Time 1552   PT Time Calculation (min) 40 min   Activity Tolerance Patient tolerated treatment well   Behavior During Therapy [facility] for tasks assessed/performed      Past Medical History  Diagnosis Date  . Hypertension   . Back pain   . Cancer     PROSTATE -- RADIATION SEED IMPLANT  . Diabetes mellitus     DX SINCE 1998    Past Surgical History  Procedure Laterality Date  . Back surgery    . Prostate surgery      with seed implants  . Cholecystectomy    . Vasectomy    . Eye surgery      CATARACT BOTH EYES 2014  . Transurethral resection of prostate  2012  . Total hip arthroplasty Right 04/20/2014    Procedure: RIGHT TOTAL HIP ARTHROPLASTY ANTERIOR APPROACH;  Surgeon: Gearlean Alf, MD;  Location: [facility];  Service: Orthopedics;  Laterality: Right;  . Cystoscopy with urethral dilatation N/A 04/20/2014    Procedure: CYSTOSCOPY WITH URETHRAL DILATATION AND FOLEY PLACEMENT;  Surgeon: Gearlean Alf, MD;  Location: [facility];  Service: Orthopedics;  Laterality: N/A;    There were no vitals taken for this visit.  Visit Diagnosis:  Poor balance  Leg weakness, bilateral      Subjective Assessment - 01/14/15 1515    How long can you walk comfortably? Able to amb ~30 minutes when grocery shopping (was 5 minutes)   Pain Score 2    Pain Location Leg  Muscle soreness in (B) hamstrings   Pain Orientation Right;Left          [facility] PT Assessment - 01/14/15 1518    Assessment   Medical Diagnosis Rt hip replacement   Onset Date 04/20/14    Observation/Other Assessments   Focus on Therapeutic Outcomes (FOTO)  FOTO 37" (was 39%)   Functional Tests   Functional tests Sit to Stand   Sit to Stand   Comments 9 1/2 in 30 seconds  was 7   Strength   Right Hip Flexion 4+/5  was 4-/5   Right Hip Extension 4/5  was 3+/5   Right Hip ABduction 4+/5  was 3+/5   Left Hip Flexion 4/5  was 4-/t   Left Hip Extension 4/5  was 4-/5   Left Hip ABduction 4/5  was 3+/5   Right Knee Extension 5/5  was 4/5   Left Knee Extension 5/5  was 5/5   Right Ankle Dorsiflexion 4+/5  was 4/5   Left Ankle Dorsiflexion 4+/5  was 4/5   Flexibility   Soft Tissue Assessment /Muscle Lenght yes   Hamstrings Rt 78 degrees, Lt 75 degrees  was Rt 79 degrees, Lt 75 degrees   Quadriceps Rt 3in, Lt 3in to glute  was Rt 3 1/2 in, Lt 4 in to glute   Static Standing Balance   Static Standing - Balance Support No upper extremity supported   Static Standing Balance -  Activities  Single Leg  Stance - Right Leg;Single Leg Stance - Left Leg   Static Standing - Comment/# of Minutes Rt 11, Lt 29   Standardized Balance Assessment   Standardized Balance Assessment Berg Balance Test   Berg Balance Test   Sit to Stand Able to stand without using hands and stabilize independently   Standing Unsupported Able to stand safely 2 minutes   Sitting with Back Unsupported but Feet Supported on Floor or Stool Able to sit safely and securely 2 minutes   Stand to Sit Sits safely with minimal use of hands   Transfers Able to transfer safely, minor use of hands   Standing Unsupported with Eyes Closed Able to stand 10 seconds safely   Standing Ubsupported with Feet Together Able to place feet together independently and stand 1 minute safely   From Standing, Reach Forward with Outstretched Arm Can reach forward >12 cm safely (5")   From Standing Position, Pick up Object from Floor Unable to pick up shoe, but reaches 2-5 cm (1-2") from shoe and balances independently   From  Standing Position, Turn to Look Behind Over each Shoulder Looks behind from both sides and weight shifts well   Turn 360 Degrees Able to turn 360 degrees safely in 4 seconds or less   Standing Unsupported, Alternately Place Feet on Step/Stool Able to stand independently and safely and complete 8 steps in 20 seconds   Standing Unsupported, One Foot in Front Able to plae foot ahead of the other independently and hold 30 seconds   Standing on One Leg Able to lift leg independently and hold equal to or more than 3 seconds   Total Score 50                  [facility] Adult PT Treatment/Exercise - 01/14/15 1513    Exercises   Exercises Knee/Hip   Knee/Hip Exercises: Stretches   Active Hamstring Stretch 1 rep;30 seconds   Active Hamstring Stretch Limitations 14" box   Quad Stretch 3 reps;30 seconds   Quad Stretch Limitations Prone with rope   Gastroc Stretch 2 reps;30 seconds   Gastroc Stretch Limitations Slantboard   Knee/Hip Exercises: Standing   Forward Lunges 10 reps;Both   Forward Lunges Limitations reach to knee height   SLS Rt 11", Lt 29"                  PT Short Term Goals - 01/14/15 1542    PT SHORT TERM GOAL #1   Title Pt to be able to complete 7 sit to stand in 30 seconds to demonstrate improved power( norm for pt age is 11-17)   Baseline 01/14/15 : 9 1/2   Status Achieved   PT SHORT TERM GOAL #2   Title Pt to be walking for ten minutes at least 3 times a week    Status Achieved   PT SHORT TERM GOAL #3   Title Pt to be I with HEP   Status Achieved           PT Long Term Goals - 01/14/15 1542    PT LONG TERM GOAL #1   Title Pt strerngth to be improved one grade to allow pt to walk for 15 minutes without stopping   Status Achieved   PT LONG TERM GOAL #2   Title Pt to be able to complete 11 sit to stand in 30 seconds for improved power   Baseline 01/14/15 : 9 1/2   Status Partially Met   PT  LONG TERM GOAL #3   Title Pt SLS to be 15 sec B  to reduce  risk of falling    Baseline Jan 15, 2015 : Lt 29 seconds, Rt 11 seconds    Status Partially Met               Plan - 2015/01/15 1558    Clinical Impression Statement Treatment session completed, with measurements updated for d/c today.  Pt has currently met all of his STG, and has met 1/3 LTG.  Noted improved flexibility and strength to function levels, along with improved balance on Berg Balance to 50.  Pt educated on importance of continued performance of HEP to maintain and improve changes.  Pt reports understanding of importance of HEP.  Pt to be d/c to (I) HEP.     Pt will benefit from skilled therapeutic intervention in order to improve on the following deficits Decreased activity tolerance;Decreased balance;Decreased strength;Difficulty walking;Pain   Rehab Potential Good   PT Frequency 2x / week   PT Duration 4 weeks   PT Treatment/Interventions Gait training;Therapeutic activities;Therapeutic exercise;Balance training;Patient/family education   PT Next Visit Plan D/C to (I) HEP.           G-Codes - 01-15-2015 1559    Functional Assessment Tool Used FOTO   Functional Limitation Mobility: Walking and moving around   Mobility: Walking and Moving Around Current Status [phone]) At least 20 percent but less than 40 percent impaired, limited or restricted   Mobility: Walking and Moving Around Goal Status [phone]) At least 20 percent but less than 40 percent impaired, limited or restricted   Mobility: Walking and Moving Around Discharge Status [phone]) At least 20 percent but less than 40 percent impaired, limited or restricted      Problem List Patient Active Problem List   Diagnosis Date Noted  . OA (osteoarthritis) of hip 04/20/2014  . Bladder neck contracture 04/20/2014  . Lumbago 07/05/2012  . Spondylosis with myelopathy, lumbar region 07/05/2012  . Degenerative arthritis of hip 09/22/2011  . Degenerative disc disease, lumbar 09/22/2011  . Acute on chronic renal failure  08/07/2011  . BPH (benign prostatic hyperplasia) 08/07/2011  . Prostate cancer 08/07/2011  . Hypertension 08/07/2011  . Diabetes mellitus type 2 with complications 76/19/1550  . Lumbar disc disease 08/07/2011    Lonna Cobb, DPT [phone]   01-15-2015, 4:00 PM  [facility] [place], [place], [address] Phone: [phone]   Fax:  [phone]   PHYSICAL THERAPY DISCHARGE SUMMARY  Visits from Start of Care: 16  Current functional level related to goals / functional outcomes: See above   Remaining deficits: See above   Education / Equipment: HEP provided.   Plan: Patient agrees to discharge.  Patient goals were partially met. Patient is being discharged due to meeting the stated rehab goals.  ?????

## 2015-01-16 ENCOUNTER — Ambulatory Visit (HOSPITAL_COMMUNITY): Payer: Medicare Other | Admitting: Physical Therapy

## 2015-01-21 ENCOUNTER — Encounter (HOSPITAL_COMMUNITY): Payer: Medicare Other | Admitting: Physical Therapy

## 2015-01-22 ENCOUNTER — Ambulatory Visit (INDEPENDENT_AMBULATORY_CARE_PROVIDER_SITE_OTHER): Payer: Medicare Other | Admitting: Pulmonary Disease

## 2015-01-22 ENCOUNTER — Encounter: Payer: Self-pay | Admitting: Pulmonary Disease

## 2015-01-22 VITALS — BP 112/62 | HR 57 | Temp 97.0°F | Ht 73.0 in | Wt 246.2 lb

## 2015-01-22 DIAGNOSIS — G4733 Obstructive sleep apnea (adult) (pediatric): Secondary | ICD-10-CM | POA: Insufficient documentation

## 2015-01-22 NOTE — Assessment & Plan Note (Signed)
The patient has mild to moderate obstructive sleep apnea by his recent sleep study, and is very symptomatic both during the night and during the day. It is also affecting his bed partner's sleep, to the point that she has had to move to a different room. I have had a long discussion with him about sleep apnea, including its impact to his quality of life and cardiovascular health. I have outlined various approaches to treatment, including a trial of weight loss alone, dental appliance, and also C Pap. The patient feels that he is symptomatic enough that he would like to treat this aggressively while he is trying to work on weight loss. He is willing to give C Pap a try, and I will start him on the device with the automatic setting. I will set the patient up on cpap at a moderate pressure level to allow for desensitization, and will troubleshoot the device over the next 4-6weeks if needed.  The pt is to call me if having issues with tolerance.  Will then optimize the pressure once patient is able to wear cpap on a consistent basis.

## 2015-01-22 NOTE — Progress Notes (Signed)
Subjective:    Patient ID: Manuel [name], male    DOB: 06-29-1935, 80 y.o.   MRN: [id number]  HPI The patient is a 80 year old male who I've been asked to see for management of obstructive sleep apnea. He underwent a sleep study in December of last year, and was found to have mild to moderate OSA with an AHI of 17 events per hour. He was started on C Pap is part of a split night protocol, and found to have an optimal pressure of 8 cm of water. The patient has been told that he has loud snoring, as well as witnessed apneas by his bed partner. He has frequent awakenings at night, and does not feel rested in the mornings upon arising. He notes significant sleepiness during the day with any period of inactivity, and will fall asleep in his chair in the evenings while watching television. He also notes sleepiness with driving. The patient states that his weight is up 10 pounds over the last 2 years, and his Epworth score today is 17.   Sleep Questionnaire What time do you typically go to bed?( Between what hours) 10:30p 10:30p at 1401 on 01/22/15 by Inge Rise, CMA How long does it take you to fall asleep? 10-15 min 10-15 min at 1401 on 01/22/15 by Inge Rise, CMA How many times during the night do you wake up? 3 3 at 1401 on 01/22/15 by Inge Rise, [place] What time do you get out of bed to start your day? 0600 0600 at 1401 on 01/22/15 by Inge Rise, CMA Do you drive or operate heavy machinery in your occupation? No No at 1401 on 01/22/15 by Inge Rise, CMA How much has your weight changed (up or down) over the past two years? (In pounds) 10 lb (4.536 kg) 10 lb (4.536 kg) at 1401 on 01/22/15 by Inge Rise, CMA Have you ever had a sleep study before? Yes Yes at 1401 on 01/22/15 by Inge Rise, CMA If yes, location of study? [facility] at 1401 on 01/22/15 by Inge Rise, CMA If yes, date of study? 11/2014 11/2014 at 1401 on 01/22/15 by Inge Rise, CMA Do you currently  use CPAP? No No at 1401 on 01/22/15 by Inge Rise, CMA Do you wear oxygen at any time? No No at 1401 on 01/22/15 by Inge Rise, CMA   Review of Systems  Constitutional: Negative for fever and unexpected weight change.  HENT: Negative for congestion, dental problem, ear pain, nosebleeds, postnasal drip, rhinorrhea, sinus pressure, sneezing, sore throat and trouble swallowing.   Eyes: Negative for redness and itching.  Respiratory: Negative for cough, chest tightness, shortness of breath and wheezing.   Cardiovascular: Negative for palpitations and leg swelling.  Gastrointestinal: Negative for nausea and vomiting.  Genitourinary: Negative for dysuria.  Musculoskeletal: Negative for joint swelling.  Skin: Negative for rash.  Neurological: Negative for headaches.  Hematological: Does not bruise/bleed easily.  Psychiatric/Behavioral: Negative for dysphoric mood. The patient is not nervous/anxious.        Objective:   Physical Exam Constitutional:  Overweight male, no acute distress  HENT:  Nares patent without discharge, but deviated septum to right with narrowing.  Oropharynx without exudate, palate and uvula are moderately elongated.  Eyes:  Perrla, eomi, no scleral icterus  Neck:  No JVD, no TMG  Cardiovascular:  Normal rate, regular rhythm, no rubs or gallops.  3/6 sem  Intact distal pulses  Pulmonary :  Normal breath sounds, no stridor or respiratory distress   No rales, rhonchi, or wheezing  Abdominal:  Soft, nondistended, bowel sounds present.  No tenderness noted.   Musculoskeletal:  mild lower extremity edema noted.  Lymph Nodes:  No cervical lymphadenopathy noted  Skin:  No cyanosis noted  Neurologic:  Alert, appropriate, moves all 4 extremities without obvious deficit.         Assessment & Plan:

## 2015-01-22 NOTE — Patient Instructions (Signed)
Will start you on cpap.  Please call if you are having any tolerance issues. Work on weight loss.

## 2015-01-23 ENCOUNTER — Encounter (HOSPITAL_COMMUNITY): Payer: Medicare Other

## 2015-01-25 ENCOUNTER — Ambulatory Visit (INDEPENDENT_AMBULATORY_CARE_PROVIDER_SITE_OTHER): Payer: Medicare Other | Admitting: Urology

## 2015-01-25 DIAGNOSIS — R32 Unspecified urinary incontinence: Secondary | ICD-10-CM

## 2015-01-25 DIAGNOSIS — Z8546 Personal history of malignant neoplasm of prostate: Secondary | ICD-10-CM

## 2015-01-25 DIAGNOSIS — N5201 Erectile dysfunction due to arterial insufficiency: Secondary | ICD-10-CM

## 2015-03-19 ENCOUNTER — Encounter: Payer: Self-pay | Admitting: Pulmonary Disease

## 2015-03-19 ENCOUNTER — Ambulatory Visit (INDEPENDENT_AMBULATORY_CARE_PROVIDER_SITE_OTHER): Payer: Medicare Other | Admitting: Pulmonary Disease

## 2015-03-19 VITALS — BP 110/62 | HR 58 | Temp 97.0°F | Ht 74.0 in | Wt 251.4 lb

## 2015-03-19 DIAGNOSIS — G4733 Obstructive sleep apnea (adult) (pediatric): Secondary | ICD-10-CM

## 2015-03-19 NOTE — Patient Instructions (Signed)
Continue on cpap, and keep up with mask changes and supplies.  Let me know if you continue to have mask leak issues Work on weight loss.  followup with me again in 83mos

## 2015-03-19 NOTE — Progress Notes (Signed)
   Subjective:    Patient ID: Manuel [name], male    DOB: 1935-05-17, 80 y.o.   MRN: [id number]  HPI Patient comes in today for follow-up of his obstructive sleep apnea. He was started on C Pap last visit, and overall has done very well.  He has had some mask leak issues, but not too significant. His download shows good compliance, good control of his AHI, and no significant mask leak. He is not having breakthrough snoring according to his bed partner, and feels much more rested the following morning. Still having some afternoon sleepiness with inactivity, but is taking gabapentin 6 times a day.   Review of Systems  Constitutional: Negative for fever, chills, activity change, appetite change and unexpected weight change.  HENT: Negative for congestion, dental problem, ear pain, nosebleeds, postnasal drip, rhinorrhea, sinus pressure, sneezing, sore throat, trouble swallowing and voice change.   Eyes: Negative for redness, itching and visual disturbance.  Respiratory: Negative for cough, choking, chest tightness, shortness of breath and wheezing.   Cardiovascular: Negative for chest pain, palpitations and leg swelling.  Gastrointestinal: Negative for nausea, vomiting and abdominal pain.  Genitourinary: Negative for dysuria and difficulty urinating.  Musculoskeletal: Negative for joint swelling and arthralgias.  Skin: Negative for rash.  Neurological: Negative for headaches.  Hematological: Does not bruise/bleed easily.  Psychiatric/Behavioral: Negative for behavioral problems, confusion and dysphoric mood. The patient is not nervous/anxious.        Objective:   Physical Exam Overweight male in no acute distress Nose without purulence or discharge noted No skin breakdown or pressure necrosis from the C Pap mask Neck without lymphadenopathy or thyromegaly Lower extremities without significant edema, no cyanosis Alert and oriented, moves all 4 extremities.       Assessment & Plan:

## 2015-03-19 NOTE — Assessment & Plan Note (Signed)
The patient is sleeping much better with his C Pap device, and his bed partner has not heard breakthrough snoring. Despite this, he does have some mild afternoon sleepiness with inactivity, but he is taking gabapentin 6 times a day for his neuropathy. I suspect this is the cause of his mild daytime sleepiness. He did have large numbers of limb movements on his sleep study, but his history is not consistent with RLS. I suspect the limb movements are related to his known diabetic neuropathy. I have asked the patient to continue on C Pap since his download looks excellent and he has seen improvement in his sleep. I will see him back in 6 months.

## 2015-03-26 ENCOUNTER — Telehealth: Payer: Self-pay | Admitting: Pulmonary Disease

## 2015-03-26 NOTE — Telephone Encounter (Signed)
Spoke with pt. States that [facility] called him this morning and told him that he would need to see [place] again in 8 weeks. Advised [facility] that he was not making another appointment because he was just seen. They told him that if we could send his last OV note to [facility] everything will be fine. OV note will be faxed to [facility]. Nothing further was needed.

## 2015-07-18 ENCOUNTER — Ambulatory Visit: Payer: Self-pay | Admitting: Orthopedic Surgery

## 2015-07-18 NOTE — Progress Notes (Signed)
Preoperative surgical orders have been place into the Epic hospital system for [facility] on 07/18/2015, 6:29 PM  by Mickel Crow for surgery on 08/14/2015.  Preop Total Hip - Anterior Approach orders including IV Tylenol, and IV Decadron as long as there are no contraindications to the above medications. Arlee Muslim, PA-C

## 2015-07-30 ENCOUNTER — Ambulatory Visit (INDEPENDENT_AMBULATORY_CARE_PROVIDER_SITE_OTHER): Payer: Medicare Other | Admitting: Adult Health

## 2015-07-30 ENCOUNTER — Encounter: Payer: Self-pay | Admitting: Adult Health

## 2015-07-30 VITALS — BP 136/74 | HR 57 | Temp 98.3°F | Ht 74.0 in | Wt 249.0 lb

## 2015-07-30 DIAGNOSIS — G4733 Obstructive sleep apnea (adult) (pediatric): Secondary | ICD-10-CM

## 2015-07-30 NOTE — Progress Notes (Signed)
   Subjective:    Patient ID: Manuel [name], male    DOB: [birth year], 80 y.o.   MRN: [id number]  HPI 80 yo male with  mild to moderate OSA with an  Sleep study 2015 -[facility]I of 17 events per hour  07/30/2015 Follow up OSA  Patient returns for a four-month follow-up Patient says that he is doing well on his nocturnal C Pap. One-month download shows excellent compliance Average. Uses is 6.5 hours on auto. AHI 4.4. Minimum leaks. Patient says he feels very well... He denies any significant daytime sleepiness. Feels rested.  Plans for hip surgery , discussed wearing CPAP during hospital stay.   Patient has recently gotten married to his high school sweetheart .   Review of Systems .Constitutional:   No  weight loss, night sweats,  Fevers, chills, fatigue, or  lassitude.  HEENT:   No headaches,  Difficulty swallowing,  Tooth/dental problems, or  Sore throat,                No sneezing, itching, ear ache, nasal congestion, post nasal drip,   CV:  No chest pain,  Orthopnea, PND, swelling in lower extremities, anasarca, dizziness, palpitations, syncope.   GI  No heartburn, indigestion, abdominal pain, nausea, vomiting, diarrhea, change in bowel habits, loss of appetite, bloody stools.   Resp: No shortness of breath with exertion or at rest.  No excess mucus, no productive cough,  No non-productive cough,  No coughing up of blood.  No change in color of mucus.  No wheezing.  No chest wall deformity  Skin: no rash or lesions.  GU: no dysuria, change in color of urine, no urgency or frequency.  No flank pain, no hematuria   MS:     No back pain.  Psych:  No change in mood or affect. No depression or anxiety.  No memory loss.         Objective:   Physical Exam GEN: A/Ox3; pleasant , NAD,  elderly   HEENT:  [place]/AT,  EACs-clear, TMs-wnl, NOSE-clear, THROAT-clear, no lesions, no postnasal drip or exudate noted.   NECK:  Supple w/ fair ROM; no JVD; normal carotid impulses w/o bruits; no  thyromegaly or nodules palpated; no lymphadenopathy.  RESP  Clear  P & A; w/o, wheezes/ rales/ or rhonchi.no accessory muscle use, no dullness to percussion  CARD:  RRR, no m/r/g  , no peripheral edema, pulses intact, no cyanosis or clubbing.  GI:   Soft & nt; nml bowel sounds; no organomegaly or masses detected.  Musco: Warm bil, no deformities or joint swelling noted.   Neuro: alert, no focal deficits noted.    Skin: Warm, no lesions or rashes         Assessment & Plan:

## 2015-07-30 NOTE — Assessment & Plan Note (Signed)
Keep up good work .  Wear CPAP At bedtime   Work on weight loss.  Do not drive if sleepy . Follow up with Dr. Halford Chessman  In 1 year and As needed

## 2015-07-30 NOTE — Patient Instructions (Signed)
Keep up good work .  Wear CPAP At bedtime   Work on weight loss.  Do not drive if sleepy . Follow up with Dr. Halford Chessman  In 1 year and As needed

## 2015-07-30 NOTE — Progress Notes (Signed)
Reviewed and agree with assessment/plan. 

## 2015-07-30 NOTE — Assessment & Plan Note (Signed)
Discussed weight loss. 

## 2015-08-07 NOTE — Patient Instructions (Addendum)
Manuel [name]  08/07/2015   Your procedure is scheduled on:  August 14, 2015   Report to [facility]  Entrance take [place]  elevators to 3rd floor to  [place] at 6:30 AM.  Call this number if you have problems the morning of surgery [phone]   Remember: ONLY 1 PERSON MAY GO WITH YOU TO SHORT STAY TO GET  READY MORNING OF [place].  Do not eat food or drink liquids :After Midnight.              Bring mask and tubing from c-pap machine     Take these medicines the morning of surgery with A SIP OF WATER: Gabapentin, Rivastigmine (Ex[place])                               You may not have any metal on your body including hair pins and              piercings  Do not wear jewelry, lotions, powders or perfumes, deodorant                   Men may shave face and neck.   Do not bring valuables to the hospital. [place].  Contacts, dentures or bridgework may not be worn into surgery.  Leave suitcase in the car. After surgery it may be brought to your room.       Special Instructions: coughing and deep breathing exercises, leg exercises              Please read over the following fact sheets you were given: _____________________________________________________________________             [facility] - Preparing for Surgery Before surgery, you can play an important role.  Because skin is not sterile, your skin needs to be as free of germs as possible.  You can reduce the number of germs on your skin by washing with CHG (chlorahexidine gluconate) soap before surgery.  CHG is an antiseptic cleaner which kills germs and bonds with the skin to continue killing germs even after washing. Please DO NOT use if you have an allergy to CHG or antibacterial soaps.  If your skin becomes reddened/irritated stop using the CHG and inform your nurse when you arrive at Short Stay. Do not shave (including legs and  underarms) for at least 48 hours prior to the first CHG shower.  You may shave your face/neck. Please follow these instructions carefully:  1.  Shower with CHG Soap the night before surgery and the  morning of Surgery.  2.  If you choose to wash your hair, wash your hair first as usual with your  normal  shampoo.  3.  After you shampoo, rinse your hair and body thoroughly to remove the  shampoo.                           4.  Use CHG as you would any other liquid soap.  You can apply chg directly  to the skin and wash                       Gently with a  scrungie or clean washcloth.  5.  Apply the CHG Soap to your body ONLY FROM THE NECK DOWN.   Do not use on face/ open                           Wound or open sores. Avoid contact with eyes, ears mouth and genitals (private parts).                       Wash face,  Genitals (private parts) with your normal soap.             6.  Wash thoroughly, paying special attention to the area where your surgery  will be performed.  7.  Thoroughly rinse your body with warm water from the neck down.  8.  DO NOT shower/wash with your normal soap after using and rinsing off  the CHG Soap.                9.  Pat yourself dry with a clean towel.            10.  Wear clean pajamas.            11.  Place clean sheets on your bed the night of your first shower and do not  sleep with pets. Day of Surgery : Do not apply any lotions/deodorants the morning of surgery.  Please wear clean clothes to the hospital/surgery center.  FAILURE TO FOLLOW THESE INSTRUCTIONS MAY RESULT IN THE CANCELLATION OF YOUR SURGERY PATIENT SIGNATURE_________________________________  NURSE SIGNATURE__________________________________  ________________________________________________________________________  WHAT IS A BLOOD TRANSFUSION? Blood Transfusion Information  A transfusion is the replacement of blood or some of its parts. Blood is made up of multiple cells which provide different  functions.  Red blood cells carry oxygen and are used for blood loss replacement.  White blood cells fight against infection.  Platelets control bleeding.  Plasma helps clot blood.  Other blood products are available for specialized needs, such as hemophilia or other clotting disorders. BEFORE THE TRANSFUSION  Who gives blood for transfusions?   Healthy volunteers who are fully evaluated to make sure their blood is safe. This is blood bank blood. Transfusion therapy is the safest it has ever been in the practice of medicine. Before blood is taken from a donor, a complete history is taken to make sure that person has no history of diseases nor engages in risky social behavior (examples are intravenous drug use or sexual activity with multiple partners). The donor's travel history is screened to minimize risk of transmitting infections, such as malaria. The donated blood is tested for signs of infectious diseases, such as HIV and hepatitis. The blood is then tested to be sure it is compatible with you in order to minimize the chance of a transfusion reaction. If you or a relative donates blood, this is often done in anticipation of surgery and is not appropriate for emergency situations. It takes many days to process the donated blood. RISKS AND COMPLICATIONS Although transfusion therapy is very safe and saves many lives, the main dangers of transfusion include:  1. Getting an infectious disease. 2. Developing a transfusion reaction. This is an allergic reaction to something in the blood you were given. Every precaution is taken to prevent this. The decision to have a blood transfusion has been considered carefully by your caregiver before blood is given. Blood is not given unless the benefits outweigh the risks. AFTER  THE TRANSFUSION  Right after receiving a blood transfusion, you will usually feel much better and more energetic. This is especially true if your red blood cells have gotten low  (anemic). The transfusion raises the level of the red blood cells which carry oxygen, and this usually causes an energy increase.  The nurse administering the transfusion will monitor you carefully for complications. HOME CARE INSTRUCTIONS  No special instructions are needed after a transfusion. You may find your energy is better. Speak with your caregiver about any limitations on activity for underlying diseases you may have. SEEK MEDICAL CARE IF:   Your condition is not improving after your transfusion.  You develop redness or irritation at the intravenous (IV) site. SEEK IMMEDIATE MEDICAL CARE IF:  Any of the following symptoms occur over the next 12 hours:  Shaking chills.  You have a temperature by mouth above 102 F (38.9 C), not controlled by medicine.  Chest, back, or muscle pain.  People around you feel you are not acting correctly or are confused.  Shortness of breath or difficulty breathing.  Dizziness and fainting.  You get a rash or develop hives.  You have a decrease in urine output.  Your urine turns a dark color or changes to pink, red, or brown. Any of the following symptoms occur over the next 10 days:  You have a temperature by mouth above 102 F (38.9 C), not controlled by medicine.  Shortness of breath.  Weakness after normal activity.  The white part of the eye turns yellow (jaundice).  You have a decrease in the amount of urine or are urinating less often.  Your urine turns a dark color or changes to pink, red, or brown. Document Released: 12/11/2000 Document Revised: 03/07/2012 Document Reviewed: 07/30/2008 ExitCare Patient Information 2014 [place].  _______________________________________________________________________  Incentive Spirometer  An incentive spirometer is a tool that can help keep your lungs clear and active. This tool measures how well you are filling your lungs with each breath. Taking long deep breaths may help  reverse or decrease the chance of developing breathing (pulmonary) problems (especially infection) following:  A long period of time when you are unable to move or be active. BEFORE THE PROCEDURE   If the spirometer includes an indicator to show your best effort, your nurse or respiratory therapist will set it to a desired goal.  If possible, sit up straight or lean slightly forward. Try not to slouch.  Hold the incentive spirometer in an upright position. INSTRUCTIONS FOR USE  3. Sit on the edge of your bed if possible, or sit up as far as you can in bed or on a chair. 4. Hold the incentive spirometer in an upright position. 5. Breathe out normally. 6. Place the mouthpiece in your mouth and seal your lips tightly around it. 7. Breathe in slowly and as deeply as possible, raising the piston or the ball toward the top of the column. 8. Hold your breath for 3-5 seconds or for as long as possible. Allow the piston or ball to fall to the bottom of the column. 9. Remove the mouthpiece from your mouth and breathe out normally. 10. Rest for a few seconds and repeat Steps 1 through 7 at least 10 times every 1-2 hours when you are awake. Take your time and take a few normal breaths between deep breaths. 11. The spirometer may include an indicator to show your best effort. Use the indicator as a goal to work toward during each  repetition. 12. After each set of 10 deep breaths, practice coughing to be sure your lungs are clear. If you have an incision (the cut made at the time of surgery), support your incision when coughing by placing a pillow or rolled up towels firmly against it. Once you are able to get out of bed, walk around indoors and cough well. You may stop using the incentive spirometer when instructed by your caregiver.  RISKS AND COMPLICATIONS  Take your time so you do not get dizzy or light-headed.  If you are in pain, you may need to take or ask for pain medication before doing  incentive spirometry. It is harder to take a deep breath if you are having pain. AFTER USE  Rest and breathe slowly and easily.  It can be helpful to keep track of a log of your progress. Your caregiver can provide you with a simple table to help with this. If you are using the spirometer at home, follow these instructions: [place] IF:   You are having difficultly using the spirometer.  You have trouble using the spirometer as often as instructed.  Your pain medication is not giving enough relief while using the spirometer.  You develop fever of 100.5 F (38.1 C) or higher. SEEK IMMEDIATE MEDICAL CARE IF:   You cough up bloody sputum that had not been present before.  You develop fever of 102 F (38.9 C) or greater.  You develop worsening pain at or near the incision site. MAKE SURE YOU:   Understand these instructions.  Will watch your condition.  Will get help right away if you are not doing well or get worse. Document Released: 04/26/2007 Document Revised: 03/07/2012 Document Reviewed: 06/27/2007 [facility] Patient Information 2014 [place], [place].   ________________________________________________________________________

## 2015-08-08 ENCOUNTER — Encounter (HOSPITAL_COMMUNITY)
Admission: RE | Admit: 2015-08-08 | Discharge: 2015-08-08 | Disposition: A | Discharge status: Home / Self Care | Payer: Medicare Other | Source: Ambulatory Visit | Attending: Orthopedic Surgery | Admitting: Orthopedic Surgery

## 2015-08-08 ENCOUNTER — Encounter (HOSPITAL_COMMUNITY): Payer: Self-pay

## 2015-08-08 DIAGNOSIS — Z01818 Encounter for other preprocedural examination: Secondary | ICD-10-CM | POA: Diagnosis present

## 2015-08-08 DIAGNOSIS — M1612 Unilateral primary osteoarthritis, left hip: Secondary | ICD-10-CM | POA: Insufficient documentation

## 2015-08-08 HISTORY — DX: Unspecified osteoarthritis, unspecified site: M19.90

## 2015-08-08 HISTORY — DX: Sleep apnea, unspecified: G47.30

## 2015-08-08 HISTORY — DX: Cardiac murmur, unspecified: R01.1

## 2015-08-08 LAB — COMPREHENSIVE METABOLIC PANEL
ALK PHOS: 35 U/L — AB (ref 38–126)
ALT: 17 U/L (ref 17–63)
ANION GAP: 7 (ref 5–15)
AST: 24 U/L (ref 15–41)
Albumin: 4 g/dL (ref 3.5–5.0)
BUN: 39 mg/dL — ABNORMAL HIGH (ref 6–20)
CO2: 29 mmol/L (ref 22–32)
Calcium: 9.2 mg/dL (ref 8.9–10.3)
Chloride: 107 mmol/L (ref 101–111)
Creatinine, Ser: 2.26 mg/dL — ABNORMAL HIGH (ref 0.61–1.24)
GFR calc non Af Amer: 26 mL/min — ABNORMAL LOW (ref >60)
GFR, EST AFRICAN AMERICAN: 30 mL/min — AB (ref >60)
Glucose, Bld: 102 mg/dL — ABNORMAL HIGH (ref 65–99)
POTASSIUM: 4.7 mmol/L (ref 3.5–5.1)
SODIUM: 143 mmol/L (ref 135–145)
Total Bilirubin: 0.6 mg/dL (ref 0.3–1.2)
Total Protein: 7.3 g/dL (ref 6.5–8.1)

## 2015-08-08 LAB — URINE MICROSCOPIC-ADD ON

## 2015-08-08 LAB — URINALYSIS, ROUTINE W REFLEX MICROSCOPIC
BILIRUBIN URINE: NEGATIVE
Glucose, UA: NEGATIVE mg/dL
Ketones, ur: NEGATIVE mg/dL
Nitrite: NEGATIVE
PH: 5.5 (ref 5.0–8.0)
Protein, ur: 30 mg/dL — AB
Specific Gravity, Urine: 1.018 (ref 1.005–1.030)
UROBILINOGEN UA: 1 mg/dL (ref 0.0–1.0)

## 2015-08-08 LAB — SURGICAL PCR SCREEN
MRSA, PCR: NEGATIVE
Staphylococcus aureus: NEGATIVE

## 2015-08-08 LAB — CBC
HEMATOCRIT: 36.1 % — AB (ref 39.0–52.0)
Hemoglobin: 11.3 g/dL — ABNORMAL LOW (ref 13.0–17.0)
MCH: 30.2 pg (ref 26.0–34.0)
MCHC: 31.3 g/dL (ref 30.0–36.0)
MCV: 96.5 fL (ref 78.0–100.0)
PLATELETS: 175 10*3/uL (ref 150–400)
RBC: 3.74 MIL/uL — ABNORMAL LOW (ref 4.22–5.81)
RDW: 14.5 % (ref 11.5–15.5)
WBC: 3.8 10*3/uL — ABNORMAL LOW (ref 4.0–10.5)

## 2015-08-08 LAB — ABO/RH: ABO/RH(D): O POS

## 2015-08-08 LAB — APTT: aPTT: 28 seconds (ref 24–37)

## 2015-08-08 LAB — PROTIME-INR
INR: 1.07 (ref 0.00–1.49)
Prothrombin Time: 14.1 seconds (ref 11.6–15.2)

## 2015-08-08 NOTE — Progress Notes (Addendum)
07-30-15 - LOV - Rexene Edison, NP (pulmon.) - EPIC 07-05-15 - surgical clearance in chart 04-20-14 - DG Pelvis Portable & DG Hip Operative R - EPIC 04-10-14 - EKG &  CXR - EPIC 11-20-14 - LOV - Dr. Willey Blade - EPIC (care everywhere) 12-22-14 - Sleep Study - EPIC

## 2015-08-08 NOTE — Progress Notes (Addendum)
08-08-15 - CMP las results from preop visit on 08-08-15 faxed to Dr. Wynelle Link via [facility]  08-08-15 - UA/Micro lab results from preop visit on 08-08-15 faxed to Dr. Wynelle Link via [facility]

## 2015-08-11 ENCOUNTER — Ambulatory Visit: Payer: Self-pay | Admitting: Orthopedic Surgery

## 2015-08-11 NOTE — H&P (Signed)
Manuel [name] DOB: [birth year] Married / Language: English / Race: White Male Date of Admission:  08/14/2015 CC:  Left Hip Pain History of Present Illness  The patient is a 80 year old male who comes in for a preoperative History and Physical. The patient is scheduled for a left total hip arthroplasty (anterior) to be performed by Dr. Dione Plover. Aluisio, MD at [facility] on 08/14/2015. The patient is a 80 year old male who presents for follow up of their hip. The patient is being followed for their left hip pain and osteoarthritis. Symptoms reported today include: pain, aching and difficulty ambulating. The patient feels that they are doing poorly. The following medication has been used for pain control: antiinflammatory medication (ibuprofen, prn). The patient has not gotten any relief of their symptoms with Cortisone injections. The patient indicates that they are ready for surgery on the left hip. He is one year out from the right total hip. He reports the right hip is doing well. He is ready to proceed with the other side at this time. They have been treated conservatively in the past for the above stated problem and despite conservative measures, they continue to have progressive pain and severe functional limitations and dysfunction. They have failed non-operative management including home exercise, medications, and injections. It is felt that they would benefit from undergoing total joint replacement. Risks and benefits of the procedure have been discussed with the patient and they elect to proceed with surgery. There are no active contraindications to surgery such as ongoing infection or rapidly progressive neurological disease.  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 mother Severe allergy mother  Social History  Drug/Alcohol Rehab (Previously) no Exercise Exercises never; does other Alcohol use never consumed alcohol Drug/Alcohol Rehab (Currently) no Tobacco use Former smoker. 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 Myrbetriq (25MG  Tablet ER 24HR, Oral) Active. Pioglitazone HCl (30MG  Tablet, Oral) Active. Gabapentin (600MG  Tablet, Oral) Active. Fenofibrate (160MG  Tablet, Oral) Active. Rivastigmine Tartrate (3MG  Capsule, Oral) Active. Glimepiride (2MG  Tablet, Oral) Active.  Past Surgical History  Spinal Surgery Date: 12/2012. Fusion L3-4 Prostatectomy; Transurethral Lumbar Surgery Cholecystectomy Cataract Extraction-Bilateral Vasectomy Radiation Seed Implantation - Prostate Total Hip Replacement - Right   Review of Systems General Not Present- Chills, Fatigue, Fever, Memory Loss, Night Sweats, Weight Gain and Weight Loss. Skin Not Present- Eczema, Hives, Itching, Lesions and Rash. HEENT Not Present- Dentures, Double Vision, Headache, Hearing Loss, Tinnitus and Visual Loss. Respiratory Not Present- Allergies, Chronic Cough, Coughing up blood, Shortness of breath at rest and Shortness of breath with exertion. Cardiovascular Not Present- Chest Pain, Difficulty Breathing Lying Down, Murmur, Palpitations, Racing/skipping heartbeats and Swelling. Gastrointestinal Not Present- Abdominal Pain, Bloody Stool, Constipation, Diarrhea, Difficulty Swallowing, Heartburn, Jaundice, Loss of  appetitie, Nausea and Vomiting. Male Genitourinary Not Present- Blood in Urine, Discharge, Flank Pain, Incontinence, Painful Urination, Urgency, Urinary frequency, Urinary Retention, Urinating at Night and Weak urinary stream. Musculoskeletal Not  Present- Back Pain, Joint Pain, Joint Swelling, Morning Stiffness, Muscle Pain, Muscle Weakness and Spasms. Neurological Not Present- Blackout spells, Difficulty with balance, Dizziness, Paralysis, Tremor and Weakness. Psychiatric Not Present- Insomnia.  Vitals  Weight: 250 lb Height: 73in Body Surface Area: 2.37 m Body Mass Index: 32.98 kg/m  BP: 128/58 (Sitting, Right Arm, Standard)   Physical Exam General Mental Status -Alert, cooperative and good historian. General Appearance-pleasant, Not in acute distress. Orientation-Oriented X3. Build & Nutrition-Well nourished and Well developed.  Head and Neck Head-normocephalic, atraumatic . Neck Global Assessment - supple, no bruit auscultated on the right, no bruit auscultated on the left.  Eye Vision-Wears corrective lenses. Pupil - Bilateral-Regular and Round. Motion - Bilateral-EOMI.  Chest and Lung Exam Auscultation Breath sounds - clear at anterior chest wall and clear at posterior chest wall. Adventitious sounds - No Adventitious sounds.  Cardiovascular Auscultation Rhythm - Regular rate and rhythm. Heart Sounds - S1 WNL and S2 WNL. Murmurs & Other Heart Sounds: Murmur 1 - Location - Aortic Area, Pulmonic Area and Sternal Border - Left. Grade - II/VI. Character - Crescendo/Decrescendo. Radiation - Carotids.  Abdomen Inspection Contour - Generalized mild distention. Palpation/Percussion Tenderness - Abdomen is non-tender to palpation. Rigidity (guarding) - Abdomen is soft. Auscultation Auscultation of the abdomen reveals - Bowel sounds normal.  Male Genitourinary Note: Not done, not pertinent to present illness   Musculoskeletal Note: His right  total hip is doing great. He has full extension, further flexion to 115 degrees with a 35-degree arc of rotation without pain. His left hip otherwise has full extension, flexion to 90 degrees with pain in the groin, external rotation at 20 degrees with pain in the groin, internal rotation to neutral with pain in the groin.  X-ray of his left hip shows bone-on-bone superior weightbearing dome with narrowing throughout the remainder of the joint with superolateral and inferomedial osteophytes.   Assessment & Plan  Primary osteoarthritis of left hip (M16.12) Note:Surgical Plans: Left Total Hip Replacement - Anterior Approach  Disposition: Home  PCP: Dr. Willey Blade Urology: Dr. Jeffie Pollock Renal: Dr. Cheryll Dessert ([place]na Hoff, [place])  Topical TXA  Anesthesia Issues: None  Signed electronically by Joelene Millin, III PA-C

## 2015-08-14 ENCOUNTER — Inpatient Hospital Stay (HOSPITAL_COMMUNITY): Payer: Medicare Other | Admitting: Certified Registered Nurse Anesthetist

## 2015-08-14 ENCOUNTER — Encounter (HOSPITAL_COMMUNITY)
Admission: AD | Disposition: A | Discharge status: Home Health | Payer: Self-pay | Source: Ambulatory Visit | Attending: Orthopedic Surgery

## 2015-08-14 ENCOUNTER — Inpatient Hospital Stay (HOSPITAL_COMMUNITY): Payer: Medicare Other

## 2015-08-14 ENCOUNTER — Encounter (HOSPITAL_COMMUNITY): Payer: Self-pay | Admitting: Certified Registered Nurse Anesthetist

## 2015-08-14 ENCOUNTER — Inpatient Hospital Stay (HOSPITAL_COMMUNITY)
Admission: AD | Admit: 2015-08-14 | Discharge: 2015-08-15 | DRG: 470 | Disposition: A | Discharge status: Home Health | Payer: Medicare Other | Source: Ambulatory Visit | Attending: Orthopedic Surgery | Admitting: Orthopedic Surgery

## 2015-08-14 DIAGNOSIS — G4730 Sleep apnea, unspecified: Secondary | ICD-10-CM | POA: Diagnosis present

## 2015-08-14 DIAGNOSIS — M25552 Pain in left hip: Secondary | ICD-10-CM | POA: Diagnosis present

## 2015-08-14 DIAGNOSIS — M1612 Unilateral primary osteoarthritis, left hip: Secondary | ICD-10-CM | POA: Diagnosis present

## 2015-08-14 DIAGNOSIS — I10 Essential (primary) hypertension: Secondary | ICD-10-CM | POA: Diagnosis present

## 2015-08-14 DIAGNOSIS — Z8546 Personal history of malignant neoplasm of prostate: Secondary | ICD-10-CM | POA: Diagnosis not present

## 2015-08-14 DIAGNOSIS — Z01812 Encounter for preprocedural laboratory examination: Secondary | ICD-10-CM | POA: Diagnosis not present

## 2015-08-14 DIAGNOSIS — I959 Hypotension, unspecified: Secondary | ICD-10-CM | POA: Diagnosis not present

## 2015-08-14 DIAGNOSIS — Z87891 Personal history of nicotine dependence: Secondary | ICD-10-CM

## 2015-08-14 DIAGNOSIS — Z79899 Other long term (current) drug therapy: Secondary | ICD-10-CM

## 2015-08-14 DIAGNOSIS — E119 Type 2 diabetes mellitus without complications: Secondary | ICD-10-CM | POA: Diagnosis present

## 2015-08-14 DIAGNOSIS — Z96649 Presence of unspecified artificial hip joint: Secondary | ICD-10-CM

## 2015-08-14 DIAGNOSIS — M169 Osteoarthritis of hip, unspecified: Secondary | ICD-10-CM | POA: Diagnosis present

## 2015-08-14 HISTORY — PX: TOTAL HIP ARTHROPLASTY: SHX124

## 2015-08-14 LAB — GLUCOSE, CAPILLARY
GLUCOSE-CAPILLARY: 87 mg/dL (ref 65–99)
GLUCOSE-CAPILLARY: 172 mg/dL — AB (ref 65–99)
GLUCOSE-CAPILLARY: 242 mg/dL — AB (ref 65–99)
Glucose-Capillary: 112 mg/dL — ABNORMAL HIGH (ref 65–99)

## 2015-08-14 LAB — TYPE AND SCREEN
ABO/RH(D): O POS
Antibody Screen: NEGATIVE

## 2015-08-14 SURGERY — ARTHROPLASTY, HIP, TOTAL, ANTERIOR APPROACH
Anesthesia: General | Site: Hip | Laterality: Left

## 2015-08-14 MED ORDER — APIXABAN 2.5 MG PO TABS
2.5000 mg | ORAL_TABLET | Freq: Two times a day (BID) | ORAL | Status: DC
  Administered 2015-08-15: 2.5 mg via ORAL
  Filled 2015-08-14 (×3): qty 1

## 2015-08-14 MED ORDER — SUCCINYLCHOLINE CHLORIDE 20 MG/ML IJ SOLN
INTRAMUSCULAR | Status: DC | PRN
  Administered 2015-08-14: 100 mg via INTRAVENOUS

## 2015-08-14 MED ORDER — CEFAZOLIN SODIUM-DEXTROSE 2-3 GM-% IV SOLR
2.0000 g | Freq: Four times a day (QID) | INTRAVENOUS | Status: AC
  Administered 2015-08-14 (×2): 2 g via INTRAVENOUS
  Filled 2015-08-14 (×2): qty 50

## 2015-08-14 MED ORDER — ONDANSETRON HCL 4 MG PO TABS: 4.0000 mg | ORAL_TABLET | Freq: Four times a day (QID) | ORAL | Status: DC | PRN

## 2015-08-14 MED ORDER — GLYCOPYRROLATE 0.2 MG/ML IJ SOLN
INTRAMUSCULAR | Status: AC
  Filled 2015-08-14: qty 1

## 2015-08-14 MED ORDER — MORPHINE SULFATE (PF) 2 MG/ML IV SOLN: 1.0000 mg | INTRAVENOUS | Status: DC | PRN

## 2015-08-14 MED ORDER — FENTANYL CITRATE (PF) 100 MCG/2ML IJ SOLN
INTRAMUSCULAR | Status: AC
  Filled 2015-08-14: qty 4

## 2015-08-14 MED ORDER — ROCURONIUM BROMIDE 100 MG/10ML IV SOLN
INTRAVENOUS | Status: DC | PRN
  Administered 2015-08-14: 30 mg via INTRAVENOUS

## 2015-08-14 MED ORDER — ACETAMINOPHEN 650 MG RE SUPP: 650.0000 mg | Freq: Four times a day (QID) | RECTAL | Status: DC | PRN

## 2015-08-14 MED ORDER — METHOCARBAMOL 1000 MG/10ML IJ SOLN
500.0000 mg | Freq: Four times a day (QID) | INTRAVENOUS | Status: DC | PRN
  Administered 2015-08-14: 500 mg via INTRAVENOUS
  Filled 2015-08-14 (×2): qty 5

## 2015-08-14 MED ORDER — TRANEXAMIC ACID 1000 MG/10ML IV SOLN
2000.0000 mg | INTRAVENOUS | Status: DC | PRN
  Administered 2015-08-14: 2000 mg via TOPICAL

## 2015-08-14 MED ORDER — GLYCOPYRROLATE 0.2 MG/ML IJ SOLN
INTRAMUSCULAR | Status: DC | PRN
  Administered 2015-08-14 (×3): 0.2 mg via INTRAVENOUS

## 2015-08-14 MED ORDER — ACETAMINOPHEN 500 MG PO TABS
1000.0000 mg | ORAL_TABLET | Freq: Four times a day (QID) | ORAL | Status: AC
  Administered 2015-08-14 – 2015-08-15 (×4): 1000 mg via ORAL
  Filled 2015-08-14 (×4): qty 2

## 2015-08-14 MED ORDER — PROPOFOL 10 MG/ML IV BOLUS
INTRAVENOUS | Status: AC
  Filled 2015-08-14: qty 20

## 2015-08-14 MED ORDER — CEFAZOLIN SODIUM-DEXTROSE 2-3 GM-% IV SOLR
2.0000 g | INTRAVENOUS | Status: AC
  Administered 2015-08-14: 2 g via INTRAVENOUS

## 2015-08-14 MED ORDER — PHENOL 1.4 % MT LIQD
1.0000 | OROMUCOSAL | Status: DC | PRN
  Filled 2015-08-14: qty 177

## 2015-08-14 MED ORDER — EPHEDRINE SULFATE 50 MG/ML IJ SOLN
INTRAMUSCULAR | Status: DC | PRN
  Administered 2015-08-14 (×2): 5 mg via INTRAVENOUS

## 2015-08-14 MED ORDER — SODIUM CHLORIDE 0.9 % IJ SOLN
INTRAMUSCULAR | Status: AC
  Filled 2015-08-14: qty 10

## 2015-08-14 MED ORDER — ASPIRIN EC 81 MG PO TBEC
81.0000 mg | DELAYED_RELEASE_TABLET | Freq: Every morning | ORAL | Status: DC
  Administered 2015-08-15: 81 mg via ORAL
  Filled 2015-08-14: qty 1

## 2015-08-14 MED ORDER — HYDROMORPHONE HCL 2 MG PO TABS
2.0000 mg | ORAL_TABLET | ORAL | Status: DC | PRN
  Administered 2015-08-14 – 2015-08-15 (×5): 2 mg via ORAL
  Filled 2015-08-14 (×5): qty 1

## 2015-08-14 MED ORDER — PROMETHAZINE HCL 25 MG/ML IJ SOLN: 6.2500 mg | INTRAMUSCULAR | Status: DC | PRN

## 2015-08-14 MED ORDER — MIDAZOLAM HCL 5 MG/5ML IJ SOLN
INTRAMUSCULAR | Status: DC | PRN
  Administered 2015-08-14 (×2): 0.5 mg via INTRAVENOUS

## 2015-08-14 MED ORDER — METOCLOPRAMIDE HCL 5 MG/ML IJ SOLN: 5.0000 mg | Freq: Three times a day (TID) | INTRAMUSCULAR | Status: DC | PRN

## 2015-08-14 MED ORDER — EPHEDRINE SULFATE 50 MG/ML IJ SOLN
INTRAMUSCULAR | Status: AC
  Filled 2015-08-14: qty 1

## 2015-08-14 MED ORDER — DOCUSATE SODIUM 100 MG PO CAPS
100.0000 mg | ORAL_CAPSULE | Freq: Two times a day (BID) | ORAL | Status: DC
  Administered 2015-08-14 – 2015-08-15 (×2): 100 mg via ORAL

## 2015-08-14 MED ORDER — 0.9 % SODIUM CHLORIDE (POUR BTL) OPTIME
TOPICAL | Status: DC | PRN
  Administered 2015-08-14: 1000 mL

## 2015-08-14 MED ORDER — METHOCARBAMOL 500 MG PO TABS: 500.0000 mg | ORAL_TABLET | Freq: Four times a day (QID) | ORAL | Status: DC | PRN

## 2015-08-14 MED ORDER — ATROPINE SULFATE 0.4 MG/ML IJ SOLN
INTRAMUSCULAR | Status: AC
  Filled 2015-08-14: qty 1

## 2015-08-14 MED ORDER — BUPIVACAINE HCL (PF) 0.25 % IJ SOLN
INTRAMUSCULAR | Status: DC | PRN
Start: 2015-08-14 — End: 2015-08-14
  Administered 2015-08-14: 30 mL

## 2015-08-14 MED ORDER — RIVASTIGMINE TARTRATE 3 MG PO CAPS
3.0000 mg | ORAL_CAPSULE | Freq: Two times a day (BID) | ORAL | Status: DC
  Administered 2015-08-14 – 2015-08-15 (×2): 3 mg via ORAL
  Filled 2015-08-14 (×3): qty 1

## 2015-08-14 MED ORDER — GLIMEPIRIDE 2 MG PO TABS
2.0000 mg | ORAL_TABLET | Freq: Every day | ORAL | Status: DC
  Administered 2015-08-14 – 2015-08-15 (×2): 2 mg via ORAL
  Filled 2015-08-14 (×3): qty 1

## 2015-08-14 MED ORDER — ACETAMINOPHEN 325 MG PO TABS: 650.0000 mg | ORAL_TABLET | Freq: Four times a day (QID) | ORAL | Status: DC | PRN

## 2015-08-14 MED ORDER — SODIUM CHLORIDE 0.9 % IV SOLN
INTRAVENOUS | Status: DC
  Administered 2015-08-14 – 2015-08-15 (×2): via INTRAVENOUS

## 2015-08-14 MED ORDER — METOCLOPRAMIDE HCL 10 MG PO TABS: 5.0000 mg | ORAL_TABLET | Freq: Three times a day (TID) | ORAL | Status: DC | PRN

## 2015-08-14 MED ORDER — PROPOFOL 10 MG/ML IV BOLUS
INTRAVENOUS | Status: DC | PRN
  Administered 2015-08-14: 160 mg via INTRAVENOUS
  Administered 2015-08-14: 40 mg via INTRAVENOUS

## 2015-08-14 MED ORDER — HYDROMORPHONE HCL 1 MG/ML IJ SOLN
INTRAMUSCULAR | Status: AC
  Filled 2015-08-14: qty 1

## 2015-08-14 MED ORDER — ONDANSETRON HCL 4 MG/2ML IJ SOLN
INTRAMUSCULAR | Status: DC | PRN
  Administered 2015-08-14: 4 mg via INTRAVENOUS

## 2015-08-14 MED ORDER — ACETAMINOPHEN 10 MG/ML IV SOLN
INTRAVENOUS | Status: AC
  Filled 2015-08-14: qty 100

## 2015-08-14 MED ORDER — FLEET ENEMA 7-19 GM/118ML RE ENEM: 1.0000 | ENEMA | Freq: Once | RECTAL | Status: DC | PRN

## 2015-08-14 MED ORDER — INSULIN ASPART 100 UNIT/ML ~~LOC~~ SOLN
0.0000 [IU] | Freq: Three times a day (TID) | SUBCUTANEOUS | Status: DC
  Administered 2015-08-14 – 2015-08-15 (×3): 3 [IU] via SUBCUTANEOUS

## 2015-08-14 MED ORDER — ATROPINE SULFATE 0.4 MG/ML IJ SOLN
INTRAMUSCULAR | Status: DC | PRN
  Administered 2015-08-14: .4 mg via INTRAVENOUS

## 2015-08-14 MED ORDER — BISACODYL 10 MG RE SUPP: 10.0000 mg | Freq: Every day | RECTAL | Status: DC | PRN

## 2015-08-14 MED ORDER — LIDOCAINE HCL (CARDIAC) 20 MG/ML IV SOLN
INTRAVENOUS | Status: DC | PRN
  Administered 2015-08-14: 75 mg via INTRAVENOUS

## 2015-08-14 MED ORDER — CHLORHEXIDINE GLUCONATE 4 % EX LIQD: 60.0000 mL | Freq: Once | CUTANEOUS | Status: DC

## 2015-08-14 MED ORDER — FENTANYL CITRATE (PF) 250 MCG/5ML IJ SOLN
INTRAMUSCULAR | Status: AC
  Filled 2015-08-14: qty 25

## 2015-08-14 MED ORDER — HYDROMORPHONE HCL 1 MG/ML IJ SOLN
0.2500 mg | INTRAMUSCULAR | Status: DC | PRN
  Administered 2015-08-14 (×4): 0.5 mg via INTRAVENOUS

## 2015-08-14 MED ORDER — MIRABEGRON ER 25 MG PO TB24
25.0000 mg | ORAL_TABLET | Freq: Every day | ORAL | Status: DC
  Administered 2015-08-14: 25 mg via ORAL
  Filled 2015-08-14 (×2): qty 1

## 2015-08-14 MED ORDER — ONDANSETRON HCL 4 MG/2ML IJ SOLN
INTRAMUSCULAR | Status: AC
  Filled 2015-08-14: qty 2

## 2015-08-14 MED ORDER — ACETAMINOPHEN 10 MG/ML IV SOLN
1000.0000 mg | Freq: Once | INTRAVENOUS | Status: AC
  Administered 2015-08-14: 1000 mg via INTRAVENOUS

## 2015-08-14 MED ORDER — GABAPENTIN 300 MG PO CAPS
600.0000 mg | ORAL_CAPSULE | Freq: Every day | ORAL | Status: DC
  Administered 2015-08-14 – 2015-08-15 (×6): 600 mg via ORAL
  Filled 2015-08-14 (×11): qty 2

## 2015-08-14 MED ORDER — CEFAZOLIN SODIUM-DEXTROSE 2-3 GM-% IV SOLR
INTRAVENOUS | Status: AC
  Filled 2015-08-14: qty 50

## 2015-08-14 MED ORDER — BUPIVACAINE HCL (PF) 0.25 % IJ SOLN
INTRAMUSCULAR | Status: AC
  Filled 2015-08-14: qty 30

## 2015-08-14 MED ORDER — ONDANSETRON HCL 4 MG/2ML IJ SOLN: 4.0000 mg | Freq: Four times a day (QID) | INTRAMUSCULAR | Status: DC | PRN

## 2015-08-14 MED ORDER — DEXAMETHASONE SODIUM PHOSPHATE 10 MG/ML IJ SOLN
10.0000 mg | Freq: Once | INTRAMUSCULAR | Status: AC
  Administered 2015-08-15: 10 mg via INTRAVENOUS
  Filled 2015-08-14: qty 1

## 2015-08-14 MED ORDER — DEXAMETHASONE SODIUM PHOSPHATE 10 MG/ML IJ SOLN: 10.0000 mg | Freq: Once | INTRAMUSCULAR | Status: DC

## 2015-08-14 MED ORDER — POLYETHYLENE GLYCOL 3350 17 G PO PACK: 17.0000 g | PACK | Freq: Every day | ORAL | Status: DC | PRN

## 2015-08-14 MED ORDER — FENTANYL CITRATE (PF) 100 MCG/2ML IJ SOLN
INTRAMUSCULAR | Status: DC | PRN
Start: 2015-08-14 — End: 2015-08-14
  Administered 2015-08-14: 50 ug via INTRAVENOUS
  Administered 2015-08-14 (×2): 25 ug via INTRAVENOUS
  Administered 2015-08-14: 100 ug via INTRAVENOUS

## 2015-08-14 MED ORDER — TRAMADOL HCL 50 MG PO TABS: 50.0000 mg | ORAL_TABLET | Freq: Four times a day (QID) | ORAL | Status: DC | PRN

## 2015-08-14 MED ORDER — MENTHOL 3 MG MT LOZG: 1.0000 | LOZENGE | OROMUCOSAL | Status: DC | PRN

## 2015-08-14 MED ORDER — TRANEXAMIC ACID 1000 MG/10ML IV SOLN
2000.0000 mg | Freq: Once | INTRAVENOUS | Status: DC
  Filled 2015-08-14: qty 20

## 2015-08-14 MED ORDER — SODIUM CHLORIDE 0.9 % IV SOLN: INTRAVENOUS | Status: DC

## 2015-08-14 MED ORDER — PIOGLITAZONE HCL 30 MG PO TABS
30.0000 mg | ORAL_TABLET | Freq: Every day | ORAL | Status: DC
  Filled 2015-08-14: qty 1

## 2015-08-14 MED ORDER — MIDAZOLAM HCL 2 MG/2ML IJ SOLN
INTRAMUSCULAR | Status: AC
  Filled 2015-08-14: qty 4

## 2015-08-14 MED ORDER — DIPHENHYDRAMINE HCL 12.5 MG/5ML PO ELIX: 12.5000 mg | ORAL_SOLUTION | ORAL | Status: DC | PRN

## 2015-08-14 MED ORDER — NEOSTIGMINE METHYLSULFATE 10 MG/10ML IV SOLN
INTRAVENOUS | Status: DC | PRN
  Administered 2015-08-14: 1 mg via INTRAVENOUS

## 2015-08-14 MED ORDER — LACTATED RINGERS IV SOLN
INTRAVENOUS | Status: DC | PRN
  Administered 2015-08-14 (×2): via INTRAVENOUS

## 2015-08-14 SURGICAL SUPPLY — 44 items
BAG DECANTER FOR FLEXI CONT (MISCELLANEOUS) ×2 IMPLANT
BAG SPEC THK2 15X12 ZIP CLS (MISCELLANEOUS)
BAG ZIPLOCK 12X15 (MISCELLANEOUS) IMPLANT
BLADE EXTENDED COATED 6.5IN (ELECTRODE) ×2 IMPLANT
BLADE SAG 18X100X1.27 (BLADE) ×2 IMPLANT
CAPT HIP TOTAL 2 ×1 IMPLANT
CATH COUDE 5CC RIBBED (CATHETERS) IMPLANT
CATH RIBBED COUDE 5CC (CATHETERS) ×2
COVER PERINEAL POST (MISCELLANEOUS) ×2 IMPLANT
DECANTER SPIKE VIAL GLASS SM (MISCELLANEOUS) ×2 IMPLANT
DRAPE C-ARM 42X120 X-RAY (DRAPES) ×2 IMPLANT
DRAPE STERI IOBAN 125X83 (DRAPES) ×2 IMPLANT
DRAPE U-SHAPE 47X51 STRL (DRAPES) ×6 IMPLANT
DRSG ADAPTIC 3X8 NADH LF (GAUZE/BANDAGES/DRESSINGS) ×2 IMPLANT
DRSG MEPILEX BORDER 4X4 (GAUZE/BANDAGES/DRESSINGS) ×2 IMPLANT
DRSG MEPILEX BORDER 4X8 (GAUZE/BANDAGES/DRESSINGS) ×2 IMPLANT
DURAPREP 26ML APPLICATOR (WOUND CARE) ×2 IMPLANT
ELECT REM PT RETURN 9FT ADLT (ELECTROSURGICAL) ×2
ELECTRODE REM PT RTRN 9FT ADLT (ELECTROSURGICAL) ×1 IMPLANT
EVACUATOR 1/8 PVC DRAIN (DRAIN) ×2 IMPLANT
FACESHIELD WRAPAROUND (MASK) ×8 IMPLANT
FACESHIELD WRAPAROUND OR TEAM (MASK) ×4 IMPLANT
GLOVE BIO SURGEON STRL SZ7.5 (GLOVE) ×2 IMPLANT
GLOVE BIO SURGEON STRL SZ8 (GLOVE) ×4 IMPLANT
GLOVE BIOGEL PI IND STRL 8 (GLOVE) ×2 IMPLANT
GLOVE BIOGEL PI INDICATOR 8 (GLOVE) ×2
GOWN STRL REUS W/TWL LRG LVL3 (GOWN DISPOSABLE) ×2 IMPLANT
GOWN STRL REUS W/TWL XL LVL3 (GOWN DISPOSABLE) ×2 IMPLANT
KIT BASIN OR (CUSTOM PROCEDURE TRAY) ×2 IMPLANT
NDL SAFETY ECLIPSE 18X1.5 (NEEDLE) ×1 IMPLANT
NEEDLE HYPO 18GX1.5 SHARP (NEEDLE) ×2
PACK TOTAL JOINT (CUSTOM PROCEDURE TRAY) ×2 IMPLANT
PEN SKIN MARKING BROAD (MISCELLANEOUS) ×2 IMPLANT
STRIP CLOSURE SKIN 1/2X4 (GAUZE/BANDAGES/DRESSINGS) ×2 IMPLANT
SUT ETHIBOND NAB CT1 #1 30IN (SUTURE) ×2 IMPLANT
SUT MNCRL AB 4-0 PS2 18 (SUTURE) ×2 IMPLANT
SUT VIC AB 2-0 CT1 27 (SUTURE) ×4
SUT VIC AB 2-0 CT1 TAPERPNT 27 (SUTURE) ×2 IMPLANT
SUT VLOC 180 0 24IN GS25 (SUTURE) ×2 IMPLANT
SYR 30ML LL (SYRINGE) ×2 IMPLANT
SYR 50ML LL SCALE MARK (SYRINGE) IMPLANT
TOWEL OR 17X26 10 PK STRL BLUE (TOWEL DISPOSABLE) ×2 IMPLANT
TOWEL OR NON WOVEN STRL DISP B (DISPOSABLE) IMPLANT
TRAY FOLEY W/METER SILVER 16FR (SET/KITS/TRAYS/PACK) ×2 IMPLANT

## 2015-08-14 NOTE — Anesthesia Postprocedure Evaluation (Signed)
  Anesthesia Post-op Note  Patient: Manuel [name]  Procedure(s) Performed: Procedure(s) (LRB): LEFT TOTAL HIP ARTHROPLASTY ANTERIOR APPROACH (Left)  Patient Location: PACU  Anesthesia Type: General  Level of Consciousness: awake and alert   Airway and Oxygen Therapy: Patient Spontanous Breathing  Post-op Pain: mild  Post-op Assessment: Post-op Vital signs reviewed, Patient's Cardiovascular Status Stable, Respiratory Function Stable, Patent Airway and No signs of Nausea or vomiting  Last Vitals:  Filed Vitals:   08/14/15 1115  BP: 162/48  Pulse: 61  Temp: 36.4 C  Resp: 14    Post-op Vital Signs: stable   Complications: No apparent anesthesia complications

## 2015-08-14 NOTE — Transfer of Care (Signed)
Immediate Anesthesia Transfer of Care Note  Patient: Manuel [name]  Procedure(s) Performed: Procedure(s): LEFT TOTAL HIP ARTHROPLASTY ANTERIOR APPROACH (Left)  Patient Location: PACU  Anesthesia Type:General  Level of Consciousness: awake, oriented, patient cooperative, lethargic and responds to stimulation  Airway & Oxygen Therapy: Patient Spontanous Breathing and Patient connected to face mask oxygen  Post-op Assessment: Report given to RN, Post -op Vital signs reviewed and stable and Patient moving all extremities  Post vital signs: Reviewed and stable  Last Vitals:  Filed Vitals:   08/14/15 0710  BP: 125/55  Pulse:   Temp:   Resp:     Complications: No apparent anesthesia complications

## 2015-08-14 NOTE — Op Note (Signed)
OPERATIVE REPORT  PREOPERATIVE DIAGNOSIS: Osteoarthritis of the Left hip.   POSTOPERATIVE DIAGNOSIS: Osteoarthritis of the Left  hip.   PROCEDURE: Left total hip arthroplasty, anterior approach.   SURGEON: Gaynelle Arabian, MD   ASSISTANT: Molli Barrows, PA-C  ANESTHESIA:  General  ESTIMATED BLOOD LOSS:-250 ml    DRAINS: Hemovac x1.   COMPLICATIONS: None   CONDITION: PACU - hemodynamically stable.   BRIEF CLINICAL NOTE: Manuel [name] is a 80 y.o. male who has advanced end-  stage arthritis of his Right  hip with progressively worsening pain and  dysfunction.The patient has failed nonoperative management and presents for  total hip arthroplasty.   PROCEDURE IN DETAIL: After successful administration of spinal  anesthetic, the traction boots for the [facility] bed were placed on both  feet and the patient was placed onto the [facility] bed, boots placed into the leg  holders. The Right hip was then isolated from the perineum with plastic  drapes and prepped and draped in the usual sterile fashion. ASIS and  greater trochanter were marked and a oblique incision was made, starting  at about 1 cm lateral and 2 cm distal to the ASIS and coursing towards  the anterior cortex of the femur. The skin was cut with a 10 blade  through subcutaneous tissue to the level of the fascia overlying the  tensor fascia lata muscle. The fascia was then incised in line with the  incision at the junction of the anterior third and posterior 2/3rd. The  muscle was teased off the fascia and then the interval between the TFL  and the rectus was developed. The Hohmann retractor was then placed at  the top of the femoral neck over the capsule. The vessels overlying the  capsule were cauterized and the fat on top of the capsule was removed.  A Hohmann retractor was then placed anterior underneath the rectus  femoris to give exposure to the entire anterior capsule. A T-shaped  capsulotomy was performed. The  edges were tagged and the femoral head  was identified.       Osteophytes are removed off the superior acetabulum.  The femoral neck was then cut in situ with an oscillating saw. Traction  was then applied to the left lower extremity utilizing the [facility]  traction. The femoral head was then removed. Retractors were placed  around the acetabulum and then circumferential removal of the labrum was  performed. Osteophytes were also removed. Reaming starts at 47 mm to  medialize and  Increased in 2 mm increments to 53 mm. We reamed in  approximately 40 degrees of abduction, 20 degrees anteversion. A 54 mm  pinnacle acetabular shell was then impacted in anatomic position under  fluoroscopic guidance with excellent purchase. We did not need to place  any additional dome screws. A 36 mm neutral + 4 marathon liner was then  placed into the acetabular shell.       The femoral lift was then placed along the lateral aspect of the femur  just distal to the vastus ridge. The leg was  externally rotated and capsule  was stripped off the inferior aspect of the femoral neck down to the  level of the lesser trochanter, this was done with electrocautery. The femur was lifted after this was performed. The  leg was then placed and extended in adducted position to essentially delivering the femur. We also removed the capsule superiorly and the  piriformis from the  piriformis fossa to gain excellent exposure of the  proximal femur. Rongeur was used to remove some cancellous bone to get  into the lateral portion of the proximal femur for placement of the  initial starter reamer. The starter broaches was placed  the starter broach  and was shown to go down the center of the canal. Broaching  with the  Corail system was then performed starting at size 8, coursing  Up to size 13. A size 13 had excellent torsional and rotational  and axial stability. The trial standard offset neck was then placed  with a 36 + 5 trial  head. The hip was then reduced. We confirmed that  the stem was in the canal both on AP and lateral x-rays. It also has excellent sizing. The hip was reduced with outstanding stability through full extension, full external rotation,  and then flexion in adduction internal rotation. AP pelvis was taken  and the leg lengths were measured and found to be exactly equal. Hip  was then dislocated again and the femoral head and neck removed. The  femoral broach was removed. Size 13 Corail stem with a standard offset  neck was then impacted into the femur following native anteversion. Has  excellent purchase in the canal. Excellent torsional and rotational and  axial stability. It is confirmed to be in the canal on AP and lateral  fluoroscopic views. The 36 + 5 ceramic head was placed and the hip  reduced with outstanding stability. Again AP pelvis was taken and it  confirmed that the leg lengths were equal. The wound was then copiously  irrigated with saline solution and the capsule reattached and repaired  with Ethibond suture.  20 mL of Exparel mixed with 50 mL of saline then additional 20 ml of .25% Bupivicaine injected into the capsule and into the edge of the tensor fascia lata as well as subcutaneous tissue. The fascia overlying the tensor fascia lata was  then closed with a running #1 V-Loc. Subcu was closed with interrupted  2-0 Vicryl and subcuticular running 4-0 Monocryl. Incision was cleaned  and dried. Steri-Strips and a bulky sterile dressing applied. Hemovac  drain was hooked to suction and then he was awakened and transported to  recovery in stable condition.        Please note that a surgical assistant was a medical necessity for this procedure to perform it in a safe and expeditious manner. Assistant was necessary to provide appropriate retraction of vital neurovascular structures and to prevent femoral fracture and allow for anatomic placement of the prosthesis.  Gaynelle Arabian, M.D.

## 2015-08-14 NOTE — Anesthesia Procedure Notes (Signed)
Procedure Name: Intubation Date/Time: 08/14/2015 8:37 AM Performed by: Ofilia Neas Pre-anesthesia Checklist: Patient identified, Timeout performed, Emergency Drugs available, Suction available and Patient being monitored Patient Re-evaluated:Patient Re-evaluated prior to inductionOxygen Delivery Method: Circle system utilized Preoxygenation: Pre-oxygenation with 100% oxygen Intubation Type: IV induction and Cricoid Pressure applied Ventilation: Mask ventilation without difficulty Laryngoscope Size: Mac and 4 Grade View: Grade II Tube type: Oral Tube size: 7.5 mm Number of attempts: 1 Airway Equipment and Method: Stylet Placement Confirmation: ETT inserted through vocal cords under direct vision,  positive ETCO2 and breath sounds checked- equal and bilateral Secured at: 21 cm Tube secured with: Tape Dental Injury: Teeth and Oropharynx as per pre-operative assessment  Difficulty Due To: Difficulty was anticipated, Difficult Airway- due to large tongue, Difficult Airway- due to reduced neck mobility, Difficult Airway- due to dentition and Difficult Airway- due to limited oral opening

## 2015-08-14 NOTE — Progress Notes (Signed)
Utilization review completed.  

## 2015-08-14 NOTE — Progress Notes (Signed)
RT placed pt on auto titrate CPAP 5-20cmH2O with 2Lpm of oxygen bled in via home ffm. Pt states he is comfortable and is tolerating CPAP well at this time. RT will continue to monitor as needed.

## 2015-08-14 NOTE — Evaluation (Signed)
Physical Therapy Evaluation Patient Details Name: Manuel [name] MRN: [id number] DOB: [birth year] Today's Date: 08/14/2015   History of Present Illness  DATHA L  Clinical Impression  Patient ambulated x 155'. No c/o pain patient will benefit from PT to address problems listed in note below.    Follow Up Recommendations Home health PT;Supervision/Assistance - 24 hour    Equipment Recommendations  None recommended by PT    Recommendations for Other Services       Precautions / Restrictions Precautions Precautions: Fall      Mobility  Bed Mobility Overal bed mobility: Needs Assistance Bed Mobility: Supine to Sit;Sit to Supine     Supine to sit: Min assist Sit to supine: Min assist   General bed mobility comments: assist with l leg  Transfers Overall transfer level: Needs assistance Equipment used: Rolling walker (2 wheeled) Transfers: Sit to/from Stand           General transfer comment: cues for hand and LLE position  Ambulation/Gait Ambulation/Gait assistance: Min assist Ambulation Distance (Feet): 155 Feet Assistive device: Rolling walker (2 wheeled) Gait Pattern/deviations: Step-to pattern;Step-through pattern     General Gait Details: cues for sequence, not even antalgic, smoothe gait.  Stairs            Wheelchair Mobility    Modified Rankin (Stroke Patients Only)       Balance                                             Pertinent Vitals/Pain Pain Assessment: No/denies pain    Home Living Family/patient expects to be discharged to:: Private residence Living Arrangements: Spouse/significant other Available Help at Discharge: Family;Friend(s);Available 24 hours/day Type of Home: House Home Access: Stairs to enter   [employer] of Steps: 1   Home Equipment: Walker - 2 wheels;Grab bars - toilet      Prior Function Level of Independence: Independent               Hand Dominance         Extremity/Trunk Assessment               Lower Extremity Assessment: LLE deficits/detail   LLE Deficits / Details: advances without problem  Cervical / Trunk Assessment: Normal  Communication   Communication: No difficulties  Cognition Arousal/Alertness: Awake/alert Behavior During Therapy: WFL for tasks assessed/performed Overall Cognitive Status: Within Functional Limits for tasks assessed                      General Comments      Exercises        Assessment/Plan    PT Assessment Patient needs continued PT services  PT Diagnosis Difficulty walking   PT Problem List Decreased strength;Decreased range of motion;Decreased activity tolerance;Decreased mobility;Decreased knowledge of precautions;Decreased safety awareness;Decreased knowledge of use of DME  PT Treatment Interventions DME instruction;Gait training;Stair training;Functional mobility training;Therapeutic activities;Therapeutic exercise;Patient/family education   PT Goals (Current goals can be found in the Care Plan section) Acute Rehab PT Goals Patient Stated Goal: to walk without pain PT Goal Formulation: With patient/family Time For Goal Achievement: 08/17/15 Potential to Achieve Goals: Good    Frequency 7X/week   Barriers to discharge        Co-evaluation               End of Session Equipment  Utilized During Treatment: Gait belt Activity Tolerance: Patient tolerated treatment well Patient left: with call bell/phone within reach;with bed alarm set Nurse Communication: Mobility status         Time: 2229-7989 PT Time Calculation (min) (ACUTE ONLY): 19 min   Charges:   PT Evaluation $Initial PT Evaluation Tier I: 1 Procedure     PT G CodesClaretha Cooper 08/14/2015, 5:04 PM

## 2015-08-14 NOTE — Anesthesia Preprocedure Evaluation (Signed)
Anesthesia Evaluation  Patient identified by MRN, date of birth, ID band Patient awake    Reviewed: Allergy & Precautions, NPO status , Patient's Chart, lab work & pertinent test results  Airway Mallampati: II  TM Distance: >3 FB Neck ROM: Full    Dental no notable dental hx.    Pulmonary sleep apnea , former smoker,  breath sounds clear to auscultation  Pulmonary exam normal       Cardiovascular hypertension, Pt. on medications Normal cardiovascular examRhythm:Regular Rate:Normal     Neuro/Psych negative neurological ROS  negative psych ROS   GI/Hepatic negative GI ROS, Neg liver ROS,   Endo/Other  negative endocrine ROSdiabetes  Renal/GU Renal InsufficiencyRenal disease  negative genitourinary   Musculoskeletal negative musculoskeletal ROS (+)   Abdominal   Peds negative pediatric ROS (+)  Hematology negative hematology ROS (+)   Anesthesia Other Findings   Reproductive/Obstetrics negative OB ROS                             Anesthesia Physical Anesthesia Plan  ASA: III  Anesthesia Plan: General   Post-op Pain Management:    Induction: Intravenous  Airway Management Planned: Oral ETT  Additional Equipment:   Intra-op Plan:   Post-operative Plan: Extubation in OR  Informed Consent: I have reviewed the patients History and Physical, chart, labs and discussed the procedure including the risks, benefits and alternatives for the proposed anesthesia with the patient or authorized representative who has indicated his/her understanding and acceptance.   Dental advisory given  Plan Discussed with: CRNA and Surgeon  Anesthesia Plan Comments:         Anesthesia Quick Evaluation

## 2015-08-15 ENCOUNTER — Encounter (HOSPITAL_COMMUNITY): Payer: Self-pay | Admitting: Orthopedic Surgery

## 2015-08-15 LAB — BASIC METABOLIC PANEL
ANION GAP: 6 (ref 5–15)
BUN: 33 mg/dL — ABNORMAL HIGH (ref 6–20)
CALCIUM: 8.2 mg/dL — AB (ref 8.9–10.3)
CHLORIDE: 104 mmol/L (ref 101–111)
CO2: 28 mmol/L (ref 22–32)
CREATININE: 2.2 mg/dL — AB (ref 0.61–1.24)
GFR calc non Af Amer: 27 mL/min — ABNORMAL LOW (ref >60)
GFR, EST AFRICAN AMERICAN: 31 mL/min — AB (ref >60)
Glucose, Bld: 220 mg/dL — ABNORMAL HIGH (ref 65–99)
Potassium: 4.9 mmol/L (ref 3.5–5.1)
SODIUM: 138 mmol/L (ref 135–145)

## 2015-08-15 LAB — CBC
HCT: 29.8 % — ABNORMAL LOW (ref 39.0–52.0)
HEMOGLOBIN: 9.1 g/dL — AB (ref 13.0–17.0)
MCH: 29.4 pg (ref 26.0–34.0)
MCHC: 30.5 g/dL (ref 30.0–36.0)
MCV: 96.1 fL (ref 78.0–100.0)
Platelets: 144 10*3/uL — ABNORMAL LOW (ref 150–400)
RBC: 3.1 MIL/uL — ABNORMAL LOW (ref 4.22–5.81)
RDW: 14.6 % (ref 11.5–15.5)
WBC: 4.8 10*3/uL (ref 4.0–10.5)

## 2015-08-15 LAB — GLUCOSE, CAPILLARY
Glucose-Capillary: 185 mg/dL — ABNORMAL HIGH (ref 65–99)
Glucose-Capillary: 158 mg/dL — ABNORMAL HIGH (ref 65–99)

## 2015-08-15 MED ORDER — APIXABAN 2.5 MG PO TABS: 2.5000 mg | ORAL_TABLET | Freq: Two times a day (BID) | ORAL | Status: DC

## 2015-08-15 MED ORDER — METHOCARBAMOL 500 MG PO TABS: 500.0000 mg | ORAL_TABLET | Freq: Four times a day (QID) | ORAL | Status: DC | PRN

## 2015-08-15 MED ORDER — TRAMADOL HCL 50 MG PO TABS: 50.0000 mg | ORAL_TABLET | Freq: Four times a day (QID) | ORAL | Status: DC | PRN

## 2015-08-15 MED ORDER — SODIUM CHLORIDE 0.9 % IV BOLUS (SEPSIS)
250.0000 mL | Freq: Once | INTRAVENOUS | Status: AC
  Administered 2015-08-15: 250 mL via INTRAVENOUS

## 2015-08-15 MED ORDER — HYDROMORPHONE HCL 2 MG PO TABS: 2.0000 mg | ORAL_TABLET | ORAL | Status: DC | PRN

## 2015-08-15 NOTE — Progress Notes (Addendum)
   Subjective: 1 Day Post-Op Procedure(s) (LRB): LEFT TOTAL HIP ARTHROPLASTY ANTERIOR APPROACH (Left) Patient reports pain as mild.   Patient seen in rounds with Dr. Wynelle Link. Patient is well, and has had no acute complaints or problems. No issues overnight. Reports pain under good control. Had some issues with hypotension this morning. We will start therapy today.  Plan is to go Home after hospital stay.  Objective: Vital signs in last 24 hours: Temp:  [97.5 F (36.4 C)-99.4 F (37.4 C)] 99.4 F (37.4 C) (08/18 0534) Pulse Rate:  [53-73] 59 (08/18 0534) Resp:  [14-18] 17 (08/18 0534) BP: (92-177)/(42-69) 99/45 mmHg (08/18 0534) SpO2:  [96 %-100 %] 96 % (08/18 0534)  Intake/Output from previous day:  Intake/Output Summary (Last 24 hours) at 08/15/15 0737 Last data filed at 08/15/15 0653  Gross per 24 hour  Intake   5315 ml  Output   2120 ml  Net   3195 ml     Labs:  Recent Labs  08/15/15 0440  HGB 9.1*    Recent Labs  08/15/15 0440  WBC 4.8  RBC 3.10*  HCT 29.8*  PLT 144*    Recent Labs  08/15/15 0440  NA 138  K 4.9  CL 104  CO2 28  BUN 33*  CREATININE 2.20*  GLUCOSE 220*  CALCIUM 8.2*    EXAM General - Patient is Alert and Oriented Extremity - Neurologically intact Intact pulses distally Dorsiflexion/Plantar flexion intact Compartment soft Dressing - dressing C/D/I Motor Function - intact, moving foot and toes well on exam.  Hemovac pulled without difficulty.  Past Medical History  Diagnosis Date  . Hypertension   . Back pain   . Cancer     PROSTATE -- RADIATION SEED IMPLANT  . Diabetes mellitus     DX SINCE 1998  . Heart murmur   . Sleep apnea   . Arthritis     Assessment/Plan: 1 Day Post-Op Procedure(s) (LRB): LEFT TOTAL HIP ARTHROPLASTY ANTERIOR APPROACH (Left) Active Problems:   OA (osteoarthritis) of hip  Estimated body mass index is 32.59 kg/(m^2) as calculated from the following:   Height as of this encounter: 6\' 1"   (1.854 m).   Weight as of this encounter: 112.038 kg (247 lb). Advance diet Up with therapy D/C IV fluids Discharge home with home health  DVT Prophylaxis - resume Eliquis Weight Bearing As Tolerated  D/C Knee Immobilizer Hemovac Pulled Begin Therapy   He is doing well. Will work with PT today. Plan for DC home this afternoon with HHPT if responds well to bolus and BP stabilizes.   Ardeen Jourdain, PA-C Orthopaedic Surgery 08/15/2015, 7:37 AM

## 2015-08-15 NOTE — Progress Notes (Signed)
Physical Therapy Treatment Patient Details Name: SHILOH SWOPES MRN: [id number] DOB: [birth year] Today's Date: 08/15/2015    History of Present Illness DATHA L    PT Comments    Patient is mobilizing well. Ready for DC.  Follow Up Recommendations  Home health PT;Supervision/Assistance - 24 hour     Equipment Recommendations       Recommendations for Other Services       Precautions / Restrictions Precautions Precautions: Fall    Mobility  Bed Mobility Overal bed mobility: Needs Assistance Bed Mobility: Rolling;Sidelying to Sit;Supine to Sit;Sit to Supine Rolling: Min assist Sidelying to sit: Mod assist Supine to sit: Mod assist Sit to supine: Min guard   General bed mobility comments: with HOB flat, but required mod A to lift trunk from bed due to weakness.   recommened that pt sleep in recliner until HHPT addresses this at home   Transfers Overall transfer level: Needs assistance Equipment used: Rolling walker (2 wheeled) Transfers: Sit to/from [employer] Sit to Stand: Min guard Stand pivot transfers: Min guard       General transfer comment: verbal ues for hand placement and technique and to control descent when moving standing to sitting   Ambulation/Gait Ambulation/Gait assistance: Min guard Ambulation Distance (Feet): 100 Feet Assistive device: Rolling walker (2 wheeled) Gait Pattern/deviations: Step-to pattern     General Gait Details: cues for sequence, not even antalgic, smoothe gait.   Stairs Stairs: Yes Stairs assistance: Min assist Stair Management: Step to pattern;Forwards;With walker Number of Stairs: 1 General stair comments: wife present for instrtruction  Wheelchair Mobility    Modified Rankin (Stroke Patients Only)       Balance           Standing balance support: During functional activity Standing balance-Leahy Scale: Fair                      Cognition Arousal/Alertness: Awake/alert Behavior  During Therapy: WFL for tasks assessed/performed Overall Cognitive Status: Within Functional Limits for tasks assessed                      Exercises      General Comments General comments (skin integrity, edema, etc.): reviewed safety with ADLs with pt and spouse       Pertinent Vitals/Pain Pain Assessment: No/denies pain Pain Score: 4  Pain Location: Lt thigh Pain Descriptors / Indicators: Aching;Spasm Pain Intervention(s): Monitored during session;Ice applied    Home Living Family/patient expects to be discharged to:: Private residence Living Arrangements: Spouse/significant other Available Help at Discharge: Family;Friend(s);Available 24 hours/day Type of Home: House Home Access: Stairs to enter     Home Equipment: Walker - 2 wheels;Grab bars - toilet;Bedside commode;Adaptive equipment      Prior Function Level of Independence: Independent with assistive device(s)      Comments: Pt has been using AE for LB dressing for since back surgeries    PT Goals (current goals can now be found in the care plan section) Acute Rehab PT Goals Patient Stated Goal: to go home  Progress towards PT goals: Progressing toward goals    Frequency       PT Plan Current plan remains appropriate    Co-evaluation             End of Session   Activity Tolerance: Patient tolerated treatment well Patient left: in chair;with call bell/phone within reach     Time: 1353-1420 PT Time Calculation (min) (ACUTE  ONLY): 27 min  Charges:  $Gait Training: 23-37 mins                    G Codes:      Claretha Cooper 08/15/2015, 4:56 PM

## 2015-08-15 NOTE — Progress Notes (Signed)
Physical Therapy Treatment Patient Details Name: Manuel [name] MRN: [id number] DOB: July 01, 1935 Today's Date: 08/15/2015    History of Present Illness DATHA L    PT Comments    BP 137/51 after walking. No dizziness. Will see second treatment for step. Plans Dc today.  Follow Up Recommendations  Home health PT;Supervision/Assistance - 24 hour     Equipment Recommendations  None recommended by PT    Recommendations for Other Services       Precautions / Restrictions Precautions Precautions: Fall    Mobility  Bed Mobility   Bed Mobility: Supine to Sit     Supine to sit: Min assist     General bed mobility comments: assist with trunk, gets legs over, recommended sleep in recliner first night or wife to assist  Transfers   Equipment used: Rolling walker (2 wheeled) Transfers: Sit to/from Stand           General transfer comment: cues for hand and LLE position  Ambulation/Gait Ambulation/Gait assistance: Min guard Ambulation Distance (Feet): 400 Feet   Gait Pattern/deviations: Step-to pattern;Step-through pattern   Gait velocity interpretation: at or above normal speed for age/gender     Stairs            Wheelchair Mobility    Modified Rankin (Stroke Patients Only)       Balance Overall balance assessment: Needs assistance         Standing balance support: During functional activity;Bilateral upper extremity supported Standing balance-Leahy Scale: Fair                      Cognition Arousal/Alertness: Awake/alert                          Exercises Total Joint Exercises Ankle Circles/Pumps: AROM;Both;10 reps;Supine Quad Sets: AROM;Both;10 reps;Supine Short Arc Quad: AROM;Left;10 reps;Supine Heel Slides: AROM;Left;10 reps;Supine Hip ABduction/ADduction: AROM;Left;10 reps;Supine Long Arc Quad: AROM;Left;10 reps;Supine Other Exercises Other Exercises: seated hip flexion    General Comments        Pertinent  Vitals/Pain Pain Score: 2  Pain Location: L thigh Pain Descriptors / Indicators: Sore Pain Intervention(s): Premedicated before session;Ice applied    Home Living                      Prior Function            PT Goals (current goals can now be found in the care plan section) Progress towards PT goals: Progressing toward goals    Frequency  7X/week    PT Plan Current plan remains appropriate    Co-evaluation             End of Session Equipment Utilized During Treatment: Gait belt Activity Tolerance: Patient tolerated treatment well Patient left: with call bell/phone within reach;with bed alarm set;in chair     Time: 4742-5956 PT Time Calculation (min) (ACUTE ONLY): 55 min  Charges:  $Gait Training: 8-22 mins $Therapeutic Exercise: 8-22 mins $Self Care/Home Management: 8-22                    G Codes:      Claretha Cooper 08/15/2015, 11:24 AM

## 2015-08-15 NOTE — Discharge Instructions (Addendum)
INSTRUCTIONS AFTER JOINT REPLACEMENT   Remove items at home which could result in a fall. This includes throw rugs or furniture in walking pathways ICE to the affected joint every three hours while awake for 30 minutes at a time, for at least the first 3-5 days, and then as needed for pain and swelling.  Continue to use ice for pain and swelling. You may notice swelling that will progress down to the foot and ankle.  This is normal after surgery.  Elevate your leg when you are not up walking on it.   Continue to use the breathing machine you got in the hospital (incentive spirometer) which will help keep your temperature down.  It is common for your temperature to cycle up and down following surgery, especially at night when you are not up moving around and exerting yourself.  The breathing machine keeps your lungs expanded and your temperature down.   DIET:  As you were doing prior to hospitalization, we recommend a well-balanced diet.  DRESSING / WOUND CARE / SHOWERING  You may change the dressing every day with sterile gauze.  Please use good hand washing techniques before changing the dressing.  Do not use any lotions or creams on the incision until instructed by your surgeon.  ACTIVITY  Increase activity slowly as tolerated, but follow the weight bearing instructions below.   No driving for 6 weeks or until further direction given by your physician.  You cannot drive while taking narcotics.  No lifting or carrying greater than 10 lbs. until further directed by your surgeon. Avoid periods of inactivity such as sitting longer than an hour when not asleep. This helps prevent blood clots.  You may return to work once you are authorized by your doctor.     WEIGHT BEARING   Weight bearing as tolerated with assist device (walker, cane, etc) as directed, use it as long as suggested by your surgeon or therapist, typically at least 4-6 weeks.    A rehabilitation program following joint  replacement surgery can speed recovery and prevent re-injury in the future due to weakened muscles. Contact your doctor or a physical therapist for more information on knee rehabilitation.    CONSTIPATION  Constipation is defined medically as fewer than three stools per week and severe constipation as less than one stool per week.  Even if you have a regular bowel pattern at home, your normal regimen is likely to be disrupted due to multiple reasons following surgery.  Combination of anesthesia, postoperative narcotics, change in appetite and fluid intake all can affect your bowels.   YOU MUST use at least one of the following options; they are listed in order of increasing strength to get the job done.  They are all available over the counter, and you may need to use some, POSSIBLY even all of these options:    Drink plenty of fluids (prune juice may be helpful) and high fiber foods Colace 100 mg by mouth twice a day  Senokot for constipation as directed and as needed Dulcolax (bisacodyl), take with full glass of water  Miralax (polyethylene glycol) once or twice a day as needed.  If you have tried all these things and are unable to have a bowel movement in the first 3-4 days after surgery call either your surgeon or your primary doctor.    If you experience loose stools or diarrhea, hold the medications until you stool forms back up.  If your symptoms do not get better within  1 week or if they get worse, check with your doctor.  If you experience "the worst abdominal pain ever" or develop nausea or vomiting, please contact the office immediately for further recommendations for treatment.   ITCHING:  If you experience itching with your medications, try taking only a single pain pill, or even half a pain pill at a time.  You can also use Benadryl over the counter for itching or also to help with sleep.   TED HOSE STOCKINGS:  Use stockings on both legs until for at least 2 weeks or as directed by  physician office. They may be removed at night for sleeping.  MEDICATIONS:  See your medication summary on the After Visit Summary that nursing will review with you.  You may have some home medications which will be placed on hold until you complete the course of blood thinner medication.  It is important for you to complete the blood thinner medication as prescribed.  PRECAUTIONS:  If you experience chest pain or shortness of breath - call 911 immediately for transfer to the hospital emergency department.   If you develop a fever greater that 101 F, purulent drainage from wound, increased redness or drainage from wound, foul odor from the wound/dressing, or calf pain - CONTACT YOUR SURGEON.                                                   FOLLOW-UP APPOINTMENTS:  If you do not already have a post-op appointment, please call the office for an appointment to be seen by your surgeon.  Guidelines for how soon to be seen are listed in your After Visit Summary, but are typically between 1-4 weeks after surgery.   MAKE SURE YOU:  Understand these instructions.  Get help right away if you are not doing well or get worse.    Thank you for letting us be a part of your medical care team.  It is a privilege we respect greatly.  We hope these instructions will help you stay on track for a fast and full recovery!   Information on my medicine - ELIQUIS (apixaban)  This medication education was reviewed with me or my healthcare representative as part of my discharge preparation.  The pharmacist that spoke with me during my hospital stay was:  Clovis Riley, [facility]  Why was Eliquis prescribed for you? Eliquis was prescribed for you to reduce the risk of blood clots forming after orthopedic surgery.    What do You need to know about Eliquis? Take your Eliquis TWICE DAILY - one tablet in the morning and one tablet in the evening with or without food.  It would be best to take the dose about  the same time each day.  If you have difficulty swallowing the tablet whole please discuss with your pharmacist how to take the medication safely.  Take Eliquis exactly as prescribed by your doctor and DO NOT stop taking Eliquis without talking to the doctor who prescribed the medication.  Stopping without other medication to take the place of Eliquis may increase your risk of developing a clot.  After discharge, you should have regular check-up appointments with your healthcare provider that is prescribing your Eliquis.  What do you do if you miss a dose? If a dose of ELIQUIS is not taken at the scheduled  time, take it as soon as possible on the same day and twice-daily administration should be resumed.  The dose should not be doubled to make up for a missed dose.  Do not take more than one tablet of ELIQUIS at the same time.  Important Safety Information A possible side effect of Eliquis is bleeding. You should call your healthcare provider right away if you experience any of the following: ? Bleeding from an injury or your nose that does not stop. ? Unusual colored urine (red or dark brown) or unusual colored stools (red or black). ? Unusual bruising for unknown reasons. ? A serious fall or if you hit your head (even if there is no bleeding).  Some medicines may interact with Eliquis and might increase your risk of bleeding or clotting while on Eliquis. To help avoid this, consult your healthcare provider or pharmacist prior to using any new prescription or non-prescription medications, including herbals, vitamins, non-steroidal anti-inflammatory drugs (NSAIDs) and supplements.  This website has more information on Eliquis (apixaban): http://www.eliquis.com/eliquis/home

## 2015-08-15 NOTE — Care Management Note (Signed)
Case Management Note  Patient Details  Name: SAVON BORDONARO MRN: [id number] Date of Birth: [birth year]  Subjective/Objective:                   LEFT TOTAL HIP ARTHROPLASTY ANTERIOR APPROACH (Left) Action/Plan:   Expected Discharge Date:  08/16/15               Expected Discharge Plan:  [place]  In-House Referral:     Discharge planning Services  CM Consult  Post Acute Care Choice:  Home Health Choice offered to:  Patient  DME Arranged:    DME Agency:     HH Arranged:  PT [place]:  [place]  Status of Service:  Completed, signed off  Medicare Important Message Given:    Date Medicare IM Given:    Medicare IM give by:    Date Additional Medicare IM Given:    Additional Medicare Important Message give by:     If discussed at [place] of Stay Meetings, dates discussed:    Additional Comments: CM met with pt in room to offer choice of home health agency.  Pt chooses [facility] of [facility] to render HHPT.  Address and contact information same as last time they used [facility].  Pt has his rolling walker in room and states he has an elevated toilet with handles and does not need a 3n1.  Referral called to [facility] rep, Kristen with request for [facility].  NO other CM needs were communicated. Dellie Catholic, RN 08/15/2015, 12:45 PM

## 2015-08-15 NOTE — Evaluation (Signed)
Occupational Therapy Evaluation Patient Details Name: Manuel [name] MRN: [id number] DOB: 1935-04-02 Today's Date: 08/15/2015    History of Present Illness DATHA L   Clinical Impression   Patient evaluated by Occupational Therapy with no further acute OT needs identified. All education has been completed and the patient has no further questions. All education completed.  See below for any follow-up Occupational Therapy or equipment needs. OT is signing off. Thank you for this referral.      Follow Up Recommendations  No OT follow up;Supervision/Assistance - 24 hour    Equipment Recommendations  None recommended by OT    Recommendations for Other Services       Precautions / Restrictions Precautions Precautions: Fall      Mobility Bed Mobility Overal bed mobility: Needs Assistance Bed Mobility: Rolling;Sidelying to Sit;Supine to Sit;Sit to Supine Rolling: Min assist Sidelying to sit: Mod assist Supine to sit: Mod assist Sit to supine: Min guard   General bed mobility comments: with HOB flat, but required mod A to lift trunk from bed due to weakness.  Prolbem solved through options, but recommened that pt sleep in recliner until HHPT addresses this at home   Transfers Overall transfer level: Needs assistance Equipment used: Rolling walker (2 wheeled) Transfers: Sit to/from [facility] Sit to Stand: Min guard Stand pivot transfers: Min guard       General transfer comment: verbal ues for hand placement and technique and to control descent when moving standing to sitting     Balance Overall balance assessment: Needs assistance         Standing balance support: During functional activity Standing balance-Leahy Scale: Fair                              ADL Overall ADL's : Needs assistance/impaired Eating/Feeding: Independent;Sitting   Grooming: Wash/dry hands;Oral care;Wash/dry face;Brushing hair;Min guard;Standing   Upper Body  Bathing: Set up;Sitting   Lower Body Bathing: Min guard;Sit to/from stand;With adaptive equipment   Upper Body Dressing : Set up;Sitting   Lower Body Dressing: Min guard;With adaptive equipment;Sit to/from stand   Toilet Transfer: Min guard;Ambulation;Comfort height toilet;BSC;RW   Toileting- Water quality scientist and Hygiene: Min guard;Sit to/from stand       Functional mobility during ADLs: Min guard;Rolling walker General ADL Comments: reviewed safety with ADLs with pt and spouse      Vision     Perception     Praxis      Pertinent Vitals/Pain Pain Assessment: 0-10 Pain Score: 4  Pain Location: Lt thigh Pain Descriptors / Indicators: Aching;Spasm Pain Intervention(s): Monitored during session;Ice applied     Hand Dominance     Extremity/Trunk Assessment Upper Extremity Assessment Upper Extremity Assessment: Generalized weakness   Lower Extremity Assessment Lower Extremity Assessment: Defer to PT evaluation       Communication Communication Communication: No difficulties   Cognition Arousal/Alertness: Awake/alert Behavior During Therapy: WFL for tasks assessed/performed Overall Cognitive Status: Within Functional Limits for tasks assessed                     General Comments       Exercises   Other Exercises Other Exercises: seated hip flexion   Shoulder Instructions      Home Living Family/patient expects to be discharged to:: Private residence Living Arrangements: Spouse/significant other Available Help at Discharge: Family;Friend(s);Available 24 hours/day Type of Home: House Home Access: Stairs to enter [employer]  of Steps: 1         Bathroom Shower/Tub: Tub/shower unit;Walk-in shower   Bathroom Toilet: Handicapped height     Home Equipment: Walker - 2 wheels;Grab bars - toilet;Bedside commode;Adaptive equipment Adaptive Equipment: Reacher;Sock aid;Long-handled shoe horn;Long-handled sponge        Prior  Functioning/Environment Level of Independence: Independent with assistive device(s)        Comments: Pt has been using AE for LB dressing for since back surgeries     OT Diagnosis: Generalized weakness;Acute pain   OT Problem List:     OT Treatment/Interventions:      OT Goals(Current goals can be found in the care plan section) Acute Rehab OT Goals Patient Stated Goal: to go home  OT Goal Formulation: All assessment and education complete, DC therapy  OT Frequency:     Barriers to D/C:            Co-evaluation              End of Session Equipment Utilized During Treatment: Rolling walker Nurse Communication: Mobility status  Activity Tolerance: Patient tolerated treatment well Patient left: in bed;with call bell/phone within reach;with family/visitor present   Time: 1443-1540 OT Time Calculation (min): 42 min Charges:  OT General Charges $OT Visit: 1 Procedure OT Evaluation $Initial OT Evaluation Tier I: 1 Procedure OT Treatments $Self Care/Home Management : 8-22 mins $Therapeutic Activity: 8-22 mins G-Codes:    Margarite Vessel M 09/04/2015, 1:09 PM

## 2015-09-12 NOTE — Discharge Summary (Signed)
Physician Discharge Summary   Patient ID: Manuel [name] MRN: [id number] DOB/AGE: August 27, 1935 80 y.o.  Admit date: 08/14/2015 Discharge date: 08/15/2015  Primary Diagnosis:  Osteoarthritis of the Left hip.  Admission Diagnoses:  Past Medical History  Diagnosis Date  . Hypertension   . Back pain   . Cancer     PROSTATE -- RADIATION SEED IMPLANT  . Diabetes mellitus     DX SINCE 1998  . Heart murmur   . Sleep apnea   . Arthritis    Discharge Diagnoses:   Active Problems:   OA (osteoarthritis) of hip  Estimated body mass index is 32.59 kg/(m^2) as calculated from the following:   Height as of this encounter: $RemoveBeforeD'6\' 1"'[id number]$  (1.854 m).   Weight as of this encounter: 112.038 kg (247 lb).  Procedure(s) (LRB): LEFT TOTAL HIP ARTHROPLASTY ANTERIOR APPROACH (Left)   Consults: None  HPI: Manuel [name] is a 80 y.o. male who has advanced end-  stage arthritis of his Right hip with progressively worsening pain and  dysfunction.The patient has failed nonoperative management and presents for  total hip arthroplasty.   Laboratory Data: Admission on 08/14/2015, Discharged on 08/15/2015  Component Date Value Ref Range Status  . Glucose-Capillary 08/14/2015 87  65 - 99 mg/dL Final  . Comment 1 08/14/2015 Document in Chart   Final  . Glucose-Capillary 08/14/2015 112* 65 - 99 mg/dL Final  . Comment 1 08/14/2015 Notify RN   Final  . Comment 2 08/14/2015 Document in Chart   Final  . WBC 08/15/2015 4.8  4.0 - 10.5 K/uL Final  . RBC 08/15/2015 3.10* 4.22 - 5.81 MIL/uL Final  . Hemoglobin 08/15/2015 9.1* 13.0 - 17.0 g/dL Final  . HCT 08/15/2015 29.8* 39.0 - 52.0 % Final  . MCV 08/15/2015 96.1  78.0 - 100.0 fL Final  . MCH 08/15/2015 29.4  26.0 - 34.0 pg Final  . MCHC 08/15/2015 30.5  30.0 - 36.0 g/dL Final  . RDW 08/15/2015 14.6  11.5 - 15.5 % Final  . Platelets 08/15/2015 144* 150 - 400 K/uL Final  . Sodium 08/15/2015 138  135 - 145 mmol/L Final  . Potassium 08/15/2015 4.9  3.5 - 5.1 mmol/L  Final  . Chloride 08/15/2015 104  101 - 111 mmol/L Final  . CO2 08/15/2015 28  22 - 32 mmol/L Final  . Glucose, Bld 08/15/2015 220* 65 - 99 mg/dL Final  . BUN 08/15/2015 33* 6 - 20 mg/dL Final  . Creatinine, Ser 08/15/2015 2.20* 0.61 - 1.24 mg/dL Final  . Calcium 08/15/2015 8.2* 8.9 - 10.3 mg/dL Final  . GFR calc non Af Amer 08/15/2015 27* >60 mL/min Final  . GFR calc Af Amer 08/15/2015 31* >60 mL/min Final   Comment: (NOTE) The eGFR has been calculated using the CKD EPI equation. This calculation has not been validated in all clinical situations. eGFR's persistently <60 mL/min signify possible Chronic Kidney Disease.   . Anion gap 08/15/2015 6  5 - 15 Final  . Glucose-Capillary 08/14/2015 172* 65 - 99 mg/dL Final  . Glucose-Capillary 08/14/2015 242* 65 - 99 mg/dL Final  . Comment 1 08/14/2015 Notify RN   Final  . Comment 2 08/14/2015 Document in Chart   Final  . Glucose-Capillary 08/15/2015 185* 65 - 99 mg/dL Final  . Glucose-Capillary 08/15/2015 158* 65 - 99 mg/dL Final  Hospital Outpatient Visit on 08/08/2015  Component Date Value Ref Range Status  . aPTT 08/08/2015 28  24 - 37 seconds Final  . WBC 08/08/2015  3.8* 4.0 - 10.5 K/uL Final  . RBC 08/08/2015 3.74* 4.22 - 5.81 MIL/uL Final  . Hemoglobin 08/08/2015 11.3* 13.0 - 17.0 g/dL Final  . HCT 08/08/2015 36.1* 39.0 - 52.0 % Final  . MCV 08/08/2015 96.5  78.0 - 100.0 fL Final  . MCH 08/08/2015 30.2  26.0 - 34.0 pg Final  . MCHC 08/08/2015 31.3  30.0 - 36.0 g/dL Final  . RDW 08/08/2015 14.5  11.5 - 15.5 % Final  . Platelets 08/08/2015 175  150 - 400 K/uL Final  . Sodium 08/08/2015 143  135 - 145 mmol/L Final  . Potassium 08/08/2015 4.7  3.5 - 5.1 mmol/L Final  . Chloride 08/08/2015 107  101 - 111 mmol/L Final  . CO2 08/08/2015 29  22 - 32 mmol/L Final  . Glucose, Bld 08/08/2015 102* 65 - 99 mg/dL Final  . BUN 08/08/2015 39* 6 - 20 mg/dL Final  . Creatinine, Ser 08/08/2015 2.26* 0.61 - 1.24 mg/dL Final  . Calcium 08/08/2015  9.2  8.9 - 10.3 mg/dL Final  . Total Protein 08/08/2015 7.3  6.5 - 8.1 g/dL Final  . Albumin 08/08/2015 4.0  3.5 - 5.0 g/dL Final  . AST 08/08/2015 24  15 - 41 U/L Final  . ALT 08/08/2015 17  17 - 63 U/L Final  . Alkaline Phosphatase 08/08/2015 35* 38 - 126 U/L Final  . Total Bilirubin 08/08/2015 0.6  0.3 - 1.2 mg/dL Final  . GFR calc non Af Amer 08/08/2015 26* >60 mL/min Final  . GFR calc Af Amer 08/08/2015 30* >60 mL/min Final   Comment: (NOTE) The eGFR has been calculated using the CKD EPI equation. This calculation has not been validated in all clinical situations. eGFR's persistently <60 mL/min signify possible Chronic Kidney Disease.   . Anion gap 08/08/2015 7  5 - 15 Final  . Prothrombin Time 08/08/2015 14.1  11.6 - 15.2 seconds Final  . INR 08/08/2015 1.07  0.00 - 1.49 Final  . ABO/RH(D) 08/08/2015 O POS   Final  . Antibody Screen 08/08/2015 NEG   Final  . Sample Expiration 08/08/2015 08/17/2015   Final  . Color, Urine 08/08/2015 YELLOW  YELLOW Final  . APPearance 08/08/2015 CLEAR  CLEAR Final  . Specific Gravity, Urine 08/08/2015 1.018  1.005 - 1.030 Final  . pH 08/08/2015 5.5  5.0 - 8.0 Final  . Glucose, UA 08/08/2015 NEGATIVE  NEGATIVE mg/dL Final  . Hgb urine dipstick 08/08/2015 TRACE* NEGATIVE Final  . Bilirubin Urine 08/08/2015 NEGATIVE  NEGATIVE Final  . Ketones, ur 08/08/2015 NEGATIVE  NEGATIVE mg/dL Final  . Protein, ur 08/08/2015 30* NEGATIVE mg/dL Final  . Urobilinogen, UA 08/08/2015 1.0  0.0 - 1.0 mg/dL Final  . Nitrite 08/08/2015 NEGATIVE  NEGATIVE Final  . Leukocytes, UA 08/08/2015 SMALL* NEGATIVE Final  . MRSA, PCR 08/08/2015 NEGATIVE  NEGATIVE Final  . Staphylococcus aureus 08/08/2015 NEGATIVE  NEGATIVE Final   Comment:        The Xpert SA Assay (FDA approved for NASAL specimens in patients over 42 years of age), is one component of a comprehensive surveillance program.  Test performance has been validated by [facility] for patients greater than or  equal to 35 year old. It is not intended to diagnose infection nor to guide or monitor treatment.   . WBC, UA 08/08/2015 7-10  <3 WBC/hpf Final  . RBC / HPF 08/08/2015 0-2  <3 RBC/hpf Final  . Bacteria, UA 08/08/2015 RARE  RARE Final  . ABO/RH(D) 08/08/2015 O POS  Final     X-Rays:Dg Pelvis Portable  08/14/2015   CLINICAL DATA:  Status post left total hip replacement  EXAM: DG C-ARM 1-60 MIN - NRPT [facility]; PORTABLE PELVIS 1-2 VIEWS  COMPARISON:  April 20, 2014  FLUOROSCOPY TIME:  Fluoroscopy Time:  0 minutes 12 seconds  Number of Acquired Images:  1  FINDINGS: There are now total hip prostheses present bilaterally with the prosthetic components bilaterally appearing well-seated. No acute fracture or dislocation. There is a drain in the lateral left hip joint. There are seed implants in the prostate region. There are surgical clips in each scrotal region. There is a catheter in the mid-pelvis. There are multiple foci of atherosclerotic vascular calcification bilaterally.  IMPRESSION: Prosthetic components appear well seated on both sides. No acute fracture or dislocation. Status post prostatic seed implants.   Electronically Signed   By: Lowella Grip III M.D.   On: 08/14/2015 11:15   Dg C-arm 1-60 Min-no Report  08/14/2015   CLINICAL DATA: surgery   C-ARM 1-60 MINUTES  Fluoroscopy was utilized by the requesting physician.  No radiographic  interpretation.     EKG: Orders placed or performed during the hospital encounter of 08/08/15  . EKG 12-Lead  . EKG 12-Lead     Hospital Course: Patient was admitted to [facility] and taken to the OR and underwent the above state procedure without complications.  Patient tolerated the procedure well and was later transferred to the recovery room and then to the orthopaedic floor for postoperative care.  They were given PO and IV analgesics for pain control following their surgery.  They were given 24 hours of postoperative antibiotics of    Anti-infectives    Start     Dose/Rate Route Frequency Ordered Stop   08/14/15 1400  ceFAZolin (ANCEF) IVPB 2 g/50 mL premix     2 g 100 mL/hr over 30 Minutes Intravenous Every 6 hours 08/14/15 1141 08/14/15 2114   08/14/15 0633  ceFAZolin (ANCEF) IVPB 2 g/50 mL premix     2 g 100 mL/hr over 30 Minutes Intravenous On call to O.R. 08/14/15 [id number] 08/14/15 0839     and started on DVT prophylaxis in the form of Eliquis.   PT and OT were ordered for total hip protocol.  The patient was allowed to be WBAT with therapy. Discharge planning was consulted to help with postop disposition and equipment needs.  Patient had a good night on the evening of surgery.  They started to get up OOB with therapy on day one.  Hemovac drain was pulled without difficulty.  Patient was seen in rounds and it was felt that as long as they did well with therapy that they would be ready to go home later that same day.  They did well with therapy was ready to go home.  Diet: Cardiac diet and Diabetic diet Activity:WBAT Follow-up:in 2 weeks Disposition - Home Discharged Condition: good   Discharge Instructions    Call MD / Call 911    Complete by:  As directed   If you experience chest pain or shortness of breath, CALL 911 and be transported to the hospital emergency room.  If you develope a fever above 101 F, pus (white drainage) or increased drainage or redness at the wound, or calf pain, call your surgeon's office.     Constipation Prevention    Complete by:  As directed   Drink plenty of fluids.  Prune juice may be helpful.  You may  use a stool softener, such as Colace (over the counter) 100 mg twice a day.  Use MiraLax (over the counter) for constipation as needed.     Diet - low sodium heart healthy    Complete by:  As directed      Discharge instructions    Complete by:  As directed   INSTRUCTIONS AFTER JOINT REPLACEMENT   Remove items at home which could result in a fall. This includes throw rugs or furniture  in walking pathways ICE to the affected joint every three hours while awake for 30 minutes at a time, for at least the first 3-5 days, and then as needed for pain and swelling.  Continue to use ice for pain and swelling. You may notice swelling that will progress down to the foot and ankle.  This is normal after surgery.  Elevate your leg when you are not up walking on it.   Continue to use the breathing machine you got in the hospital (incentive spirometer) which will help keep your temperature down.  It is common for your temperature to cycle up and down following surgery, especially at night when you are not up moving around and exerting yourself.  The breathing machine keeps your lungs expanded and your temperature down.   DIET:  As you were doing prior to hospitalization, we recommend a well-balanced diet.  DRESSING / WOUND CARE / SHOWERING  You may change the dressing every day with sterile gauze.  Please use good hand washing techniques before changing the dressing.  Do not use any lotions or creams on the incision until instructed by your surgeon.  ACTIVITY  Increase activity slowly as tolerated, but follow the weight bearing instructions below.   No driving for 6 weeks or until further direction given by your physician.  You cannot drive while taking narcotics.  No lifting or carrying greater than 10 lbs. until further directed by your surgeon. Avoid periods of inactivity such as sitting longer than an hour when not asleep. This helps prevent blood clots.  You may return to work once you are authorized by your doctor.     WEIGHT BEARING   Weight bearing as tolerated with assist device (walker, cane, etc) as directed, use it as long as suggested by your surgeon or therapist, typically at least 4-6 weeks.    A rehabilitation program following joint replacement surgery can speed recovery and prevent re-injury in the future due to weakened muscles. Contact your doctor or a physical  therapist for more information on knee rehabilitation.    CONSTIPATION  Constipation is defined medically as fewer than three stools per week and severe constipation as less than one stool per week.  Even if you have a regular bowel pattern at home, your normal regimen is likely to be disrupted due to multiple reasons following surgery.  Combination of anesthesia, postoperative narcotics, change in appetite and fluid intake all can affect your bowels.   YOU MUST use at least one of the following options; they are listed in order of increasing strength to get the job done.  They are all available over the counter, and you may need to use some, POSSIBLY even all of these options:    Drink plenty of fluids (prune juice may be helpful) and high fiber foods Colace 100 mg by mouth twice a day  Senokot for constipation as directed and as needed Dulcolax (bisacodyl), take with full glass of water  Miralax (polyethylene glycol) once or twice a day  as needed.  If you have tried all these things and are unable to have a bowel movement in the first 3-4 days after surgery call either your surgeon or your primary doctor.    If you experience loose stools or diarrhea, hold the medications until you stool forms back up.  If your symptoms do not get better within 1 week or if they get worse, check with your doctor.  If you experience "the worst abdominal pain ever" or develop nausea or vomiting, please contact the office immediately for further recommendations for treatment.   ITCHING:  If you experience itching with your medications, try taking only a single pain pill, or even half a pain pill at a time.  You can also use Benadryl over the counter for itching or also to help with sleep.   TED HOSE STOCKINGS:  Use stockings on both legs until for at least 2 weeks or as directed by physician office. They may be removed at night for sleeping.  MEDICATIONS:  See your medication summary on the "After Visit  Summary" that nursing will review with you.  You may have some home medications which will be placed on hold until you complete the course of blood thinner medication.  It is important for you to complete the blood thinner medication as prescribed.  PRECAUTIONS:  If you experience chest pain or shortness of breath - call 911 immediately for transfer to the hospital emergency department.   If you develop a fever greater that 101 F, purulent drainage from wound, increased redness or drainage from wound, foul odor from the wound/dressing, or calf pain - CONTACT YOUR SURGEON.                                                   FOLLOW-UP APPOINTMENTS:  If you do not already have a post-op appointment, please call the office for an appointment to be seen by your surgeon.  Guidelines for how soon to be seen are listed in your "After Visit Summary", but are typically between 1-4 weeks after surgery.   MAKE SURE YOU:  Understand these instructions.  Get help right away if you are not doing well or get worse.    Thank you for letting us be a part of your medical care team.  It is a privilege we respect greatly.  We hope these instructions will help you stay on track for a fast and full recovery!     Increase activity slowly as tolerated    Complete by:  As directed             Medication List    STOP taking these medications        aspirin EC 81 MG tablet      TAKE these medications        apixaban 2.5 MG Tabs tablet  Commonly known as:  ELIQUIS  Take 1 tablet (2.5 mg total) by mouth every 12 (twelve) hours.     fenofibrate 160 MG tablet  Take 160 mg by mouth every morning.     gabapentin 600 MG tablet  Commonly known as:  NEURONTIN  Take 600 mg by mouth 6 (six) times daily.     glimepiride 2 MG tablet  Commonly known as:  AMARYL  Take 2 mg by mouth daily before breakfast.  HYDROmorphone 2 MG tablet  Commonly known as:  DILAUDID  Take 1-2 tablets (2-4 mg total) by mouth every 4  (four) hours as needed for severe pain.     methocarbamol 500 MG tablet  Commonly known as:  ROBAXIN  Take 1 tablet (500 mg total) by mouth every 6 (six) hours as needed for muscle spasms.     mirabegron ER 25 MG Tb24 tablet  Commonly known as:  MYRBETRIQ  Take 25 mg by mouth at bedtime.     NON FORMULARY  C-Pap.     pioglitazone 30 MG tablet  Commonly known as:  ACTOS  Take 30 mg by mouth at bedtime.     rivastigmine 3 MG capsule  Commonly known as:  EXELON  Take 3 mg by mouth 2 (two) times daily.     traMADol 50 MG tablet  Commonly known as:  ULTRAM  Take 1-2 tablets (50-100 mg total) by mouth every 6 (six) hours as needed for moderate pain.           Follow-up Information    Follow up with Gearlean Alf, MD. Schedule an appointment as soon as possible for a visit on 08/29/2015.   Specialty:  Orthopedic Surgery   Contact information:   [address] [place] [address] [phone]       Follow up with [facility].   Why:  Cedric Fishman has been requested for your physical therapist   Contact information:   [address] [place] [address] [phone]       Signed: Arlee Muslim, PA-C Orthopaedic Surgery 09/12/2015, 9:44 AM

## 2015-09-19 ENCOUNTER — Ambulatory Visit: Payer: Medicare Other | Admitting: Pulmonary Disease

## 2015-10-29 ENCOUNTER — Encounter: Payer: Self-pay | Admitting: Adult Health

## 2016-01-17 ENCOUNTER — Ambulatory Visit (INDEPENDENT_AMBULATORY_CARE_PROVIDER_SITE_OTHER): Payer: Medicare Other | Admitting: Urology

## 2016-01-17 DIAGNOSIS — Z8546 Personal history of malignant neoplasm of prostate: Secondary | ICD-10-CM | POA: Diagnosis not present

## 2016-01-17 DIAGNOSIS — N359 Urethral stricture, unspecified: Secondary | ICD-10-CM | POA: Diagnosis not present

## 2016-01-17 DIAGNOSIS — R32 Unspecified urinary incontinence: Secondary | ICD-10-CM | POA: Diagnosis not present

## 2016-01-17 DIAGNOSIS — N5201 Erectile dysfunction due to arterial insufficiency: Secondary | ICD-10-CM | POA: Diagnosis not present

## 2016-03-05 ENCOUNTER — Other Ambulatory Visit: Payer: Self-pay | Admitting: Internal Medicine

## 2016-03-05 DIAGNOSIS — M5416 Radiculopathy, lumbar region: Secondary | ICD-10-CM

## 2016-03-07 ENCOUNTER — Ambulatory Visit
Admission: RE | Admit: 2016-03-07 | Discharge: 2016-03-07 | Disposition: A | Discharge status: Home / Self Care | Payer: Medicare Other | Source: Ambulatory Visit | Attending: Internal Medicine | Admitting: Internal Medicine

## 2016-03-07 DIAGNOSIS — M5416 Radiculopathy, lumbar region: Secondary | ICD-10-CM

## 2016-03-10 ENCOUNTER — Other Ambulatory Visit (INDEPENDENT_AMBULATORY_CARE_PROVIDER_SITE_OTHER): Payer: Self-pay | Admitting: Internal Medicine

## 2016-03-10 ENCOUNTER — Encounter (INDEPENDENT_AMBULATORY_CARE_PROVIDER_SITE_OTHER): Payer: Self-pay | Admitting: *Deleted

## 2016-03-10 ENCOUNTER — Telehealth (INDEPENDENT_AMBULATORY_CARE_PROVIDER_SITE_OTHER): Payer: Self-pay | Admitting: *Deleted

## 2016-03-10 DIAGNOSIS — K625 Hemorrhage of anus and rectum: Secondary | ICD-10-CM

## 2016-03-10 NOTE — Telephone Encounter (Addendum)
Referring MD/PCP: fagan   Procedure: tcs  Reason/Indication:  Rectal bleeding  Has patient had this procedure before?  Yes, 2004 -- epic  If so, when, by whom and where?    Is there a family history of colon cancer?  no  Who?  What age when diagnosed?    Is patient diabetic?   yes      Does patient have prosthetic heart valve or mechanical valve?  no  Do you have a pacemaker?  no  Has patient ever had endocarditis? no  Has patient had joint replacement within last 12 months?  Yes -- hip replcement 2016  Does patient tend to be constipated or take laxatives? sometime  Does patient have a history of alcohol/drug use?  no  Is patient on Coumadin, Plavix and/or Aspirin? yes  Medications: gabapentin 600 mg 6 tabs daily, fenofibrate 160 mg daily, rivastigmine 3 mg bid, glimepiride 2 mg daily, pioglitazone  30 mg daily, myrbetriq 25 mg daily, asa 81 mg daily, vit d 50,000 1 weekly, ferrous sulfate 325 mg daily,   Allergies: nkda  Medication Adjustment: asa 2 days  Procedure date & time: 03/13/16 at 1255

## 2016-03-11 NOTE — Telephone Encounter (Signed)
agree

## 2016-03-12 ENCOUNTER — Other Ambulatory Visit: Payer: Medicare Other

## 2016-03-13 ENCOUNTER — Encounter (HOSPITAL_COMMUNITY)
Admission: RE | Disposition: A | Discharge status: Home / Self Care | Payer: Self-pay | Source: Ambulatory Visit | Attending: Internal Medicine

## 2016-03-13 ENCOUNTER — Encounter (HOSPITAL_COMMUNITY): Payer: Self-pay | Admitting: *Deleted

## 2016-03-13 ENCOUNTER — Ambulatory Visit (HOSPITAL_COMMUNITY)
Admission: RE | Admit: 2016-03-13 | Discharge: 2016-03-13 | Disposition: A | Discharge status: Home / Self Care | Payer: Medicare Other | Source: Ambulatory Visit | Attending: Internal Medicine | Admitting: Internal Medicine

## 2016-03-13 DIAGNOSIS — Z96643 Presence of artificial hip joint, bilateral: Secondary | ICD-10-CM | POA: Diagnosis not present

## 2016-03-13 DIAGNOSIS — E119 Type 2 diabetes mellitus without complications: Secondary | ICD-10-CM | POA: Diagnosis not present

## 2016-03-13 DIAGNOSIS — K625 Hemorrhage of anus and rectum: Secondary | ICD-10-CM | POA: Diagnosis not present

## 2016-03-13 DIAGNOSIS — K5730 Diverticulosis of large intestine without perforation or abscess without bleeding: Secondary | ICD-10-CM | POA: Diagnosis not present

## 2016-03-13 DIAGNOSIS — Z8546 Personal history of malignant neoplasm of prostate: Secondary | ICD-10-CM | POA: Insufficient documentation

## 2016-03-13 DIAGNOSIS — G4730 Sleep apnea, unspecified: Secondary | ICD-10-CM | POA: Diagnosis not present

## 2016-03-13 DIAGNOSIS — Z7982 Long term (current) use of aspirin: Secondary | ICD-10-CM | POA: Diagnosis not present

## 2016-03-13 DIAGNOSIS — Z87891 Personal history of nicotine dependence: Secondary | ICD-10-CM | POA: Insufficient documentation

## 2016-03-13 DIAGNOSIS — I10 Essential (primary) hypertension: Secondary | ICD-10-CM | POA: Diagnosis not present

## 2016-03-13 DIAGNOSIS — K644 Residual hemorrhoidal skin tags: Secondary | ICD-10-CM | POA: Insufficient documentation

## 2016-03-13 DIAGNOSIS — K648 Other hemorrhoids: Secondary | ICD-10-CM | POA: Diagnosis not present

## 2016-03-13 DIAGNOSIS — M1991 Primary osteoarthritis, unspecified site: Secondary | ICD-10-CM | POA: Insufficient documentation

## 2016-03-13 HISTORY — PX: COLONOSCOPY: SHX5424

## 2016-03-13 LAB — GLUCOSE, CAPILLARY: Glucose-Capillary: 143 mg/dL — ABNORMAL HIGH (ref 65–99)

## 2016-03-13 SURGERY — COLONOSCOPY
Anesthesia: Moderate Sedation

## 2016-03-13 MED ORDER — MEPERIDINE HCL 50 MG/ML IJ SOLN
INTRAMUSCULAR | Status: DC
Start: 2016-03-13 — End: 2016-03-13
  Filled 2016-03-13: qty 1

## 2016-03-13 MED ORDER — MEPERIDINE HCL 50 MG/ML IJ SOLN
INTRAMUSCULAR | Status: DC | PRN
  Administered 2016-03-13 (×2): 25 mg

## 2016-03-13 MED ORDER — SODIUM CHLORIDE 0.9 % IV SOLN
INTRAVENOUS | Status: DC
  Administered 2016-03-13: 1000 mL via INTRAVENOUS

## 2016-03-13 MED ORDER — MIDAZOLAM HCL 5 MG/5ML IJ SOLN
INTRAMUSCULAR | Status: AC
  Filled 2016-03-13: qty 10

## 2016-03-13 MED ORDER — STERILE WATER FOR IRRIGATION IR SOLN
Status: DC | PRN
Start: 2016-03-13 — End: 2016-03-13
  Administered 2016-03-13: 13:00:00

## 2016-03-13 MED ORDER — MIDAZOLAM HCL 5 MG/5ML IJ SOLN
INTRAMUSCULAR | Status: DC | PRN
  Administered 2016-03-13 (×2): 1 mg via INTRAVENOUS
  Administered 2016-03-13: 2 mg via INTRAVENOUS

## 2016-03-13 MED ORDER — CEFAZOLIN SODIUM-DEXTROSE 2-3 GM-% IV SOLR
2.0000 g | Freq: Once | INTRAVENOUS | Status: AC
  Administered 2016-03-13: 2 g via INTRAVENOUS
  Filled 2016-03-13: qty 50

## 2016-03-13 MED ORDER — DEXTROSE 5 % IV SOLN: 2.0000 g | Freq: Once | INTRAVENOUS | Status: DC

## 2016-03-13 NOTE — Op Note (Signed)
[facility] Patient Name: Manuel [name] Procedure Date: 03/13/2016 12:58 PM MRN: [id number] Date of Birth: May 03, 1935 Attending MD: Hildred Laser , MD CSN: [id number] Age: 81 Admit Type: Outpatient Procedure:                Colonoscopy Indications:              Rectal bleeding Providers:                Hildred Laser, MD, Renda Rolls, RN, Randa Spike,                            Technician Referring MD:             Asencion Noble, MD (Referring MD) Medicines:                Meperidine 50 mg IV, Midazolam 4 mg IV Complications:            No immediate complications. Estimated Blood Loss:     Estimated blood loss: none. Procedure:                Pre-Anesthesia Assessment:                           - Prior to the procedure, a History and Physical                            was performed, and patient medications and                            allergies were reviewed. The patient's tolerance of                            previous anesthesia was also reviewed. The risks                            and benefits of the procedure and the sedation                            options and risks were discussed with the patient.                            All questions were answered, and informed consent                            was obtained. Prior Anticoagulants: The patient                            last took aspirin 4 days prior to the procedure.                            ASA Grade Assessment: II - A patient with mild                            systemic disease. After reviewing the risks and  benefits, the patient was deemed in satisfactory                            condition to undergo the procedure.                           After obtaining informed consent, the colonoscope                            was passed under direct vision. Throughout the                            procedure, the patient's blood pressure, pulse, and                            oxygen  saturations were monitored continuously. The                            [id number]) was introduced through the                            anus and advanced to the the cecum, identified by                            appendiceal orifice and ileocecal valve. The                            colonoscopy was performed without difficulty. The                            patient tolerated the procedure well. The quality                            of the bowel preparation was adequate. The                            ileocecal valve, appendiceal orifice, and rectum                            were photographed. Scope In: 1:20:36 PM Scope Out: 1:46:32 PM Scope Withdrawal Time: 0 hours 17 minutes 48 seconds  Total Procedure Duration: 0 hours 25 minutes 56 seconds  Findings:      Multiple medium-mouthed diverticula were found in the sigmoid colon.      Non-bleeding external hemorrhoids were found during retroflexion.       Estimated blood loss: none. Impression:               - Diverticulosis in the sigmoid colon.                           - Non-bleeding external hemorrhoids.                           - No specimens collected.                           -  Comment:                           suspect colonic diverticular bleed.no further                            workup unless bleeding recurrent. Moderate Sedation:      Moderate (conscious) sedation was administered by the endoscopy nurse       and supervised by the endoscopist. The following parameters were       monitored: oxygen saturation, heart rate, blood pressure, CO2       capnography and response to care. Total physician intraservice time was       34 minutes. Recommendation:           - Patient has a contact number available for                            emergencies. The signs and symptoms of potential                            delayed complications were discussed with the                            patient. Return to normal  activities tomorrow.                            Written discharge instructions were provided to the                            patient.                           - High fiber diet today.                           - Continue present medications.                           - Repeat colonoscopy is not recommended for                            screening purposes.                           - Return to primary care physician in 4 weeks.                           - Resume aspirin at prior dose in 1 week. Procedure Code(s):        --- Professional ---                           [phone], Colonoscopy, flexible; diagnostic, including                            collection of specimen(s) by brushing or washing,  when performed (separate procedure)                           99152, Moderate sedation services provided by the                            same physician or other qualified health care                            professional performing the diagnostic or                            therapeutic service that the sedation supports,                            requiring the presence of an independent trained                            observer to assist in the monitoring of the                            patient's level of consciousness and physiological                            status; initial 15 minutes of intraservice time,                            patient age 42 years or older                           [phone], Moderate sedation services; each additional                            15 minutes intraservice time Diagnosis Code(s):        --- Professional ---                           K64.4, Residual hemorrhoidal skin tags                           K62.5, Hemorrhage of anus and rectum                           K57.30, Diverticulosis of large intestine without                            perforation or abscess without bleeding CPT copyright 2016 [employer].  All rights reserved. The codes documented in this report are preliminary and upon coder review may  be revised to meet current compliance requirements. Hildred Laser, MD Hildred Laser, MD 03/13/2016 2:03:33 PM This report has been signed electronically. Number of Addenda: 0

## 2016-03-13 NOTE — H&P (Signed)
Manuel [name] is an 81 y.o. male.   Chief Complaint: Patient is here for colonoscopy. HPI: 81 year old Caucasian male with history of colonic adenoma who presented to Dr. Ria Comment office earlier this week with 2 day history of painless large-volume hematochezia. He was hemodynamically felt to be stable. He was advised to stop aspirin. He denies nausea vomiting abdominal pain diarrhea constipation. He had small adenoma removed by me in 2004. He possibly had another colonoscopy was in 5 years ago. Family history is negative for CRC.  Past Medical History  Diagnosis Date  . Hypertension   . Back pain   . Cancer (HCC)     PROSTATE -- RADIATION SEED IMPLANT  . Diabetes mellitus     DX SINCE 1998  . Heart murmur   . Sleep apnea   . Arthritis     Past Surgical History  Procedure Laterality Date  . Back surgery    . Prostate surgery      with seed implants  . Cholecystectomy    . Vasectomy    . Eye surgery      CATARACT BOTH EYES 2014  . Transurethral resection of prostate  2012  . Total hip arthroplasty Right 04/20/2014    Procedure: RIGHT TOTAL HIP ARTHROPLASTY ANTERIOR APPROACH;  Surgeon: Gearlean Alf, MD;  Location: [place];  Service: Orthopedics;  Laterality: Right;  . Cystoscopy with urethral dilatation N/A 04/20/2014    Procedure: CYSTOSCOPY WITH URETHRAL DILATATION AND FOLEY PLACEMENT;  Surgeon: Gearlean Alf, MD;  Location: [place];  Service: Orthopedics;  Laterality: N/A;  . Vasectomy    . Prostate radiation seed implantation    . Total hip arthroplasty Left 08/14/2015    Procedure: LEFT TOTAL HIP ARTHROPLASTY ANTERIOR APPROACH;  Surgeon: Gaynelle Arabian, MD;  Location: [facility];  Service: Orthopedics;  Laterality: Left;    Family History  Problem Relation Age of Onset  . Asthma Mother   . Diabetes Mother   . Heart attack Father   . COPD Brother    Social History:  reports that he quit smoking about 37 years ago. His smoking use included Cigarettes. He has a 2.9 pack-year  smoking history. He has never used smokeless tobacco. He reports that he does not drink alcohol or use illicit drugs.  Allergies:  Allergies  Allergen Reactions  . Hydrocodone Nausea And Vomiting    Medications Prior to Admission  Medication Sig Dispense Refill  . aspirin EC 81 MG tablet Take 81 mg by mouth daily.    . fenofibrate 160 MG tablet Take 1 tablet by mouth daily.    . ferrous sulfate 325 (65 FE) MG tablet Take 325 mg by mouth daily with breakfast.    . gabapentin (NEURONTIN) 600 MG tablet Take 1,200-2,400 mg by mouth 2 (two) times daily. 2 tablets in the morning and 4 at bedtime.    Marland Kitchen glimepiride (AMARYL) 2 MG tablet Take 2 mg by mouth daily before breakfast.    . NON FORMULARY C-Pap.    . peg 3350 powder (MOVIPREP) 100 g SOLR Take 1 kit by mouth.    . pioglitazone (ACTOS) 30 MG tablet Take 30 mg by mouth at bedtime.     . rivastigmine (EXELON) 3 MG capsule Take 3 mg by mouth 2 (two) times daily.     . Vitamin D, Ergocalciferol, (DRISDOL) 50000 units CAPS capsule Take 50,000 Units by mouth every 7 (seven) days.    Marland Kitchen apixaban (ELIQUIS) 2.5 MG TABS tablet Take 1 tablet (  2.5 mg total) by mouth every 12 (twelve) hours. (Patient not taking: Reported on 03/12/2016) 40 tablet 0  . HYDROmorphone (DILAUDID) 2 MG tablet Take 1-2 tablets (2-4 mg total) by mouth every 4 (four) hours as needed for severe pain. (Patient not taking: Reported on 03/12/2016) 60 tablet 0  . methocarbamol (ROBAXIN) 500 MG tablet Take 1 tablet (500 mg total) by mouth every 6 (six) hours as needed for muscle spasms. (Patient not taking: Reported on 03/12/2016) 40 tablet 1  . traMADol (ULTRAM) 50 MG tablet Take 1-2 tablets (50-100 mg total) by mouth every 6 (six) hours as needed for moderate pain. (Patient not taking: Reported on 03/12/2016) 60 tablet 0    No results found for this or any previous visit (from the past 48 hour(s)). No results found.  ROS  Blood pressure 174/63, pulse 56, temperature 98.7 F (37.1 C),  temperature source Oral, resp. rate 19, height _0  (1.854 m), weight 258 lb (117.028 kg), SpO2 98 %. Physical Exam  Constitutional: He appears well-developed and well-nourished.  HENT:  Mouth/Throat: Oropharynx is clear and moist.  Eyes: Conjunctivae are normal. No scleral icterus.  Neck: No thyromegaly present.  Cardiovascular: Normal rate and regular rhythm.   Murmur (faint systolic ejection murmur best heard at left lower sternal border.) heard. Respiratory: Effort normal and breath sounds normal.  GI:  Abdomen is full but soft and nontender without organomegaly or masses.  Musculoskeletal: He exhibits no edema.  Lymphadenopathy:    He has no cervical adenopathy.  Neurological: He is alert.  Skin: Skin is warm and dry.     Assessment/Plan Rectal bleeding. Diagnostic colonoscopy.  Rogene Houston, MD 03/13/2016, 1:06 PM

## 2016-03-13 NOTE — Discharge Instructions (Signed)
Resume aspirin on 03/19/2016. Resume other medications and high fiber diet. No driving for 24 hours. Call if he have rectal bleeding. Follow-up with Dr. Willey Blade in one month. Colonoscopy, Care After Refer to this sheet in the next few weeks. These instructions provide you with information on caring for yourself after your procedure. Your health care provider may also give you more specific instructions. Your treatment has been planned according to current medical practices, but problems sometimes occur. Call your health care provider if you have any problems or questions after your procedure. WHAT TO EXPECT AFTER THE PROCEDURE  After your procedure, it is typical to have the following:  A small amount of blood in your stool.  Moderate amounts of gas and mild abdominal cramping or bloating. HOME CARE INSTRUCTIONS  Do not drive, operate machinery, or sign important documents for 24 hours.  You may shower and resume your regular physical activities, but move at a slower pace for the first 24 hours.  Take frequent rest periods for the first 24 hours.  Walk around or put a warm pack on your abdomen to help reduce abdominal cramping and bloating.  Drink enough fluids to keep your urine clear or pale yellow.  You may resume your normal diet as instructed by your health care provider. Avoid heavy or fried foods that are hard to digest.  Avoid drinking alcohol for 24 hours or as instructed by your health care provider.  Only take over-the-counter or prescription medicines as directed by your health care provider.  If a tissue sample (biopsy) was taken during your procedure:  Do not take aspirin or blood thinners for 7 days, or as instructed by your health care provider.  Do not drink alcohol for 7 days, or as instructed by your health care provider.  Eat soft foods for the first 24 hours. SEEK MEDICAL CARE IF: You have persistent spotting of blood in your stool 2-3 days after the  procedure. SEEK IMMEDIATE MEDICAL CARE IF:  You have more than a small spotting of blood in your stool.  You pass large blood clots in your stool.  Your abdomen is swollen (distended).  You have nausea or vomiting.  You have a fever.  You have increasing abdominal pain that is not relieved with medicine.   This information is not intended to replace advice given to you by your health care provider. Make sure you discuss any questions you have with your health care provider.   Document Released: 07/28/2004 Document Revised: 10/04/2013 Document Reviewed: 08/21/2013 Elsevier Interactive Patient Education 2016 Elsevier Inc. High-Fiber Diet Fiber, also called dietary fiber, is a type of carbohydrate found in fruits, vegetables, whole grains, and beans. A high-fiber diet can have many health benefits. Your health care provider may recommend a high-fiber diet to help:  Prevent constipation. Fiber can make your bowel movements more regular.  Lower your cholesterol.  Relieve hemorrhoids, uncomplicated diverticulosis, or irritable bowel syndrome.  Prevent overeating as part of a weight-loss plan.  Prevent heart disease, type 2 diabetes, and certain cancers. WHAT IS MY PLAN? The recommended daily intake of fiber includes:  38 grams for men under age 47.  70 grams for men over age 4.  50 grams for women under age 57.  87 grams for women over age 49. You can get the recommended daily intake of dietary fiber by eating a variety of fruits, vegetables, grains, and beans. Your health care provider may also recommend a fiber supplement if it is not possible  to get enough fiber through your diet. WHAT DO I NEED TO KNOW ABOUT A HIGH-FIBER DIET?  Fiber supplements have not been widely studied for their effectiveness, so it is better to get fiber through food sources.  Always check the fiber content on thenutrition facts label of any prepackaged food. Look for foods that contain at least 5  grams of fiber per serving.  Ask your dietitian if you have questions about specific foods that are related to your condition, especially if those foods are not listed in the following section.  Increase your daily fiber consumption gradually. Increasing your intake of dietary fiber too quickly may cause bloating, cramping, or gas.  Drink plenty of water. Water helps you to digest fiber. WHAT FOODS CAN I EAT? Grains Whole-grain breads. Multigrain cereal. Oats and oatmeal. Brown rice. Barley. Bulgur wheat. Clifford. Bran muffins. Popcorn. Rye wafer crackers. Vegetables Sweet potatoes. Spinach. Kale. Artichokes. Cabbage. Broccoli. Green peas. Carrots. Squash. Fruits Berries. Pears. Apples. Oranges. Avocados. Prunes and raisins. Dried figs. Meats and Other Protein Sources Navy, kidney, pinto, and soy beans. Split peas. Lentils. Nuts and seeds. Dairy Fiber-fortified yogurt. Beverages Fiber-fortified soy milk. Fiber-fortified orange juice. Other Fiber bars. The items listed above may not be a complete list of recommended foods or beverages. Contact your dietitian for more options. WHAT FOODS ARE NOT RECOMMENDED? Grains White bread. Pasta made with refined flour. White rice. Vegetables Fried potatoes. Canned vegetables. Well-cooked vegetables.  Fruits Fruit juice. Cooked, strained fruit. Meats and Other Protein Sources Fatty cuts of meat. Fried Sales executive or fried fish. Dairy Milk. Yogurt. Cream cheese. Sour cream. Beverages Soft drinks. Other Cakes and pastries. Butter and oils. The items listed above may not be a complete list of foods and beverages to avoid. Contact your dietitian for more information. WHAT ARE SOME TIPS FOR INCLUDING HIGH-FIBER FOODS IN MY DIET?  Eat a wide variety of high-fiber foods.  Make sure that half of all grains consumed each day are whole grains.  Replace breads and cereals made from refined flour or white flour with whole-grain breads and  cereals.  Replace white rice with brown rice, bulgur wheat, or millet.  Start the day with a breakfast that is high in fiber, such as a cereal that contains at least 5 grams of fiber per serving.  Use beans in place of meat in soups, salads, or pasta.  Eat high-fiber snacks, such as berries, raw vegetables, nuts, or popcorn.   This information is not intended to replace advice given to you by your health care provider. Make sure you discuss any questions you have with your health care provider.   Document Released: 12/14/2005 Document Revised: 01/04/2015 Document Reviewed: 05/29/2014 Elsevier Interactive Patient Education [employer].

## 2016-03-17 ENCOUNTER — Other Ambulatory Visit: Payer: Self-pay | Admitting: Neurosurgery

## 2016-03-17 ENCOUNTER — Ambulatory Visit
Admission: RE | Admit: 2016-03-17 | Discharge: 2016-03-17 | Disposition: A | Discharge status: Home / Self Care | Payer: Medicare Other | Source: Ambulatory Visit | Attending: Neurosurgery | Admitting: Neurosurgery

## 2016-03-17 DIAGNOSIS — G8929 Other chronic pain: Secondary | ICD-10-CM

## 2016-03-17 DIAGNOSIS — M545 Low back pain, unspecified: Secondary | ICD-10-CM

## 2016-03-17 MED ORDER — METHYLPREDNISOLONE ACETATE 40 MG/ML INJ SUSP (RADIOLOG
120.0000 mg | Freq: Once | INTRAMUSCULAR | Status: AC
  Administered 2016-03-17: 120 mg via EPIDURAL

## 2016-03-17 MED ORDER — IOHEXOL 180 MG/ML  SOLN
1.0000 mL | Freq: Once | INTRAMUSCULAR | Status: AC | PRN
  Administered 2016-03-17: 1 mL via EPIDURAL

## 2016-03-17 NOTE — Discharge Instructions (Signed)

## 2016-03-18 ENCOUNTER — Ambulatory Visit (HOSPITAL_COMMUNITY)
Admission: RE | Admit: 2016-03-18 | Discharge: 2016-03-18 | Disposition: A | Discharge status: Home / Self Care | Payer: Medicare Other | Source: Ambulatory Visit | Attending: Internal Medicine | Admitting: Internal Medicine

## 2016-03-18 ENCOUNTER — Other Ambulatory Visit (HOSPITAL_COMMUNITY): Payer: Self-pay | Admitting: Internal Medicine

## 2016-03-18 DIAGNOSIS — R0781 Pleurodynia: Secondary | ICD-10-CM | POA: Diagnosis present

## 2016-03-18 DIAGNOSIS — I517 Cardiomegaly: Secondary | ICD-10-CM | POA: Diagnosis not present

## 2016-03-18 DIAGNOSIS — W19XXXA Unspecified fall, initial encounter: Secondary | ICD-10-CM

## 2016-03-19 ENCOUNTER — Emergency Department (HOSPITAL_COMMUNITY)
Admission: EM | Admit: 2016-03-19 | Discharge: 2016-03-19 | Disposition: A | Discharge status: Home / Self Care | Payer: Medicare Other | Attending: Emergency Medicine | Admitting: Emergency Medicine

## 2016-03-19 ENCOUNTER — Encounter (HOSPITAL_COMMUNITY): Payer: Self-pay

## 2016-03-19 ENCOUNTER — Emergency Department (HOSPITAL_COMMUNITY): Payer: Medicare Other

## 2016-03-19 DIAGNOSIS — G8929 Other chronic pain: Secondary | ICD-10-CM | POA: Insufficient documentation

## 2016-03-19 DIAGNOSIS — Z7984 Long term (current) use of oral hypoglycemic drugs: Secondary | ICD-10-CM | POA: Insufficient documentation

## 2016-03-19 DIAGNOSIS — Z7982 Long term (current) use of aspirin: Secondary | ICD-10-CM | POA: Insufficient documentation

## 2016-03-19 DIAGNOSIS — Z79899 Other long term (current) drug therapy: Secondary | ICD-10-CM | POA: Insufficient documentation

## 2016-03-19 DIAGNOSIS — M5441 Lumbago with sciatica, right side: Secondary | ICD-10-CM | POA: Insufficient documentation

## 2016-03-19 DIAGNOSIS — M1990 Unspecified osteoarthritis, unspecified site: Secondary | ICD-10-CM | POA: Insufficient documentation

## 2016-03-19 DIAGNOSIS — I10 Essential (primary) hypertension: Secondary | ICD-10-CM | POA: Insufficient documentation

## 2016-03-19 DIAGNOSIS — E119 Type 2 diabetes mellitus without complications: Secondary | ICD-10-CM | POA: Insufficient documentation

## 2016-03-19 DIAGNOSIS — Z87891 Personal history of nicotine dependence: Secondary | ICD-10-CM | POA: Insufficient documentation

## 2016-03-19 DIAGNOSIS — M545 Low back pain: Secondary | ICD-10-CM | POA: Diagnosis present

## 2016-03-19 MED ORDER — ONDANSETRON 4 MG PO TBDP
4.0000 mg | ORAL_TABLET | Freq: Three times a day (TID) | ORAL | Status: DC | PRN
Start: 2016-03-19 — End: 2016-08-07

## 2016-03-19 MED ORDER — HYDROMORPHONE HCL 1 MG/ML IJ SOLN
1.0000 mg | Freq: Once | INTRAMUSCULAR | Status: AC
  Administered 2016-03-19: 1 mg via INTRAMUSCULAR
  Filled 2016-03-19: qty 1

## 2016-03-19 MED ORDER — ONDANSETRON 4 MG PO TBDP
4.0000 mg | ORAL_TABLET | Freq: Once | ORAL | Status: AC
  Administered 2016-03-19: 4 mg via ORAL
  Filled 2016-03-19: qty 1

## 2016-03-19 MED ORDER — HYDROMORPHONE HCL 1 MG/ML IJ SOLN
0.5000 mg | Freq: Once | INTRAMUSCULAR | Status: AC
  Administered 2016-03-19: 0.5 mg via INTRAMUSCULAR
  Filled 2016-03-19: qty 1

## 2016-03-19 NOTE — ED Provider Notes (Signed)
TIME SEEN: 2:55 AM  CHIEF COMPLAINT: Lower back pain  HPI: Pt is a 81 y.o. male with history of hypertension, diabetes, back pain who is status post lumbar surgery who presents to the emergency department with complaints of back pain that has been present for the past 2 months. Patient had MRI of his lumbar spine performed on March 11 seen below. Had an epidurography on 3/21 by Dr. Jola Baptist - Technically successful RIGHT L3 selective nerve root block with RIGHT L3-4 transforaminal epidural steroid injection.  He states that after his injection his pain that was in his right lower back completely resolved and he was no longer having pain in his leg. He states that when this medication more off the next day around noon he started having pain in his right lower back and down his right leg that feels exactly like the pain that he had previously. He states that he did have a fall when going to this appointment yesterday. States that his right leg chronically gives out on him and he fell to the ground striking his head and hitting his lower back. Also reports hitting his ribs and was seen in the emergency department for x-rays ordered by his PCP. Denies loss of consciousness.  Denies being on antiplatelet agent or anticoagulation other than aspirin. Patient denies any numbness, tingling or focal weakness. No bowel or bladder incontinence. No fever. He has Dilaudid, oxycodone and Vicodin at home which she reports is not helping his symptoms. States he is taking 2-4 mg of Dilaudid every 6 hours.   MRI L spine 03/07/16 IMPRESSION: 1. No acute findings or clear explanation for recent onset right radicular symptoms. 2. Stable postsurgical changes at L2-3 with chronic mild narrowing of the right lateral recess and right foramen secondary to osteophytes. 3. Mildly progressive narrowing of the left lateral recess at L3-4 with stable chronic left-greater-than-right foraminal narrowing. 4. Stable chronic spondylosis at  L4-5, contributing to chronic left-greater-than-right foraminal narrowing. 5. Stable left-greater-than-right foraminal narrowing at L5-S1 secondary to facet disease.  ROS: See HPI Constitutional: no fever  Eyes: no drainage  ENT: no runny nose   Cardiovascular:  no chest pain  Resp: no SOB  GI: no vomiting GU: no dysuria Integumentary: no rash  Allergy: no hives  Musculoskeletal: no leg swelling  Neurological: no slurred speech ROS otherwise negative  PAST MEDICAL HISTORY/PAST SURGICAL HISTORY:  Past Medical History  Diagnosis Date  . Hypertension   . Back pain   . Cancer (HCC)     PROSTATE -- RADIATION SEED IMPLANT  . Diabetes mellitus     DX SINCE 1998  . Heart murmur   . Sleep apnea   . Arthritis     MEDICATIONS:  Prior to Admission medications   Medication Sig Start Date End Date Taking? Authorizing Provider  aspirin EC 81 MG tablet Take 1 tablet (81 mg total) by mouth daily. 03/19/16  Yes Rogene Houston, MD  fenofibrate 160 MG tablet Take 1 tablet by mouth daily. 01/14/16  Yes Historical Provider, MD  ferrous sulfate 325 (65 FE) MG tablet Take 325 mg by mouth daily with breakfast.   Yes Historical Provider, MD  gabapentin (NEURONTIN) 600 MG tablet Take 1,200-2,400 mg by mouth 2 (two) times daily. 2 tablets in the morning and 4 at bedtime.   Yes Historical Provider, MD  glimepiride (AMARYL) 2 MG tablet Take 2 mg by mouth daily before breakfast.   Yes Historical Provider, MD  NON FORMULARY C-Pap.   Yes  Historical Provider, MD  pioglitazone (ACTOS) 30 MG tablet Take 30 mg by mouth at bedtime.    Yes Historical Provider, MD  rivastigmine (EXELON) 3 MG capsule Take 3 mg by mouth 2 (two) times daily.    Yes Historical Provider, MD  Vitamin D, Ergocalciferol, (DRISDOL) 50000 units CAPS capsule Take 50,000 Units by mouth every 7 (seven) days.   Yes Historical Provider, MD  peg 3350 powder (MOVIPREP) 100 g SOLR Take 1 kit by mouth.    Historical Provider, MD    ALLERGIES:   Allergies  Allergen Reactions  . Hydrocodone Nausea And Vomiting    SOCIAL HISTORY:  Social History  Substance Use Topics  . Smoking status: Former Smoker -- 0.10 packs/day for 29 years    Types: Cigarettes    Quit date: 12/28/1978  . Smokeless tobacco: Never Used  . Alcohol Use: No    FAMILY HISTORY: Family History  Problem Relation Age of Onset  . Asthma Mother   . Diabetes Mother   . Heart attack Father   . COPD Brother     EXAM: BP 185/65 mmHg  Pulse 66  Temp(Src) 98.2 F (36.8 C) (Oral)  Resp 19  Ht '6\' 1"'$  (1.854 m)  Wt 255 lb (115.667 kg)  BMI 33.65 kg/m2  SpO2 97% CONSTITUTIONAL: Alert and oriented and responds appropriately to questions. Elderly, appears uncomfortable, afebrile HEAD: Normocephalic EYES: Conjunctivae clear, PERRL ENT: normal nose; no rhinorrhea; moist mucous membranes NECK: Supple, no meningismus, no LAD  CARD: RRR; S1 and S2 appreciated; no murmurs, no clicks, no rubs, no gallops RESP: Normal chest excursion without splinting or tachypnea; breath sounds clear and equal bilaterally; no wheezes, no rhonchi, no rales, no hypoxia or respiratory distress, speaking full sentences ABD/GI: Normal bowel sounds; non-distended; soft, non-tender, no rebound, no guarding, no peritoneal signs BACK:  The back appears normal, minimally tender to palpation throughout the lower lumbar spine without step-off or deformity, no tenderness over the injection site over the right lumbar paraspinal musculature. No erythema or warmth associated over this site. No fluctuance or induration., there is no CVA tenderness EXT: Normal ROM in all joints; non-tender to palpation; no edema; normal capillary refill; no cyanosis, no calf tenderness or swelling    SKIN: Normal color for age and race; warm; no rash NEURO: Moves all extremities equally, sensation to light touch intact diffusely, cranial nerves II through XII intact; no saddle anesthesia, strength 5/5 in all 4  extremities PSYCH: The patient's mood and manner are appropriate. Grooming and personal hygiene are appropriate.  MEDICAL DECISION MAKING: Patient here with complaints of chronic back pain. States this is unchanged for the past 2 months. Did have an epidural injection less than 24 hours ago which helped with his pain but states this is the medication were office pain came back and he is now unable to control with his oral pain medication. I doubt that he has developed an epidural abscess or hematoma in this time. He has no new neurologic deficits. Doubt osteomyelitis, discitis, transverse myelitis.  Doubt cauda equina. Do not feel he needs an emergent MRI of his back. Will obtain an x-ray given his fall to evaluate for fracture. Will also obtain a CT of his head. We'll give IM Dilaudid and reassess.  ED PROGRESS: Patient reports he is feeling much better. He has been able to ambulate without difficulty and minimal assistance. States he uses a walker at home. CT of his head and x-ray of his lumbar spine show no  acute injury. I do feel he is safe to be discharged home with close outpatient follow-up. Have advised him to continue his Dilaudid as prescribed. Discussed with patient that I think that we needed to get in front of his pain so that we could control it better. I have discussed with him and his wife strict return precautions. They verbalize understanding and are comfortable with this plan.    Grandfather, DO 03/19/16 [phone]

## 2016-03-19 NOTE — ED Notes (Signed)
Ambulated patient around nurses station, gait was steady but slow. Patient walks with a  Walker at home. Made doctor aware of results.

## 2016-03-19 NOTE — Discharge Instructions (Signed)
You may take her Dilaudid 2-4 mg every 4-6 hours as needed for pain. Please follow-up with your PCP for further pain control. If you develop fever, worsening pain that is uncontrolled, numbness or weakness that is new for you, difficulty holding your bowel or bladder, unable to empty your bladder, please return to the hospital.   adicular Pain Radicular pain in either the arm or leg is usually from a bulging or herniated disk in the spine. A piece of the herniated disk may press against the nerves as the nerves exit the spine. This causes pain which is felt at the tips of the nerves down the arm or leg. Other causes of radicular pain may include:  Fractures.  Heart disease.  Cancer.  An abnormal and usually degenerative state of the nervous system or nerves (neuropathy). Diagnosis may require CT or MRI scanning to determine the primary cause.  Nerves that start at the neck (nerve roots) may cause radicular pain in the outer shoulder and arm. It can spread down to the thumb and fingers. The symptoms vary depending on which nerve root has been affected. In most cases radicular pain improves with conservative treatment. Neck problems may require physical therapy, a neck collar, or cervical traction. Treatment may take many weeks, and surgery may be considered if the symptoms do not improve.  Conservative treatment is also recommended for sciatica. Sciatica causes pain to radiate from the lower back or buttock area down the leg into the foot. Often there is a history of back problems. Most patients with sciatica are better after 2 to 4 weeks of rest and other supportive care. Short term bed rest can reduce the disk pressure considerably. Sitting, however, is not a good position since this increases the pressure on the disk. You should avoid bending, lifting, and all other activities which make the problem worse. Traction can be used in severe cases. Surgery is usually reserved for patients who do not  improve within the first months of treatment. Only take over-the-counter or prescription medicines for pain, discomfort, or fever as directed by your caregiver. Narcotics and muscle relaxants may help by relieving more severe pain and spasm and by providing mild sedation. Cold or massage can give significant relief. Spinal manipulation is not recommended. It can increase the degree of disc protrusion. Epidural steroid injections are often effective treatment for radicular pain. These injections deliver medicine to the spinal nerve in the space between the protective covering of the spinal cord and back bones (vertebrae). Your caregiver can give you more information about steroid injections. These injections are most effective when given within two weeks of the onset of pain.  You should see your caregiver for follow up care as recommended. A program for neck and back injury rehabilitation with stretching and strengthening exercises is an important part of management.  SEEK IMMEDIATE MEDICAL CARE IF:  You develop increased pain, weakness, or numbness in your arm or leg.  You develop difficulty with bladder or bowel control.  You develop abdominal pain.   This information is not intended to replace advice given to you by your health care provider. Make sure you discuss any questions you have with your health care provider.   Document Released: 01/21/2005 Document Revised: 01/04/2015 Document Reviewed: 07/10/2015 Elsevier Interactive Patient Education 2016 Elsevier Inc.  Chronic Back Pain  When back pain lasts longer than 3 months, it is called chronic back pain.People with chronic back pain often go through certain periods that are more intense (  flare-ups).  CAUSES Chronic back pain can be caused by wear and tear (degeneration) on different structures in your back. These structures include:  The bones of your spine (vertebrae) and the joints surrounding your spinal cord and nerve roots  (facets).  The strong, fibrous tissues that connect your vertebrae (ligaments). Degeneration of these structures may result in pressure on your nerves. This can lead to constant pain. HOME CARE INSTRUCTIONS  Avoid bending, heavy lifting, prolonged sitting, and activities which make the problem worse.  Take brief periods of rest throughout the day to reduce your pain. Lying down or standing usually is better than sitting while you are resting.  Take over-the-counter or prescription medicines only as directed by your caregiver. SEEK IMMEDIATE MEDICAL CARE IF:   You have weakness or numbness in one of your legs or feet.  You have trouble controlling your bladder or bowels.  You have nausea, vomiting, abdominal pain, shortness of breath, or fainting.   This information is not intended to replace advice given to you by your health care provider. Make sure you discuss any questions you have with your health care provider.   Document Released: 01/21/2005 Document Revised: 03/07/2012 Document Reviewed: 06/03/2015 Elsevier Interactive Patient Education [employer].

## 2016-03-19 NOTE — ED Notes (Signed)
Pt states he had an mri last Saturday, states he was told he had a ruptured disc, had an injection in his back which helped briefly.  Pt states the pain has returned and is severe again.

## 2016-04-03 ENCOUNTER — Encounter (HOSPITAL_COMMUNITY): Payer: Self-pay | Admitting: *Deleted

## 2016-04-03 ENCOUNTER — Emergency Department (HOSPITAL_COMMUNITY): Payer: Medicare Other

## 2016-04-03 ENCOUNTER — Emergency Department (HOSPITAL_COMMUNITY)
Admission: EM | Admit: 2016-04-03 | Discharge: 2016-04-03 | Disposition: A | Discharge status: Home / Self Care | Payer: Medicare Other | Attending: Emergency Medicine | Admitting: Emergency Medicine

## 2016-04-03 DIAGNOSIS — I10 Essential (primary) hypertension: Secondary | ICD-10-CM | POA: Insufficient documentation

## 2016-04-03 DIAGNOSIS — Y9389 Activity, other specified: Secondary | ICD-10-CM | POA: Insufficient documentation

## 2016-04-03 DIAGNOSIS — Z87891 Personal history of nicotine dependence: Secondary | ICD-10-CM | POA: Insufficient documentation

## 2016-04-03 DIAGNOSIS — E119 Type 2 diabetes mellitus without complications: Secondary | ICD-10-CM | POA: Diagnosis not present

## 2016-04-03 DIAGNOSIS — S40812A Abrasion of left upper arm, initial encounter: Secondary | ICD-10-CM | POA: Diagnosis not present

## 2016-04-03 DIAGNOSIS — Y92009 Unspecified place in unspecified non-institutional (private) residence as the place of occurrence of the external cause: Secondary | ICD-10-CM

## 2016-04-03 DIAGNOSIS — Y999 Unspecified external cause status: Secondary | ICD-10-CM | POA: Diagnosis not present

## 2016-04-03 DIAGNOSIS — S8991XA Unspecified injury of right lower leg, initial encounter: Secondary | ICD-10-CM | POA: Diagnosis present

## 2016-04-03 DIAGNOSIS — W19XXXA Unspecified fall, initial encounter: Secondary | ICD-10-CM

## 2016-04-03 DIAGNOSIS — Z7984 Long term (current) use of oral hypoglycemic drugs: Secondary | ICD-10-CM | POA: Diagnosis not present

## 2016-04-03 DIAGNOSIS — W1839XA Other fall on same level, initial encounter: Secondary | ICD-10-CM | POA: Insufficient documentation

## 2016-04-03 DIAGNOSIS — M1990 Unspecified osteoarthritis, unspecified site: Secondary | ICD-10-CM | POA: Diagnosis not present

## 2016-04-03 DIAGNOSIS — M25561 Pain in right knee: Secondary | ICD-10-CM

## 2016-04-03 DIAGNOSIS — Y929 Unspecified place or not applicable: Secondary | ICD-10-CM | POA: Diagnosis not present

## 2016-04-03 HISTORY — DX: Need for assistance with personal care: Z74.1

## 2016-04-03 HISTORY — DX: Other abnormalities of gait and mobility: R26.89

## 2016-04-03 NOTE — Discharge Instructions (Signed)
Take your usual prescriptions as previously directed.  Wash the abraded areas with soap and water at least twice a day, and cover with a clean/dry dressing.  Change the dressing whenever it becomes wet or soiled after washing the area with soap and water. Apply moist heat or ice to the area(s) of discomfort, for 15 minutes at a time, several times per day for the next few days.  Do not fall asleep on a heating or ice pack. Walk with your walker and wear the knee immobilizer for support until you are seen in follow up.  Call your regular medical doctor today to schedule a follow up appointment for a recheck within the next 2 days. Call the Orthopedic doctor today to schedule a follow up appointment within the next 3 days.  Return to the Emergency Department immediately if worsening.

## 2016-04-03 NOTE — ED Notes (Signed)
MD at bedside. 

## 2016-04-03 NOTE — ED Notes (Addendum)
Pt brought in from home by [facility] due to fall at home while ambulating without walker to the bathroom. Pt reports his right knee "just gave out", which happens quite often for pt. Pt was found to be lying in left lateral position when EMS arrived, pt is c/o right knee pain. Pt has back surgery last week at [facility] but has no c/o back pain. Abrasions to left arm.

## 2016-04-03 NOTE — ED Provider Notes (Signed)
CSN: [id number]     Arrival date & time 04/03/16  [id number] History   First MD Initiated Contact with Patient 04/03/16 [phone]     Chief Complaint  Patient presents with  . Fall      HPI Pt was seen at 91. Per EMS and pt report, c/o sudden onset and resolution of one episode of "fall" that occurred at home PTA. Pt states he was walking to and from the bathroom without his walker when his "right knee gave out the way it always does." Pt states he fell into the doorway. Pt c/o abrasions left upper arm and right knee pain. Denies hitting head, no LOC, no AMS, no neck or back pain, no focal motor weakness, no tingling/numbness in extremities, no prodromal symptoms before fall, no CP/SOB, no abd pain, no N/V/D.     Past Medical History  Diagnosis Date  . Hypertension   . Back pain   . Cancer (HCC)     PROSTATE -- RADIATION SEED IMPLANT  . Diabetes mellitus     DX SINCE 1998  . Heart murmur   . Sleep apnea   . Arthritis   . Poor balance   . Assistance needed for mobility     walker   Past Surgical History  Procedure Laterality Date  . Back surgery    . Prostate surgery      with seed implants  . Cholecystectomy    . Vasectomy    . Eye surgery      CATARACT BOTH EYES 2014  . Transurethral resection of prostate  2012  . Total hip arthroplasty Right 04/20/2014    Procedure: RIGHT TOTAL HIP ARTHROPLASTY ANTERIOR APPROACH;  Surgeon: Gearlean Alf, MD;  Location: [place];  Service: Orthopedics;  Laterality: Right;  . Cystoscopy with urethral dilatation N/A 04/20/2014    Procedure: CYSTOSCOPY WITH URETHRAL DILATATION AND FOLEY PLACEMENT;  Surgeon: Gearlean Alf, MD;  Location: [place];  Service: Orthopedics;  Laterality: N/A;  . Vasectomy    . Prostate radiation seed implantation    . Total hip arthroplasty Left 08/14/2015    Procedure: LEFT TOTAL HIP ARTHROPLASTY ANTERIOR APPROACH;  Surgeon: Gaynelle Arabian, MD;  [facility];  Service: Orthopedics;  Laterality: Left;  . Colonoscopy N/A  03/13/2016    Procedure: COLONOSCOPY;  Surgeon: Rogene Houston, MD;  [facility];  Service: Endoscopy;  Laterality: N/A;  1255   Family History  Problem Relation Age of Onset  . Asthma Mother   . Diabetes Mother   . Heart attack Father   . COPD Brother    Social History  Substance Use Topics  . Smoking status: Former Smoker -- 0.10 packs/day for 29 years    Types: Cigarettes    Quit date: 12/28/1978  . Smokeless tobacco: Never Used  . Alcohol Use: No    Review of Systems ROS: Statement: All systems negative except as marked or noted in the HPI; Constitutional: Negative for fever and chills. ; ; Eyes: Negative for eye pain, redness and discharge. ; ; ENMT: Negative for ear pain, hoarseness, nasal congestion, sinus pressure and sore throat. ; ; Cardiovascular: Negative for chest pain, palpitations, diaphoresis, dyspnea and peripheral edema. ; ; Respiratory: Negative for cough, wheezing and stridor. ; ; Gastrointestinal: Negative for nausea, vomiting, diarrhea, abdominal pain, blood in stool, hematemesis, jaundice and rectal bleeding. . ; ; Genitourinary: Negative for dysuria, flank pain and hematuria. ; ; Musculoskeletal: +right knee pain. Negative for back pain and  neck pain. Negative for swelling and deformity.; ; Skin: +abrasions left lateral upper arm. Negative for pruritus, rash, blisters, bruising and skin lesion.; ; Neuro: Negative for headache, lightheadedness and neck stiffness. Negative for weakness, altered level of consciousness , altered mental status, extremity weakness, paresthesias, involuntary movement, seizure and syncope.       Allergies  Hydrocodone  Home Medications   Prior to Admission medications   Medication Sig Start Date End Date Taking? Authorizing Provider  aspirin EC 81 MG tablet Take 1 tablet (81 mg total) by mouth daily. 03/19/16   Rogene Houston, MD  fenofibrate 160 MG tablet Take 1 tablet by mouth daily. 01/14/16   Historical Provider, MD   ferrous sulfate 325 (65 FE) MG tablet Take 325 mg by mouth daily with breakfast.    Historical Provider, MD  gabapentin (NEURONTIN) 600 MG tablet Take 1,200-2,400 mg by mouth 2 (two) times daily. 2 tablets in the morning and 4 at bedtime.    Historical Provider, MD  glimepiride (AMARYL) 2 MG tablet Take 2 mg by mouth daily before breakfast.    Historical Provider, MD  NON FORMULARY C-Pap.    Historical Provider, MD  ondansetron (ZOFRAN ODT) 4 MG disintegrating tablet Take 1 tablet (4 mg total) by mouth every 8 (eight) hours as needed for nausea or vomiting. 03/19/16   Kristen N Ward, DO  pioglitazone (ACTOS) 30 MG tablet Take 30 mg by mouth at bedtime.     Historical Provider, MD  rivastigmine (EXELON) 3 MG capsule Take 3 mg by mouth 2 (two) times daily.     Historical Provider, MD  Vitamin D, Ergocalciferol, (DRISDOL) 50000 units CAPS capsule Take 50,000 Units by mouth every 7 (seven) days.    Historical Provider, MD   BP 203/73 mmHg  Pulse 61  Temp(Src) 98.8 F (37.1 C) (Oral)  Resp 17  Ht 6\' 1"  (1.854 m)  Wt 252 lb (114.306 kg)  BMI 33.25 kg/m2  SpO2 93% Physical Exam  0735: Physical examination:  Nursing notes reviewed; Vital signs and O2 SAT reviewed;  Constitutional: Well developed, Well nourished, Well hydrated, In no acute distress; Head:  Normocephalic, atraumatic; Eyes: EOMI, PERRL, No scleral icterus; ENMT: Mouth and pharynx normal, Mucous membranes moist; Neck: Supple, Full range of motion, No lymphadenopathy; Cardiovascular: Regular rate and rhythm, No gallop; Respiratory: Breath sounds clear & equal bilaterally, No wheezes.  Speaking full sentences with ease, Normal respiratory effort/excursion; Chest: Nontender, Movement normal; Abdomen: Soft, Nontender, Nondistended, Normal bowel sounds; Genitourinary: No CVA tenderness; Spine:  No midline CS, TS, LS tenderness.;; Extremities: Pulses normal, No deformity. No edema, No calf edema or asymmetry. Pelvis stable. NT bilat hips. NT  right ankle/foot. +FROM right knee, including able to lift extended RLE off stretcher, and extend right lower leg against resistance.  No ligamentous laxity.  No patellar or quad tendon step-offs.  NMS intact right foot, strong pedal pp. +plantarflexion of right foot w/calf squeeze.  No palpable gap right Achilles's tendon.  No proximal fibular head tenderness.  No abrasions, edema, erythema, warmth, ecchymosis or deformity.  No specific area of point tenderness. NT left shoulder/elbow/wrist/hand. +multiple superficial abrasions left lateral upper arm.; Neuro: AA&Ox3, Major CN grossly intact.  Speech clear. No gross focal motor or sensory deficits in extremities.; Skin: Color normal, Warm, Dry.   ED Course  Procedures (including critical care time) Labs Review  Imaging Review  I have personally reviewed and evaluated these images and lab results as part of my medical decision-making.  EKG Interpretation None      MDM  MDM Reviewed: previous chart, nursing note and vitals Interpretation: x-ray    Dg Elbow Complete Left 04/03/2016  CLINICAL DATA:  Fall EXAM: LEFT ELBOW - COMPLETE 3+ VIEW COMPARISON:  None. FINDINGS: No fracture. No dislocation. There is a chronic appearing avulsion fracture at the medial epicondyles of the distal humerus. There is also spurring in the proximal ulna at the insertion of the triceps tendon. No definite joint effusion. IMPRESSION: No acute bony injury.  Chronic changes are noted. Electronically Signed   By: Marybelle Killings M.D.   On: 04/03/2016 08:42   Dg Knee Complete 4 Views Right 04/03/2016  CLINICAL DATA:  Right knee pain, fall this morning, lower back surgery last week EXAM: RIGHT KNEE - COMPLETE 4+ VIEW COMPARISON:  None. FINDINGS: Four views of the right knee submitted. No acute fracture or subluxation. Minimal narrowing of medial joint compartment. Moderate joint effusion. There is spurring of patella. Atherosclerotic calcifications of femoral and popliteal  artery. IMPRESSION: No acute fracture or subluxation. Mild degenerative changes. Moderate joint effusion. Spurring of patella. Atherosclerotic vascular calcifications. Electronically Signed   By: Lahoma Crocker M.D.   On: 04/03/2016 08:51   Dg Hip Unilat With Pelvis 2-3 Views Right 04/03/2016  CLINICAL DATA:  Acute right hip pain after fall today. Initial encounter. EXAM: DG HIP (WITH OR WITHOUT PELVIS) 2-3V RIGHT COMPARISON:  None. FINDINGS: Status post bilateral total hip arthroplasties. No fracture or dislocation is noted. Vascular calcifications are noted. IMPRESSION: No acute abnormality seen in the right hip. Electronically Signed   By: Marijo Conception, M.D.   On: 04/03/2016 08:44    1015:  Pt endorses his right knee "has been giving out for quite some time now" but has not been evaluated by Ortho MD for same.  XR reassuring. Pt ambulated with his walker (with back brace and knee immobilizer in place) with steady gait, easy resps, NAD. Pt states he is ready to go home now. Strongly encouraged to walk with his walker and f/u Ortho MD; verb understanding. Dx and testing d/w pt and family.  Questions answered.  Verb understanding, agreeable to d/c home with outpt f/u.   Francine Graven, DO 04/06/16 0800

## 2016-05-29 ENCOUNTER — Other Ambulatory Visit (HOSPITAL_COMMUNITY)
Admission: RE | Admit: 2016-05-29 | Discharge: 2016-05-29 | Disposition: A | Discharge status: Home / Self Care | Payer: Medicare Other | Source: Other Acute Inpatient Hospital | Attending: Internal Medicine | Admitting: Internal Medicine

## 2016-05-29 DIAGNOSIS — N390 Urinary tract infection, site not specified: Secondary | ICD-10-CM | POA: Diagnosis present

## 2016-05-29 DIAGNOSIS — Z5181 Encounter for therapeutic drug level monitoring: Secondary | ICD-10-CM | POA: Diagnosis present

## 2016-05-29 LAB — URINALYSIS, ROUTINE W REFLEX MICROSCOPIC
Bilirubin Urine: NEGATIVE
Glucose, UA: NEGATIVE mg/dL
HGB URINE DIPSTICK: NEGATIVE
Ketones, ur: NEGATIVE mg/dL
Leukocytes, UA: NEGATIVE
Nitrite: NEGATIVE
Specific Gravity, Urine: 1.025 (ref 1.005–1.030)
pH: 5.5 (ref 5.0–8.0)

## 2016-05-29 LAB — COMPREHENSIVE METABOLIC PANEL
ALBUMIN: 3.4 g/dL — AB (ref 3.5–5.0)
ALT: 13 U/L — ABNORMAL LOW (ref 17–63)
ANION GAP: 5 (ref 5–15)
AST: 18 U/L (ref 15–41)
Alkaline Phosphatase: 46 U/L (ref 38–126)
BILIRUBIN TOTAL: 0.7 mg/dL (ref 0.3–1.2)
BUN: 23 mg/dL — AB (ref 6–20)
CHLORIDE: 106 mmol/L (ref 101–111)
CO2: 27 mmol/L (ref 22–32)
Calcium: 8.5 mg/dL — ABNORMAL LOW (ref 8.9–10.3)
Creatinine, Ser: 1.35 mg/dL — ABNORMAL HIGH (ref 0.61–1.24)
GFR calc Af Amer: 56 mL/min — ABNORMAL LOW (ref >60)
GFR calc non Af Amer: 48 mL/min — ABNORMAL LOW (ref >60)
GLUCOSE: 124 mg/dL — AB (ref 65–99)
POTASSIUM: 4.7 mmol/L (ref 3.5–5.1)
SODIUM: 138 mmol/L (ref 135–145)
Total Protein: 6.1 g/dL — ABNORMAL LOW (ref 6.5–8.1)

## 2016-05-29 LAB — CBC
HEMATOCRIT: 36.4 % — AB (ref 39.0–52.0)
Hemoglobin: 11.9 g/dL — ABNORMAL LOW (ref 13.0–17.0)
MCH: 30.4 pg (ref 26.0–34.0)
MCHC: 32.7 g/dL (ref 30.0–36.0)
MCV: 92.9 fL (ref 78.0–100.0)
PLATELETS: 178 10*3/uL (ref 150–400)
RBC: 3.92 MIL/uL — ABNORMAL LOW (ref 4.22–5.81)
RDW: 13.5 % (ref 11.5–15.5)
WBC: 5 10*3/uL (ref 4.0–10.5)

## 2016-05-29 LAB — URINE MICROSCOPIC-ADD ON

## 2016-05-30 LAB — HEMOGLOBIN A1C
Hgb A1c MFr Bld: 8.6 % — ABNORMAL HIGH (ref 4.8–5.6)
Mean Plasma Glucose: 200 mg/dL

## 2016-05-31 LAB — URINE CULTURE: Culture: NO GROWTH

## 2016-07-11 IMAGING — DX DG PORTABLE PELVIS
1 series · 1 of 1 positions shown · non-contrast
Comparison: April 20, 2014

CLINICAL DATA: Status post left total hip replacement

EXAM:
DG C-ARM 1-60 MIN - NRPT [facility]; PORTABLE PELVIS 1-2 VIEWS

[pelvis ap]
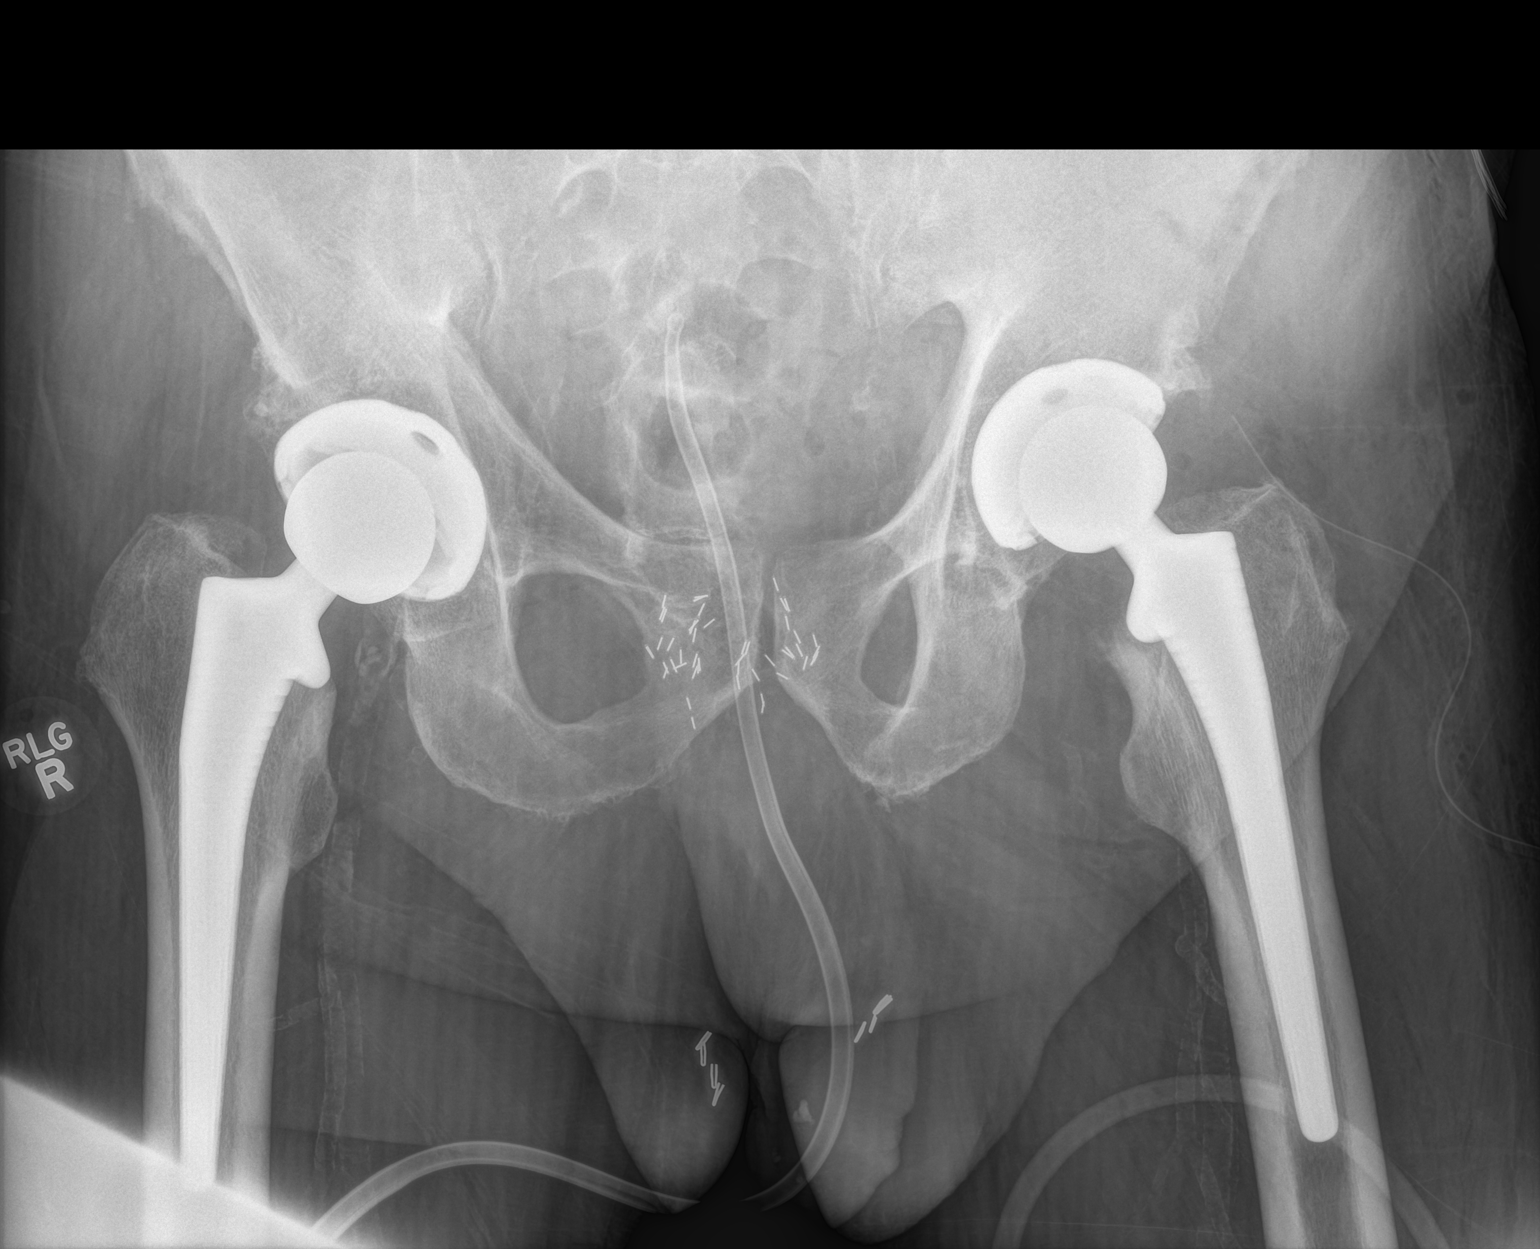

[1 of 1 positions shown; findings below may reference images not displayed]

FLUOROSCOPY TIME:  Fluoroscopy Time:  0 minutes 12 seconds

Number of Acquired Images:  1
FINDINGS: There are now total hip prostheses present bilaterally with the
prosthetic components bilaterally appearing well-seated. No acute
fracture or dislocation. There is a drain in the lateral left hip
joint. There are seed implants in the prostate region. There are
surgical clips in each scrotal region. There is a catheter in the
mid-pelvis. There are multiple foci of atherosclerotic vascular
calcification bilaterally.
IMPRESSION: Prosthetic components appear well seated on both sides. No acute
fracture or dislocation. Status post prostatic seed implants.

## 2016-07-24 ENCOUNTER — Other Ambulatory Visit (HOSPITAL_COMMUNITY)
Admission: RE | Admit: 2016-07-24 | Discharge: 2016-07-24 | Disposition: A | Discharge status: Home / Self Care | Payer: Medicare Other | Source: Other Acute Inpatient Hospital | Attending: Urology | Admitting: Urology

## 2016-07-24 ENCOUNTER — Ambulatory Visit (INDEPENDENT_AMBULATORY_CARE_PROVIDER_SITE_OTHER): Payer: Medicare Other | Admitting: Urology

## 2016-07-24 DIAGNOSIS — N5201 Erectile dysfunction due to arterial insufficiency: Secondary | ICD-10-CM | POA: Diagnosis not present

## 2016-07-24 DIAGNOSIS — Z8546 Personal history of malignant neoplasm of prostate: Secondary | ICD-10-CM | POA: Insufficient documentation

## 2016-07-24 DIAGNOSIS — N393 Stress incontinence (female) (male): Secondary | ICD-10-CM

## 2016-07-24 DIAGNOSIS — R3121 Asymptomatic microscopic hematuria: Secondary | ICD-10-CM

## 2016-07-24 DIAGNOSIS — N359 Urethral stricture, unspecified: Secondary | ICD-10-CM | POA: Diagnosis not present

## 2016-07-26 LAB — URINE CULTURE: Culture: NO GROWTH

## 2016-08-04 ENCOUNTER — Ambulatory Visit (HOSPITAL_COMMUNITY): Payer: Medicare Other | Attending: Neurosurgery | Admitting: Physical Therapy

## 2016-08-04 ENCOUNTER — Encounter (HOSPITAL_COMMUNITY): Payer: Self-pay | Admitting: Physical Therapy

## 2016-08-04 DIAGNOSIS — M6281 Muscle weakness (generalized): Secondary | ICD-10-CM | POA: Diagnosis not present

## 2016-08-04 DIAGNOSIS — R2681 Unsteadiness on feet: Secondary | ICD-10-CM | POA: Diagnosis present

## 2016-08-04 DIAGNOSIS — R29898 Other symptoms and signs involving the musculoskeletal system: Secondary | ICD-10-CM

## 2016-08-04 DIAGNOSIS — R2689 Other abnormalities of gait and mobility: Secondary | ICD-10-CM | POA: Insufficient documentation

## 2016-08-04 DIAGNOSIS — R262 Difficulty in walking, not elsewhere classified: Secondary | ICD-10-CM | POA: Diagnosis present

## 2016-08-04 NOTE — Therapy (Signed)
[place], [place], [address] Phone: [phone]   Fax:  [phone]  Physical Therapy Evaluation  Patient Details  Name: Manuel [name] MRN: [id number] Date of Birth: [birth year] Referring Provider: Glenna Fellows   Encounter Date: 08/04/2016      PT End of Session - 08/04/16 1753    Visit Number 1   Number of Visits 16   Date for PT Re-Evaluation 09/01/16   Authorization Type [employer]    Authorization Time Period 08/04/16 to 10/04/16   Authorization - Visit Number 1   Authorization - Number of Visits 10   PT Start Time [id number]   PT Stop Time 1513   PT Time Calculation (min) 39 min   Activity Tolerance Patient tolerated treatment well   Behavior During Therapy [facility] for tasks assessed/performed      Past Medical History:  Diagnosis Date  . Arthritis   . Assistance needed for mobility    walker  . Back pain   . Cancer (HCC)    PROSTATE -- RADIATION SEED IMPLANT  . Diabetes mellitus    DX SINCE 1998  . Heart murmur   . Hypertension   . Poor balance   . Sleep apnea     Past Surgical History:  Procedure Laterality Date  . BACK SURGERY    . CHOLECYSTECTOMY    . COLONOSCOPY N/A 03/13/2016   Procedure: COLONOSCOPY;  Surgeon: Rogene Houston, MD;  [facility];  Service: Endoscopy;  Laterality: N/A;  1255  . CYSTOSCOPY WITH URETHRAL DILATATION N/A 04/20/2014   Procedure: CYSTOSCOPY WITH URETHRAL DILATATION AND FOLEY PLACEMENT;  Surgeon: Gearlean Alf, MD;  Location: [place];  Service: Orthopedics;  Laterality: N/A;  . EYE SURGERY     CATARACT BOTH EYES 2014  . Prostate radiation seed implantation    . PROSTATE SURGERY     with seed implants  . TOTAL HIP ARTHROPLASTY Right 04/20/2014   Procedure: RIGHT TOTAL HIP ARTHROPLASTY ANTERIOR APPROACH;  Surgeon: Gearlean Alf, MD;  Location: [place];  Service: Orthopedics;  Laterality: Right;  . TOTAL HIP ARTHROPLASTY Left 08/14/2015   Procedure: LEFT TOTAL HIP  ARTHROPLASTY ANTERIOR APPROACH;  Surgeon: Gaynelle Arabian, MD;  [facility];  Service: Orthopedics;  Laterality: Left;  . TRANSURETHRAL RESECTION OF PROSTATE  2012  . VASECTOMY    . VASECTOMY      There were no vitals filed for this visit.       Subjective Assessment - 08/04/16 1440    Subjective Patient had surgery on his back in April 2017, which caused him to lose strength in his R leg with complete functional loss of use of leg; he had inpatient rehab for 5 weeks, then HHPT for 8 weeks. His R leg is still very weak, especally the quad, and his knee buckles easy; he is not wearing any braces. He cannot walk without assistive device, he cannot drive yet; he can do mostly everything except lacing his shoes. He can get out of shower with a shower chair, everything is fairly OK. He has lost quite a bit of weight as well. He has had about half a dozen falls due to his leg buckling, his most recent fall was last friday when he was trying to walk to his walker outside.    Pertinent History HTN, DM, OSA, BPH, hx of L3-L4 surgery, eye problems with blurred vision, B anterior hips    How long can you  sit comfortably? 8/8- 2 hours    How long can you stand comfortably? 8/8- just a couple minutes with walker   How long can you walk comfortably? 8/8- over 136ft on level surface with walker    Patient Stated Goals get strength back, get walking better and potentially get rid of device    Currently in Pain? No/denies            [facility] PT Assessment - 08/04/16 0001      Assessment   Medical Diagnosis R leg weakness    Referring Provider Glenna Fellows    Onset Date/Surgical Date --  April 2017   Next MD Visit Dr. Willey Blade next Friday, Dr. Carloyn Manner end of August      Precautions   Precaution Comments patient reports no known precautions from surgeon      Balance Screen   Has the patient fallen in the past 6 months Yes   How many times? 6    Has the patient had a decrease in activity level because of a  fear of falling?  Yes   Is the patient reluctant to leave their home because of a fear of falling?  Yes     Prior Function   Level of Independence Independent;Independent with basic ADLs;Independent with transfers;Independent with gait   Vocation Retired   [employer] work     Observation/Other Assessments   Observations open wounds noted bilatearl knees, covered with New Skin per patient; slough noted L knee    Focus on Therapeutic Outcomes (FOTO)  73% limited      Strength   Right Hip Flexion 2/5   Right Hip ABduction 2/5   Left Hip Flexion 4/5   Left Hip ABduction 2+/5   Right Knee Flexion 3+/5   Right Knee Extension 2/5   Left Knee Flexion 4/5   Left Knee Extension 5/5   Right Ankle Dorsiflexion 3/5   Left Ankle Dorsiflexion 5/5     Ambulation/Gait   Gait Comments reduced gait speed, some hyperextension R knee, reduced stance time R LE, flexed at hips      6 minute walk test results    Aerobic Endurance Distance Walked 215   Endurance additional comments 3MWT, walker      High Level Balance   High Level Balance Comments 23.37 with walker                            PT Education - 08/04/16 1752    Education provided Yes   Education Details prognosis, POC, modifications to HHPT HEP to optimize strength gain, use neosporin and gauze on wound L LE and get to MD if it does not appear to be healing    Person(s) Educated Patient   Methods Explanation   Comprehension Verbalized understanding          PT Short Term Goals - 08/04/16 1756      PT SHORT TERM GOAL #1   Title Patient to be able to ambulate 440ft in 3MWT with LRAD in order to demonstrate improved mobility and function    Time 4   Period Weeks   Status New     PT SHORT TERM GOAL #2   Title Patient to maintain correct posture at least 75% of the time in order to maintain low levels of pain and improve balance    Time 4   Period Weeks   Status New     PT SHORT  TERM GOAL #3   Title  Patient to demonstrate ability to complete TUG in 15 seconds or less with LRAD in order to show improved balance/reduced fall risk    Time 4   Period Weeks   Status New     PT SHORT TERM GOAL #4   Title Patient to be able to correctly and consistently perform appropriate HEP, to be updated PRN    Time 4   Period Weeks   Status New           PT Long Term Goals - 08/04/16 1758      PT LONG TERM GOAL #1   Title Patient to score at least 4-/5 in all tested muscle groups in R LE and 5/5 in all other tested muscles in order to improve mobilty and gait    Time 8   Period Weeks   Status New     PT LONG TERM GOAL #2   Title Patient to demonstrate correct functional mechanics for all functional tasks, including bed mobility and functional lifting, in order to protect lumbar area and reduce fall risk    Time 8   Period Weeks   Status New     PT LONG TERM GOAL #3   Title Patient to be able to ambulate at least 690ft during 3MWT with LRAD in order to show improved overall mobility    Time 8   Period Weeks   Status New     PT LONG TERM GOAL #4   Title Patient to be able to maintain SLS B LEs at least 30 seconds in order to show improved balance skills and reduced fall risk    Time 8   Period Weeks   Status New     PT LONG TERM GOAL #5   Title Patient to be participatory in regular exercise program, at least 20 minutes in duration, at least 4 days per week, in order to maintain functional gains and assist in improving overall health status    Time [place] - 08/04/16 1754    Clinical Impression Statement Patient presents status-post lumbar surgery that was done in April 2017 and left him with complete inability to functionally use his R LE. He has gone through extensive inpatient and HHPT services and is now presenting to OP PT to continue his course of care. Upon examination, noted bilateral open wounds on his anterior knees, the L  of which appeared to be covered in slough; advised patient to monitor and use Neosporin/gauze to assist in catching drainage, speak to MD if it is not healing well. He also demonstrates significant functional weakness, postural and gait impairment, reduced functional activity tolerance, unsteadiness, and does present as a fall risk at this time. He will benefit from skilled PT services in order to address functional limitations and assist in reaching optimal level of function with minimal fall risk.    Rehab Potential Good   PT Frequency 2x / week   PT Duration 8 weeks   PT Treatment/Interventions ADLs/Self Care Home Management;Cryotherapy;Moist Heat;Electrical Stimulation;Gait training;DME Instruction;Stair training;Functional mobility training;Therapeutic activities;Therapeutic exercise;Balance training;Neuromuscular re-education;Patient/family education;Manual techniques;Passive range of motion;Energy conservation;Taping   PT Next Visit Plan focus on functional strength especially for R LE, gait and balance, patient specifically requests Nustep as much as possible    PT Home Exercise Plan HHPT program for now   Consulted  and Agree with Plan of Care Patient      Patient will benefit from skilled therapeutic intervention in order to improve the following deficits and impairments:  Abnormal gait, Improper body mechanics, Decreased coordination, Decreased mobility, Postural dysfunction, Decreased activity tolerance, Decreased strength, Decreased balance, Difficulty walking, Decreased endurance, Cardiopulmonary status limiting activity  Visit Diagnosis: Muscle weakness (generalized) - Plan: PT plan of care cert/re-cert  Unsteadiness on feet - Plan: PT plan of care cert/re-cert  Difficulty in walking, not elsewhere classified - Plan: PT plan of care cert/re-cert  Other symptoms and signs involving the musculoskeletal system - Plan: PT plan of care cert/re-cert      G-Codes - [id number]     Functional Assessment Tool Used Based on skilled clinical assessment of strength, gait, mobility, balance    Functional Limitation Mobility: Walking and moving around   Mobility: Walking and Moving Around Current Status [id number]) At least 60 percent but less than 80 percent impaired, limited or restricted   Mobility: Walking and Moving Around Goal Status [phone]) At least 40 percent but less than 60 percent impaired, limited or restricted       Problem List Patient Active Problem List   Diagnosis Date Noted  . Morbid obesity ([place]) 07/30/2015  . OSA (obstructive sleep apnea) 01/22/2015  . OA (osteoarthritis) of hip 04/20/2014  . Bladder neck contracture 04/20/2014  . Lumbago 07/05/2012  . Spondylosis with myelopathy, lumbar region 07/05/2012  . Degenerative arthritis of hip 09/22/2011  . Degenerative disc disease, lumbar 09/22/2011  . Acute on chronic renal failure ([place]) 08/07/2011  . BPH (benign prostatic hyperplasia) 08/07/2011  . Prostate cancer ([place]) 08/07/2011  . Hypertension 08/07/2011  . Diabetes mellitus type 2 with complications ([place]) [id number]  . Lumbar disc disease 08/07/2011    Hunt Oris 08/04/2016, 6:07 PM  [place], [place], [address] Phone: [phone]   Fax:  [phone]  Name: Manuel [name] MRN: [id number] Date of Birth: 1935-09-28

## 2016-08-06 ENCOUNTER — Ambulatory Visit (HOSPITAL_COMMUNITY): Payer: Medicare Other

## 2016-08-06 DIAGNOSIS — R2681 Unsteadiness on feet: Secondary | ICD-10-CM

## 2016-08-06 DIAGNOSIS — M6281 Muscle weakness (generalized): Secondary | ICD-10-CM | POA: Diagnosis not present

## 2016-08-06 DIAGNOSIS — R262 Difficulty in walking, not elsewhere classified: Secondary | ICD-10-CM

## 2016-08-06 DIAGNOSIS — R29898 Other symptoms and signs involving the musculoskeletal system: Secondary | ICD-10-CM

## 2016-08-06 NOTE — Therapy (Signed)
[facility] [address] [place], [place], [address] Phone: [phone]   Fax:  [phone]  Physical Therapy Treatment  Patient Details  Name: Manuel [name] MRN: [id number] Date of Birth: Jan 11, 1935 Referring Provider: Glenna Fellows   Encounter Date: 08/06/2016      PT End of Session - 08/06/16 1443    Visit Number 2   Number of Visits 16   Date for PT Re-Evaluation 09/01/16   Authorization Type [employer]    Authorization Time Period 08/04/16 to 10/04/16   Authorization - Visit Number 2   Authorization - Number of Visits 10   PT Start Time [id number]   PT Stop Time 1516   PT Time Calculation (min) 38 min   Activity Tolerance Patient tolerated treatment well   Behavior During Therapy [facility] for tasks assessed/performed      Past Medical History:  Diagnosis Date  . Arthritis   . Assistance needed for mobility    walker  . Back pain   . Cancer (HCC)    PROSTATE -- RADIATION SEED IMPLANT  . Diabetes mellitus    DX SINCE 1998  . Heart murmur   . Hypertension   . Poor balance   . Sleep apnea     Past Surgical History:  Procedure Laterality Date  . BACK SURGERY    . CHOLECYSTECTOMY    . COLONOSCOPY N/A 03/13/2016   Procedure: COLONOSCOPY;  Surgeon: Rogene Houston, MD;  [facility];  Service: Endoscopy;  Laterality: N/A;  1255  . CYSTOSCOPY WITH URETHRAL DILATATION N/A 04/20/2014   Procedure: CYSTOSCOPY WITH URETHRAL DILATATION AND FOLEY PLACEMENT;  Surgeon: Gearlean Alf, MD;  Location: [facility];  Service: Orthopedics;  Laterality: N/A;  . EYE SURGERY     CATARACT BOTH EYES 2014  . Prostate radiation seed implantation    . PROSTATE SURGERY     with seed implants  . TOTAL HIP ARTHROPLASTY Right 04/20/2014   Procedure: RIGHT TOTAL HIP ARTHROPLASTY ANTERIOR APPROACH;  Surgeon: Gearlean Alf, MD;  Location: [facility];  Service: Orthopedics;  Laterality: Right;  . TOTAL HIP ARTHROPLASTY Left 08/14/2015   Procedure: LEFT TOTAL HIP  ARTHROPLASTY ANTERIOR APPROACH;  Surgeon: Gaynelle Arabian, MD;  [facility];  Service: Orthopedics;  Laterality: Left;  . TRANSURETHRAL RESECTION OF PROSTATE  2012  . VASECTOMY    . VASECTOMY      There were no vitals filed for this visit.      Subjective Assessment - 08/06/16 1435    Subjective Pt stated he is feeling good today, no reports of pain.  Reports complaince with HEP 2-3 times daily.  Main difficulty with quad weakness and his knee buckles easily.  Pt reports no falls in the last 2 weeks.   Pertinent History HTN, DM, OSA, BPH, hx of L3-L4 surgery, eye problems with blurred vision, B anterior hips    Patient Stated Goals get strength back, get walking better and potentially get rid of device    Currently in Pain? No/denies                         [facility] Adult PT Treatment/Exercise - 08/06/16 0001      Exercises   Exercises Knee/Hip     Knee/Hip Exercises: Standing   Other Standing Knee Exercises Standing marching inside RW 10x 5"     Knee/Hip Exercises: Supine   Quad Sets Right;2 sets;10 reps   [employer]  Sets Limitations tactile cueing to improve activation   Heel Slides 15 reps   Bridges 15 reps   Other Supine Knee/Hip Exercises supine bent knee raise 10x 5" holds   Other Supine Knee/Hip Exercises supine abduction AAROM Rt LE cueing to reduce ER                  PT Short Term Goals - 08/04/16 1756      PT SHORT TERM GOAL #1   Title Patient to be able to ambulate 433ft in 3MWT with LRAD in order to demonstrate improved mobility and function    Time 4   Period Weeks   Status New     PT SHORT TERM GOAL #2   Title Patient to maintain correct posture at least 75% of the time in order to maintain low levels of pain and improve balance    Time 4   Period Weeks   Status New     PT SHORT TERM GOAL #3   Title Patient to demonstrate ability to complete TUG in 15 seconds or less with LRAD in order to show improved balance/reduced fall risk     Time 4   Period Weeks   Status New     PT SHORT TERM GOAL #4   Title Patient to be able to correctly and consistently perform appropriate HEP, to be updated PRN    Time 4   Period Weeks   Status New           PT Long Term Goals - 08/04/16 1758      PT LONG TERM GOAL #1   Title Patient to score at least 4-/5 in all tested muscle groups in R LE and 5/5 in all other tested muscles in order to improve mobilty and gait    Time 8   Period Weeks   Status New     PT LONG TERM GOAL #2   Title Patient to demonstrate correct functional mechanics for all functional tasks, including bed mobility and functional lifting, in order to protect lumbar area and reduce fall risk    Time 8   Period Weeks   Status New     PT LONG TERM GOAL #3   Title Patient to be able to ambulate at least 627ft during 3MWT with LRAD in order to show improved overall mobility    Time 8   Period Weeks   Status New     PT LONG TERM GOAL #4   Title Patient to be able to maintain SLS B LEs at least 30 seconds in order to show improved balance skills and reduced fall risk    Time 8   Period Weeks   Status New     PT LONG TERM GOAL #5   Title Patient to be participatory in regular exercise program, at least 20 minutes in duration, at least 4 days per week, in order to maintain functional gains and assist in improving overall health status    Time 8   Period Weeks   Status New               Plan - 08/06/16 1621    Clinical Impression Statement Reviewed goals, reviewed HEP currently completeing at home and copy of eval given.  Session focus on improving strengthening primarly Rt LE quad and gluteal musculature.  Established new HEP with primary focus on these weak musculature,  Pt able to complete all exercises with AROM/AAROM due to weakness with no reports  of pain through session.  Therapist facilitatin for proper form with therex.     Rehab Potential Good   PT Frequency 2x / week   PT Duration 8 weeks    PT Treatment/Interventions ADLs/Self Care Home Management;Cryotherapy;Moist Heat;Electrical Stimulation;Gait training;DME Instruction;Stair training;Functional mobility training;Therapeutic activities;Therapeutic exercise;Balance training;Neuromuscular re-education;Patient/family education;Manual techniques;Passive range of motion;Energy conservation;Taping   PT Next Visit Plan focus on functional strength especially for R LE, gait and balance, patient specifically requests Nustep as much as possible       Patient will benefit from skilled therapeutic intervention in order to improve the following deficits and impairments:  Abnormal gait, Improper body mechanics, Decreased coordination, Decreased mobility, Postural dysfunction, Decreased activity tolerance, Decreased strength, Decreased balance, Difficulty walking, Decreased endurance, Cardiopulmonary status limiting activity  Visit Diagnosis: Muscle weakness (generalized)  Unsteadiness on feet  Difficulty in walking, not elsewhere classified  Other symptoms and signs involving the musculoskeletal system     Problem List Patient Active Problem List   Diagnosis Date Noted  . Morbid obesity ([place]) 07/30/2015  . OSA (obstructive sleep apnea) 01/22/2015  . OA (osteoarthritis) of hip 04/20/2014  . Bladder neck contracture 04/20/2014  . Lumbago 07/05/2012  . Spondylosis with myelopathy, lumbar region 07/05/2012  . Degenerative arthritis of hip 09/22/2011  . Degenerative disc disease, lumbar 09/22/2011  . Acute on chronic renal failure ([place]) 08/07/2011  . BPH (benign prostatic hyperplasia) 08/07/2011  . Prostate cancer ([place]) 08/07/2011  . Hypertension 08/07/2011  . Diabetes mellitus type 2 with complications ([place]) [id number]  . Lumbar disc disease 08/07/2011   Ihor Austin, [place]; [place]  Aldona Lento 08/06/2016, 4:27 PM  [facility] [place],  [place], [address] Phone: [phone]   Fax:  [phone]  Name: Manuel [name] MRN: [id number] Date of Birth: May 26, 1935

## 2016-08-06 NOTE — Patient Instructions (Addendum)
Bridging    Slowly raise buttocks from floor, keeping stomach tight. Repeat 15 times per set. Do 2 sets per session. Do at least 4 days per week http://orth.exer.us/1096   Copyright  VHI. All rights reserved.   Quad [employer] top of left thigh. Hold for 5-10 seconds. Repeat 10 times. Do 5-10 times a day.   Copyright  VHI. All rights reserved.   Heel Slides    Slide right heel along bed towards bottom. Slide back to flat knee position. Repeat 10 times. Do multiple times a day. Repeat with other leg.  Copyright  VHI. All rights reserved.   Bent Leg Lift (Hook-Lying)    Tighten stomach and slowly raise right leg 12 inches from floor. Keep trunk rigid. Hold 5 seconds. Repeat 10-20 times per set. Do 1-2 sets per session. Do at least 4 days per week.  http://orth.exer.us/1090   Copyright  VHI. All rights reserved.   Abduction    Slide one leg out to side. Keep kneecap pointing up. Gently bring leg back to pillow. Repeat with other leg. Repeat 10-20 times. Do 1-2 sessions per day.  http://gt2.exer.us/373   Copyright  VHI. All rights reserved.

## 2016-08-07 ENCOUNTER — Encounter (HOSPITAL_COMMUNITY): Payer: Self-pay | Admitting: Emergency Medicine

## 2016-08-07 ENCOUNTER — Emergency Department (HOSPITAL_COMMUNITY): Payer: Medicare Other

## 2016-08-07 ENCOUNTER — Emergency Department (HOSPITAL_COMMUNITY)
Admission: EM | Admit: 2016-08-07 | Discharge: 2016-08-07 | Disposition: A | Discharge status: Home / Self Care | Payer: Medicare Other | Attending: Emergency Medicine | Admitting: Emergency Medicine

## 2016-08-07 DIAGNOSIS — Z87891 Personal history of nicotine dependence: Secondary | ICD-10-CM | POA: Insufficient documentation

## 2016-08-07 DIAGNOSIS — K122 Cellulitis and abscess of mouth: Secondary | ICD-10-CM | POA: Diagnosis not present

## 2016-08-07 DIAGNOSIS — E119 Type 2 diabetes mellitus without complications: Secondary | ICD-10-CM | POA: Insufficient documentation

## 2016-08-07 DIAGNOSIS — I10 Essential (primary) hypertension: Secondary | ICD-10-CM | POA: Insufficient documentation

## 2016-08-07 DIAGNOSIS — Z79899 Other long term (current) drug therapy: Secondary | ICD-10-CM | POA: Insufficient documentation

## 2016-08-07 DIAGNOSIS — N281 Cyst of kidney, acquired: Secondary | ICD-10-CM | POA: Insufficient documentation

## 2016-08-07 DIAGNOSIS — L0291 Cutaneous abscess, unspecified: Secondary | ICD-10-CM

## 2016-08-07 DIAGNOSIS — L0390 Cellulitis, unspecified: Secondary | ICD-10-CM

## 2016-08-07 DIAGNOSIS — Z794 Long term (current) use of insulin: Secondary | ICD-10-CM | POA: Diagnosis not present

## 2016-08-07 LAB — MRSA PCR SCREENING: MRSA BY PCR: NEGATIVE

## 2016-08-07 LAB — BASIC METABOLIC PANEL
Anion gap: 4 — ABNORMAL LOW (ref 5–15)
BUN: 25 mg/dL — AB (ref 6–20)
CALCIUM: 8.1 mg/dL — AB (ref 8.9–10.3)
CO2: 25 mmol/L (ref 22–32)
CREATININE: 1.56 mg/dL — AB (ref 0.61–1.24)
Chloride: 107 mmol/L (ref 101–111)
GFR, EST AFRICAN AMERICAN: 47 mL/min — AB (ref >60)
GFR, EST NON AFRICAN AMERICAN: 41 mL/min — AB (ref >60)
Glucose, Bld: 201 mg/dL — ABNORMAL HIGH (ref 65–99)
Potassium: 4.3 mmol/L (ref 3.5–5.1)
SODIUM: 136 mmol/L (ref 135–145)

## 2016-08-07 LAB — CBC WITH DIFFERENTIAL/PLATELET
BASOS PCT: 0 %
Basophils Absolute: 0 10*3/uL (ref 0.0–0.1)
EOS ABS: 0.2 10*3/uL (ref 0.0–0.7)
EOS PCT: 3 %
HCT: 34.4 % — ABNORMAL LOW (ref 39.0–52.0)
Hemoglobin: 11.3 g/dL — ABNORMAL LOW (ref 13.0–17.0)
LYMPHS ABS: 1 10*3/uL (ref 0.7–4.0)
Lymphocytes Relative: 14 %
MCH: 30.4 pg (ref 26.0–34.0)
MCHC: 32.8 g/dL (ref 30.0–36.0)
MCV: 92.5 fL (ref 78.0–100.0)
MONOS PCT: 10 %
Monocytes Absolute: 0.7 10*3/uL (ref 0.1–1.0)
NEUTROS PCT: 73 %
Neutro Abs: 5.1 10*3/uL (ref 1.7–7.7)
PLATELETS: 185 10*3/uL (ref 150–400)
RBC: 3.72 MIL/uL — ABNORMAL LOW (ref 4.22–5.81)
RDW: 13.9 % (ref 11.5–15.5)
WBC: 7.1 10*3/uL (ref 4.0–10.5)

## 2016-08-07 MED ORDER — SODIUM CHLORIDE 0.9 % IV BOLUS (SEPSIS)
500.0000 mL | Freq: Once | INTRAVENOUS | Status: AC
  Administered 2016-08-07: 500 mL via INTRAVENOUS

## 2016-08-07 MED ORDER — IOPAMIDOL (ISOVUE-300) INJECTION 61%
100.0000 mL | Freq: Once | INTRAVENOUS | Status: AC | PRN
  Administered 2016-08-07: 100 mL via INTRAVENOUS

## 2016-08-07 MED ORDER — CIPROFLOXACIN IN D5W 400 MG/200ML IV SOLN
400.0000 mg | Freq: Once | INTRAVENOUS | Status: AC
  Administered 2016-08-07: 400 mg via INTRAVENOUS
  Filled 2016-08-07: qty 200

## 2016-08-07 MED ORDER — METRONIDAZOLE IN NACL 5-0.79 MG/ML-% IV SOLN
500.0000 mg | Freq: Once | INTRAVENOUS | Status: AC
  Administered 2016-08-07: 500 mg via INTRAVENOUS

## 2016-08-07 MED ORDER — METRONIDAZOLE IN NACL 5-0.79 MG/ML-% IV SOLN
INTRAVENOUS | Status: AC
  Filled 2016-08-07: qty 100

## 2016-08-07 MED ORDER — DIATRIZOATE MEGLUMINE & SODIUM 66-10 % PO SOLN
ORAL | Status: DC
Start: 2016-08-07 — End: 2016-08-08
  Filled 2016-08-07: qty 30

## 2016-08-07 MED ORDER — CIPROFLOXACIN HCL 500 MG PO TABS: 500.0000 mg | ORAL_TABLET | Freq: Two times a day (BID) | ORAL | 0 refills | Status: DC

## 2016-08-07 MED ORDER — SODIUM CHLORIDE 0.9 % IV SOLN: INTRAVENOUS | Status: DC

## 2016-08-07 MED ORDER — METRONIDAZOLE 500 MG PO TABS: 500.0000 mg | ORAL_TABLET | Freq: Two times a day (BID) | ORAL | 0 refills | Status: DC

## 2016-08-07 NOTE — ED Notes (Signed)
Lab at bedside

## 2016-08-07 NOTE — ED Triage Notes (Signed)
Pt sent by Dr. Willey Blade for abscesses on both sides of buttocks and small abscess to left groin area.  Pt is going to be seen by Dr. Arnoldo Morale to see if any surgical intervention is needed.

## 2016-08-07 NOTE — ED Notes (Signed)
MD at bedside. 

## 2016-08-07 NOTE — ED Notes (Signed)
Dr. Jenkins at bedside. 

## 2016-08-07 NOTE — ED Notes (Signed)
Spoke with Dr. Willey Blade states he has spoken to Dr. Arnoldo Morale about patient and Arnoldo Morale will be coming to see patient.

## 2016-08-07 NOTE — Discharge Instructions (Signed)
Follow-up with Dr. Arnoldo Morale office on Monday. Take antibiotics as directed. Return for any new or worse symptoms over the weekend to include fever or any spreading of the redness. CT scan showed no evidence of any deep abscesses. MRSA test was negative.

## 2016-08-07 NOTE — Consult Note (Signed)
Reason for Consult: Perianal abscesses Referring Physician: Dr. Rogene Houston, Manuel [name] is an 81 y.o. male.  HPI: Patient is a 81 year old white male who was seen by Dr. Willey Blade earlier today and found to have perianal skin irritations with induration. The patient states he had had an episode of purulent drainage from one of these, but another one was developing. He was sent to the emergency room for further evaluation treatment. He states he has had these occur over the past few weeks. He had one on the left side which did drain. All fluid. He has some tenderness on the right side of the perianal region due to worsening tenderness and swelling. No purulent drainage has been noted. He has never had this before. He states he did have a fall several weeks ago. He last had a colonoscopy in 2017 by Dr. Vincente Liberty which was unremarkable. He denies any fever or chills.  Past Medical History:  Diagnosis Date  . Arthritis   . Assistance needed for mobility    walker  . Back pain   . Cancer (HCC)    PROSTATE -- RADIATION SEED IMPLANT  . Diabetes mellitus    DX SINCE 1998  . Heart murmur   . Hypertension   . Poor balance   . Sleep apnea     Past Surgical History:  Procedure Laterality Date  . BACK SURGERY    . CHOLECYSTECTOMY    . COLONOSCOPY N/A 03/13/2016   Procedure: COLONOSCOPY;  Surgeon: Rogene Houston, MD;  [facility];  Service: Endoscopy;  Laterality: N/A;  1255  . CYSTOSCOPY WITH URETHRAL DILATATION N/A 04/20/2014   Procedure: CYSTOSCOPY WITH URETHRAL DILATATION AND FOLEY PLACEMENT;  Surgeon: Gearlean Alf, MD;  Location: [facility];  Service: Orthopedics;  Laterality: N/A;  . EYE SURGERY     CATARACT BOTH EYES 2014  . Prostate radiation seed implantation    . PROSTATE SURGERY     with seed implants  . TOTAL HIP ARTHROPLASTY Right 04/20/2014   Procedure: RIGHT TOTAL HIP ARTHROPLASTY ANTERIOR APPROACH;  Surgeon: Gearlean Alf, MD;  Location: [facility];  Service: Orthopedics;   Laterality: Right;  . TOTAL HIP ARTHROPLASTY Left 08/14/2015   Procedure: LEFT TOTAL HIP ARTHROPLASTY ANTERIOR APPROACH;  Surgeon: Gaynelle Arabian, MD;  [facility];  Service: Orthopedics;  Laterality: Left;  . TRANSURETHRAL RESECTION OF PROSTATE  2012  . VASECTOMY    . VASECTOMY      Family History  Problem Relation Age of Onset  . Asthma Mother   . Diabetes Mother   . Heart attack Father   . COPD Brother     Social History:  reports that he quit smoking about 37 years ago. His smoking use included Cigarettes. He has a 2.90 pack-year smoking history. He has never used smokeless tobacco. He reports that he does not drink alcohol or use drugs.  Allergies:  Allergies  Allergen Reactions  . Hydrocodone Nausea And Vomiting    Pt reports if he takes nausea medicine with Hydrocodone, then he can take it.    Medications: Prior to Admission:  (Not in a hospital admission)  Results for orders placed or performed during the hospital encounter of 08/07/16 (from the past 48 hour(s))  CBC with Differential     Status: Abnormal   Collection Time: 08/07/16  3:01 PM  Result Value Ref Range   WBC 7.1 4.0 - 10.5 K/uL   RBC 3.72 (L) 4.22 - 5.81 MIL/uL  Hemoglobin 11.3 (L) 13.0 - 17.0 g/dL   HCT 34.4 (L) 39.0 - 52.0 %   MCV 92.5 78.0 - 100.0 fL   MCH 30.4 26.0 - 34.0 pg   MCHC 32.8 30.0 - 36.0 g/dL   RDW 13.9 11.5 - 15.5 %   Platelets 185 150 - 400 K/uL   Neutrophils Relative % 73 %   Neutro Abs 5.1 1.7 - 7.7 K/uL   Lymphocytes Relative 14 %   Lymphs Abs 1.0 0.7 - 4.0 K/uL   Monocytes Relative 10 %   Monocytes Absolute 0.7 0.1 - 1.0 K/uL   Eosinophils Relative 3 %   Eosinophils Absolute 0.2 0.0 - 0.7 K/uL   Basophils Relative 0 %   Basophils Absolute 0.0 0.0 - 0.1 K/uL  Basic metabolic panel     Status: Abnormal   Collection Time: 08/07/16  3:01 PM  Result Value Ref Range   Sodium 136 135 - 145 mmol/L   Potassium 4.3 3.5 - 5.1 mmol/L   Chloride 107 101 - 111 mmol/L   CO2 25 22  - 32 mmol/L   Glucose, Bld 201 (H) 65 - 99 mg/dL   BUN 25 (H) 6 - 20 mg/dL   Creatinine, Ser 1.56 (H) 0.61 - 1.24 mg/dL   Calcium 8.1 (L) 8.9 - 10.3 mg/dL   GFR calc non Af Amer 41 (L) >60 mL/min   GFR calc Af Amer 47 (L) >60 mL/min    Comment: (NOTE) The eGFR has been calculated using the CKD EPI equation. This calculation has not been validated in all clinical situations. eGFR's persistently <60 mL/min signify possible Chronic Kidney Disease.    Anion gap 4 (L) 5 - 15    No results found.  ROS:  Pertinent items noted in HPI and remainder of comprehensive ROS otherwise negative.  Blood pressure 155/58, pulse (!) 55, temperature 99.6 F (37.6 C), temperature source Oral, resp. rate 19, height '6\' 1"'$  (1.854 m), weight 94.8 kg (209 lb), SpO2 98 %. Physical Exam: Pleasant well-developed well-nourished white male in no acute distress. Head is normocephalic atraumatic. Lungs: Auscultation with breath sounds bilaterally. Heart examination reveals regular rate and rhythm without S3, S4, murmurs. Rectal examination reveals 2 areas of linear induration with a small superficial ulceration along the right and left aspect of the anus. There is no extension into the rectum. No. Rectal induration is noted. No purulent drainage is noted. No fluctuance is noted.  Assessment/Plan: Impression: Perianal cellulitis.  No fluctuance is present to warrant incision and drainage at this time. Agree with getting a CT scan to further assess depth. If the CT scan is okay, would discharge home on ciprofloxacin and Flagyl. Patient was instructed to follow-up in my office next week.  Ayianna Darnold A 08/07/2016, 4:47 PM

## 2016-08-07 NOTE — ED Provider Notes (Signed)
[facility] DEPT Provider Note   CSN: [id number] Arrival date & time: 08/07/16  1237  First Provider Contact:  First MD Initiated Contact with Patient 08/07/16 1431        History   Chief Complaint Chief Complaint  Patient presents with  . Abscess    HPI Manuel [name] is a 81 y.o. male.  A patient referred in now by Dr. Ria Comment off this for multiple abscesses in the perianal area. Everything started this week. Dr. Ria Comment office had contacted Dr. Arnoldo Morale who was in surgery or him to meet him here in the emergency department. Dr. Arnoldo Morale got tied up in surgery so we started the workup. Patient denies any fevers nausea vomiting or diarrhea. States everything started this week. Is gotten multiple cyst in the perianal area with redness and some mild purulent discharge. No significant abdominal pain. No history of similar problem. Patient is a diabetic.      Past Medical History:  Diagnosis Date  . Arthritis   . Assistance needed for mobility    walker  . Back pain   . Cancer (HCC)    PROSTATE -- RADIATION SEED IMPLANT  . Diabetes mellitus    DX SINCE 1998  . Heart murmur   . Hypertension   . Poor balance   . Sleep apnea     Patient Active Problem List   Diagnosis Date Noted  . Morbid obesity ([place]) 07/30/2015  . OSA (obstructive sleep apnea) 01/22/2015  . OA (osteoarthritis) of hip 04/20/2014  . Bladder neck contracture 04/20/2014  . Lumbago 07/05/2012  . Spondylosis with myelopathy, lumbar region 07/05/2012  . Degenerative arthritis of hip 09/22/2011  . Degenerative disc disease, lumbar 09/22/2011  . Acute on chronic renal failure ([place]) 08/07/2011  . BPH (benign prostatic hyperplasia) 08/07/2011  . Prostate cancer ([place]) 08/07/2011  . Hypertension 08/07/2011  . Diabetes mellitus type 2 with complications ([place]) 123456  . Lumbar disc disease 08/07/2011    Past Surgical History:  Procedure Laterality Date  . BACK SURGERY    . CHOLECYSTECTOMY    .  COLONOSCOPY N/A 03/13/2016   Procedure: COLONOSCOPY;  Surgeon: Rogene Houston, MD;  [facility];  Service: Endoscopy;  Laterality: N/A;  1255  . CYSTOSCOPY WITH URETHRAL DILATATION N/A 04/20/2014   Procedure: CYSTOSCOPY WITH URETHRAL DILATATION AND FOLEY PLACEMENT;  Surgeon: Gearlean Alf, MD;  Location: [facility];  Service: Orthopedics;  Laterality: N/A;  . EYE SURGERY     CATARACT BOTH EYES 2014  . Prostate radiation seed implantation    . PROSTATE SURGERY     with seed implants  . TOTAL HIP ARTHROPLASTY Right 04/20/2014   Procedure: RIGHT TOTAL HIP ARTHROPLASTY ANTERIOR APPROACH;  Surgeon: Gearlean Alf, MD;  Location: [facility];  Service: Orthopedics;  Laterality: Right;  . TOTAL HIP ARTHROPLASTY Left 08/14/2015   Procedure: LEFT TOTAL HIP ARTHROPLASTY ANTERIOR APPROACH;  Surgeon: Gaynelle Arabian, MD;  Location: [facility];  Service: Orthopedics;  Laterality: Left;  . TRANSURETHRAL RESECTION OF PROSTATE  2012  . VASECTOMY    . VASECTOMY         Home Medications    Prior to Admission medications   Medication Sig Start Date End Date Taking? Authorizing Provider  apixaban (ELIQUIS) 2.5 MG TABS tablet Take 2.5 mg by mouth 2 (two) times daily.   Yes Historical Provider, MD  fenofibrate 160 MG tablet Take 1 tablet by mouth daily. 01/14/16  Yes Historical Provider, MD  glimepiride (AMARYL) 2 MG  tablet Take 2 mg by mouth daily before breakfast.   Yes Historical Provider, MD  insulin detemir (LEVEMIR) 100 UNIT/ML injection Inject 26 Units into the skin at bedtime.   Yes Historical Provider, MD  insulin lispro (HUMALOG) 100 UNIT/ML injection Inject 0-8 Units into the skin 3 (three) times daily before meals. Based on sliding scale   Yes Historical Provider, MD  pioglitazone (ACTOS) 30 MG tablet Take 30 mg by mouth at bedtime.    Yes Historical Provider, MD  ciprofloxacin (CIPRO) 500 MG tablet Take 1 tablet (500 mg total) by mouth every 12 (twelve) hours. 08/07/16   Fredia Sorrow, MD    metroNIDAZOLE (FLAGYL) 500 MG tablet Take 1 tablet (500 mg total) by mouth 2 (two) times daily. 08/07/16   Fredia Sorrow, MD  NON FORMULARY C-Pap.    Historical Provider, MD    Family History Family History  Problem Relation Age of Onset  . Asthma Mother   . Diabetes Mother   . Heart attack Father   . COPD Brother     Social History Social History  Substance Use Topics  . Smoking status: Former Smoker    Packs/day: 0.10    Years: 29.00    Types: Cigarettes    Quit date: 12/28/1978  . Smokeless tobacco: Never Used  . Alcohol use No     Allergies   Hydrocodone   Review of Systems Review of Systems  Constitutional: Negative for fever.  HENT: Negative for congestion.   Eyes: Negative for visual disturbance.  Respiratory: Negative for shortness of breath.   Cardiovascular: Negative for chest pain.  Gastrointestinal: Negative for abdominal pain, blood in stool, nausea and vomiting.  Genitourinary: Negative for dysuria.  Musculoskeletal: Negative for back pain.  Skin: Positive for wound.  Allergic/Immunologic: Positive for immunocompromised state.  Neurological: Negative for headaches.  Hematological: Does not bruise/bleed easily.  Psychiatric/Behavioral: Negative for confusion.     Physical Exam Updated Vital Signs BP 159/66 (BP Location: Right Arm)   Pulse (!) 56   Temp 99.6 F (37.6 C) (Oral)   Resp 12   Ht 6\' 1"  (1.854 m)   Wt 94.8 kg   SpO2 97%   BMI 27.57 kg/m   Physical Exam  Constitutional: He is oriented to person, place, and time. He appears well-developed and well-nourished. No distress.  HENT:  Head: Normocephalic and atraumatic.  Eyes: EOM are normal. Pupils are equal, round, and reactive to light.  Neck: Normal range of motion. Neck supple.  Cardiovascular: Normal rate, regular rhythm and normal heart sounds.   Pulmonary/Chest: Effort normal and breath sounds normal.  Abdominal: Soft. Bowel sounds are normal. There is no tenderness.   Genitourinary:  Genitourinary Comments: Perianal area with the approximately 4 areas of induration and erythema each one measuring about 3 x 4 cm. Each has a scab on top no evidence of any fluctuance. No significant tenderness. Not involving the scrotum. Isolated small scab with the small area of induration 1 cm up on the left anterior part of the groin.  Musculoskeletal: Normal range of motion.  Neurological: He is alert and oriented to person, place, and time. No cranial nerve deficit. He exhibits normal muscle tone. Coordination normal.  Skin: Skin is warm. There is erythema.  Nursing note and vitals reviewed.    ED Treatments / Results  Labs (all labs ordered are listed, but only abnormal results are displayed) Labs Reviewed  CBC WITH DIFFERENTIAL/PLATELET - Abnormal; Notable for the following:  Result Value   RBC 3.72 (*)    Hemoglobin 11.3 (*)    HCT 34.4 (*)    All other components within normal limits  BASIC METABOLIC PANEL - Abnormal; Notable for the following:    Glucose, Bld 201 (*)    BUN 25 (*)    Creatinine, Ser 1.56 (*)    Calcium 8.1 (*)    GFR calc non Af Amer 41 (*)    GFR calc Af Amer 47 (*)    Anion gap 4 (*)    All other components within normal limits  MRSA PCR SCREENING   Results for orders placed or performed during the hospital encounter of 08/07/16  MRSA PCR Screening  Result Value Ref Range   MRSA by PCR NEGATIVE NEGATIVE  CBC with Differential  Result Value Ref Range   WBC 7.1 4.0 - 10.5 K/uL   RBC 3.72 (L) 4.22 - 5.81 MIL/uL   Hemoglobin 11.3 (L) 13.0 - 17.0 g/dL   HCT 34.4 (L) 39.0 - 52.0 %   MCV 92.5 78.0 - 100.0 fL   MCH 30.4 26.0 - 34.0 pg   MCHC 32.8 30.0 - 36.0 g/dL   RDW 13.9 11.5 - 15.5 %   Platelets 185 150 - 400 K/uL   Neutrophils Relative % 73 %   Neutro Abs 5.1 1.7 - 7.7 K/uL   Lymphocytes Relative 14 %   Lymphs Abs 1.0 0.7 - 4.0 K/uL   Monocytes Relative 10 %   Monocytes Absolute 0.7 0.1 - 1.0 K/uL   Eosinophils  Relative 3 %   Eosinophils Absolute 0.2 0.0 - 0.7 K/uL   Basophils Relative 0 %   Basophils Absolute 0.0 0.0 - 0.1 K/uL  Basic metabolic panel  Result Value Ref Range   Sodium 136 135 - 145 mmol/L   Potassium 4.3 3.5 - 5.1 mmol/L   Chloride 107 101 - 111 mmol/L   CO2 25 22 - 32 mmol/L   Glucose, Bld 201 (H) 65 - 99 mg/dL   BUN 25 (H) 6 - 20 mg/dL   Creatinine, Ser 1.56 (H) 0.61 - 1.24 mg/dL   Calcium 8.1 (L) 8.9 - 10.3 mg/dL   GFR calc non Af Amer 41 (L) >60 mL/min   GFR calc Af Amer 47 (L) >60 mL/min   Anion gap 4 (L) 5 - 15     EKG  EKG Interpretation None       Radiology Ct Abdomen Pelvis W Contrast  Result Date: 08/07/2016 CLINICAL DATA:  Pt sent by Dr. Willey Blade for abscesses on both sides of buttocks and small abscess to left groin area. Pt is going to be seen by Dr. Arnoldo Morale to see if any surgical intervention is needed EXAM: CT ABDOMEN AND PELVIS WITH CONTRAST TECHNIQUE: Multidetector CT imaging of the abdomen and pelvis was performed using the standard protocol following bolus administration of intravenous contrast. CONTRAST:  170mL ISOVUE-300 IOPAMIDOL (ISOVUE-300) INJECTION 61% COMPARISON:  None. FINDINGS: Lower chest: Calcified pleural plaques are identified at the lung bases. Heart size is normal. Coronary artery calcifications are present. No pericardial effusions. Hepatobiliary: Liver is homogeneous. The gallbladder is surgically absent. Pancreas: Normal appearance. Spleen: Normal appearance. Renal/Adrenal: Adrenal glands are normal in appearance. Left renal cyst is identified in the midpole region. Small bilateral parapelvic cysts are also present. No hydronephrosis. Gastrointestinal tract: The stomach and small bowel loops are normal in appearance. The appendix is well seen and has a normal appearance. There is extensive colonic diverticulosis. No acute diverticulitis.  Reproductive/Pelvis: There is extensive streak artifact from bilateral hip arthroplasty. Patient has had  radiation therapy of the prostate. No free pelvic fluid. No pelvic adenopathy. Small inguinal lymph nodes are identified bilaterally, none suspicious. Although there is significant streak artifact in the inguinal regions, and no soft tissue mass or fluid collection identified to suggest abscess in the inguinal regions. Vascular/Lymphatic: Extensive atherosclerotic calcification of the abdominal aorta. No aneurysm. No retroperitoneal or mesenteric adenopathy. Musculoskeletal/Abdominal wall: There is indurated appearance of the subcutaneous fat along the medial aspect of the posterior thighs, left greater than right. No discrete fluid collection is identified. No ulceration identified. Other: none IMPRESSION: 1. Injury appearance of the posterior gluteal fat, extending into the medial posterior thighs bilaterally, left greater than right. No CT evidence for fluid collection. 2. Calcified pleural plaques at the lung bases para 3. Coronary artery disease. 4. Status post cholecystectomy. 5. Bilateral renal cysts. 6. Extensive colonic diverticulosis without acute diverticulitis. 7. No evidence for left inguinal abscess. Small bilateral inguinal lymph nodes are likely reactive. 8. Significant artifact from bilateral hip arthroplasty. Electronically Signed   By: Nolon Nations M.D.   On: 08/07/2016 20:10    Procedures Procedures (including critical care time)  Medications Ordered in ED Medications  0.9 %  sodium chloride infusion (not administered)  diatrizoate meglumine-sodium (GASTROGRAFIN) 66-10 % solution (not administered)  ciprofloxacin (CIPRO) IVPB 400 mg (400 mg Intravenous New Bag/Given 08/07/16 2114)  metroNIDAZOLE (FLAGYL) IVPB 500 mg (not administered)  sodium chloride 0.9 % bolus 500 mL (0 mLs Intravenous Stopped 08/07/16 1732)  iopamidol (ISOVUE-300) 61 % injection 100 mL (100 mLs Intravenous Contrast Given 08/07/16 1853)     Initial Impression / Assessment and Plan / ED Course  I have  reviewed the triage vital signs and the nursing notes.  Pertinent labs & imaging results that were available during my care of the patient were reviewed by me and considered in my medical decision making (see chart for details).  Clinical Course   The patient with several areas of induration and erythema and presumed superficial abscesses in the periwound anal buttocks area. CT scan shows no evidence of any perirectal abscess or any deep space abscesses. No involvement of the scrotum at this time.  Patient seen by general surgery Dr. Arnoldo Morale. Recommended if the MRSA testing was negative and the CT scan showed no deep space infections to treat him with Cipro and Flagyl. He would follow him up in the office on Monday. Patient is a diabetic. Patient nontoxic no acute distress at this time. Patient will receive 1 dose of IV Flagyl and Cipro here. Patient will return over the weekend for any new or worse symptoms.   Final Clinical Impressions(s) / ED Diagnoses   Final diagnoses:  Abscess and cellulitis    New Prescriptions New Prescriptions   CIPROFLOXACIN (CIPRO) 500 MG TABLET    Take 1 tablet (500 mg total) by mouth every 12 (twelve) hours.   METRONIDAZOLE (FLAGYL) 500 MG TABLET    Take 1 tablet (500 mg total) by mouth 2 (two) times daily.     Fredia Sorrow, MD 08/07/16 2131

## 2016-08-11 ENCOUNTER — Ambulatory Visit (HOSPITAL_COMMUNITY): Payer: Medicare Other

## 2016-08-11 DIAGNOSIS — M6281 Muscle weakness (generalized): Secondary | ICD-10-CM | POA: Diagnosis not present

## 2016-08-11 DIAGNOSIS — R2689 Other abnormalities of gait and mobility: Secondary | ICD-10-CM

## 2016-08-11 DIAGNOSIS — R29898 Other symptoms and signs involving the musculoskeletal system: Secondary | ICD-10-CM

## 2016-08-11 DIAGNOSIS — R262 Difficulty in walking, not elsewhere classified: Secondary | ICD-10-CM

## 2016-08-11 DIAGNOSIS — R2681 Unsteadiness on feet: Secondary | ICD-10-CM

## 2016-08-11 NOTE — Therapy (Addendum)
[facility] [address] [place], [place], [address] Phone: [phone]   Fax:  [phone]  Physical Therapy Treatment  Patient Details  Name: Manuel [name] A Spranger MRN: [id number] Date of Birth: Aug 01, 1935 Referring Provider: Trey SailorsMark Roy   Encounter Date: 08/11/2016      PT End of Session - 08/11/16 1615    Visit Number 3   Number of Visits 16   Date for PT Re-Evaluation 09/01/16   Authorization Type [employer] Medicare    Authorization Time Period 08/04/16 to 10/04/16   Authorization - Visit Number 3   Authorization - Number of Visits 10   PT Start Time 1605   PT Stop Time 1650   PT Time Calculation (min) 45 min   Activity Tolerance Patient tolerated treatment well   Behavior During Therapy [facility] for tasks assessed/performed      Past Medical History:  Diagnosis Date  . Arthritis   . Assistance needed for mobility    walker  . Back pain   . Cancer (HCC)    PROSTATE -- RADIATION SEED IMPLANT  . Diabetes mellitus    DX SINCE 1998  . Heart murmur   . Hypertension   . Poor balance   . Sleep apnea     Past Surgical History:  Procedure Laterality Date  . BACK SURGERY    . CHOLECYSTECTOMY    . COLONOSCOPY N/A 03/13/2016   Procedure: COLONOSCOPY;  Surgeon: Malissa HippoNajeeb U Rehman, MD;  [facility];  Service: Endoscopy;  Laterality: N/A;  1255  . CYSTOSCOPY WITH URETHRAL DILATATION N/A 04/20/2014   Procedure: CYSTOSCOPY WITH URETHRAL DILATATION AND FOLEY PLACEMENT;  Surgeon: Loanne DrillingFrank V Aluisio, MD;  Location: [facility];  Service: Orthopedics;  Laterality: N/A;  . EYE SURGERY     CATARACT BOTH EYES 2014  . Prostate radiation seed implantation    . PROSTATE SURGERY     with seed implants  . TOTAL HIP ARTHROPLASTY Right 04/20/2014   Procedure: RIGHT TOTAL HIP ARTHROPLASTY ANTERIOR APPROACH;  Surgeon: Loanne DrillingFrank V Aluisio, MD;  Location: [facility];  Service: Orthopedics;  Laterality: Right;  . TOTAL HIP ARTHROPLASTY Left 08/14/2015   Procedure: LEFT TOTAL HIP  ARTHROPLASTY ANTERIOR APPROACH;  Surgeon: Ollen GrossFrank Aluisio, MD;  [facility];  Service: Orthopedics;  Laterality: Left;  . TRANSURETHRAL RESECTION OF PROSTATE  2012  . VASECTOMY    . VASECTOMY      There were no vitals filed for this visit.      Subjective Assessment - 08/11/16 1609    Subjective Pt reports he spent 11 hours in ER on Friday about 4 knots on Lt hip and Bil buttocks, pain scale 10/10, increased pain in seated position.  Reports compliance with HEP daily.   Pertinent History HTN, DM, OSA, BPH, hx of L3-L4 surgery, eye problems with blurred vision, B anterior hips    Patient Stated Goals get strength back, get walking better and potentially get rid of device    Currently in Pain? Yes   Pain Score 10-Worst pain ever   Pain Location Buttocks   Pain Orientation Right;Left   Pain Descriptors / Indicators Discomfort   Pain Type Acute pain   Pain Onset In the past 7 days   Pain Frequency Constant  increase in seated position   Aggravating Factors  sitting   Pain Relieving Factors laying down   Effect of Pain on Daily Activities decrease             [facility]  Adult PT Treatment/Exercise - 08/11/16 0001      Knee/Hip Exercises: Standing   Knee Flexion 10 reps;Both   Forward Lunges Both;15 reps   Forward Lunges Limitations 6 in step wtih TKE    Other Standing Knee Exercises Standing marching inside RW 10x 5"     Knee/Hip Exercises: Supine   Quad Sets Right;2 sets;10 reps   Quad Sets Limitations tactile cueing   Short Arc Quad Sets 10 reps;AAROM   Short Arc Quad Sets Limitations AAROM with small bolster   Heel Slides 15 reps   Bridges 2 sets;10 reps   Other Supine Knee/Hip Exercises supine abduction AAROM Rt LE cueing to reduce ER     Knee/Hip Exercises: Sidelying   Clams 10x each                  PT Short Term Goals - 08/04/16 1756      PT SHORT TERM GOAL #1   Title Patient to be able to ambulate 444ft in 3MWT with LRAD in order to demonstrate  improved mobility and function    Time 4   Period Weeks   Status New     PT SHORT TERM GOAL #2   Title Patient to maintain correct posture at least 75% of the time in order to maintain low levels of pain and improve balance    Time 4   Period Weeks   Status New     PT SHORT TERM GOAL #3   Title Patient to demonstrate ability to complete TUG in 15 seconds or less with LRAD in order to show improved balance/reduced fall risk    Time 4   Period Weeks   Status New     PT SHORT TERM GOAL #4   Title Patient to be able to correctly and consistently perform appropriate HEP, to be updated PRN    Time 4   Period Weeks   Status New           PT Long Term Goals - 08/04/16 1758      PT LONG TERM GOAL #1   Title Patient to score at least 4-/5 in all tested muscle groups in R LE and 5/5 in all other tested muscles in order to improve mobilty and gait    Time 8   Period Weeks   Status New     PT LONG TERM GOAL #2   Title Patient to demonstrate correct functional mechanics for all functional tasks, including bed mobility and functional lifting, in order to protect lumbar area and reduce fall risk    Time 8   Period Weeks   Status New     PT LONG TERM GOAL #3   Title Patient to be able to ambulate at least 671ft during 3MWT with LRAD in order to show improved overall mobility    Time 8   Period Weeks   Status New     PT LONG TERM GOAL #4   Title Patient to be able to maintain SLS B LEs at least 30 seconds in order to show improved balance skills and reduced fall risk    Time 8   Period Weeks   Status New     PT LONG TERM GOAL #5   Title Patient to be participatory in regular exercise program, at least 20 minutes in duration, at least 4 days per week, in order to maintain functional gains and assist in improving overall health status    Time 8  Period Weeks   Status New               Plan - 08/11/16 1633    Clinical Impression Statement Pt limited by pain in Bil  buttocks with reports of 4 knots, has been to ER and apts with surgeon.  Session foucs on improving LE strengthening primarly Rt LE quad and gluteal muscuature.  Main there on OKC or CKC, no seated exercises today due to pain.  Pt continues to display very weak musculature with AAROM required with some distal quad strengthening exercises.  Added lunges on step with TKE following lunges for quad strengthening.     Rehab Potential Good   PT Frequency 2x / week   PT Duration 8 weeks   PT Treatment/Interventions ADLs/Self Care Home Management;Cryotherapy;Moist Heat;Electrical Stimulation;Gait training;DME Instruction;Stair training;Functional mobility training;Therapeutic activities;Therapeutic exercise;Balance training;Neuromuscular re-education;Patient/family education;Manual techniques;Passive range of motion;Energy conservation;Taping   PT Next Visit Plan Next session begin estim with AAROM SAQ.  focus on functional strength especially for R LE, gait and balance, patient specifically requests Nustep as much as possible       Patient will benefit from skilled therapeutic intervention in order to improve the following deficits and impairments:  Abnormal gait, Improper body mechanics, Decreased coordination, Decreased mobility, Postural dysfunction, Decreased activity tolerance, Decreased strength, Decreased balance, Difficulty walking, Decreased endurance, Cardiopulmonary status limiting activity  Visit Diagnosis: Muscle weakness (generalized)  Unsteadiness on feet  Difficulty in walking, not elsewhere classified  Other symptoms and signs involving the musculoskeletal system  Poor balance  Leg weakness, bilateral     Problem List Patient Active Problem List   Diagnosis Date Noted  . Morbid obesity ([place]) 07/30/2015  . OSA (obstructive sleep apnea) 01/22/2015  . OA (osteoarthritis) of hip 04/20/2014  . Bladder neck contracture 04/20/2014  . Lumbago 07/05/2012  . Spondylosis with  myelopathy, lumbar region 07/05/2012  . Degenerative arthritis of hip 09/22/2011  . Degenerative disc disease, lumbar 09/22/2011  . Acute on chronic renal failure ([place]) 08/07/2011  . BPH (benign prostatic hyperplasia) 08/07/2011  . Prostate cancer ([place]) 08/07/2011  . Hypertension 08/07/2011  . Diabetes mellitus type 2 with complications ([place]) 123456  . Lumbar disc disease 08/07/2011   Ihor Austin, [place]; [address]  Aldona Lento 08/11/2016, 5:34 PM  [place], [place], [address] Phone: [phone]   Fax:  [phone]  Name: CARDELL TAE MRN: [id number] Date of Birth: February 12, 1935

## 2016-08-13 ENCOUNTER — Ambulatory Visit (HOSPITAL_COMMUNITY): Payer: Medicare Other

## 2016-08-13 DIAGNOSIS — R2681 Unsteadiness on feet: Secondary | ICD-10-CM

## 2016-08-13 DIAGNOSIS — R262 Difficulty in walking, not elsewhere classified: Secondary | ICD-10-CM

## 2016-08-13 DIAGNOSIS — R29898 Other symptoms and signs involving the musculoskeletal system: Secondary | ICD-10-CM

## 2016-08-13 DIAGNOSIS — M6281 Muscle weakness (generalized): Secondary | ICD-10-CM | POA: Diagnosis not present

## 2016-08-13 DIAGNOSIS — R2689 Other abnormalities of gait and mobility: Secondary | ICD-10-CM

## 2016-08-13 NOTE — Therapy (Signed)
[facility] [address] [place], [place], [address] Phone: [phone]   Fax:  [phone]  Physical Therapy Treatment  Patient Details  Name: Manuel [name] MRN: [id number] Date of Birth: [birth year] Referring Provider: Glenna Fellows   Encounter Date: 08/13/2016      PT End of Session - 08/13/16 1629    Visit Number 4   Number of Visits 16   Date for PT Re-Evaluation 09/01/16   Authorization Type [employer] Medicare    Authorization Time Period 08/04/16 to 10/04/16   Authorization - Visit Number 4   Authorization - Number of Visits 10   PT Start Time 1608   PT Stop Time 1653   PT Time Calculation (min) 45 min   Equipment Utilized During Treatment Gait belt   Activity Tolerance Patient tolerated treatment well   Behavior During Therapy [facility] for tasks assessed/performed      Past Medical History:  Diagnosis Date  . Arthritis   . Assistance needed for mobility    walker  . Back pain   . Cancer (HCC)    PROSTATE -- RADIATION SEED IMPLANT  . Diabetes mellitus    DX SINCE 1998  . Heart murmur   . Hypertension   . Poor balance   . Sleep apnea     Past Surgical History:  Procedure Laterality Date  . BACK SURGERY    . CHOLECYSTECTOMY    . COLONOSCOPY N/A 03/13/2016   Procedure: COLONOSCOPY;  Surgeon: Rogene Houston, MD;  [facility];  Service: Endoscopy;  Laterality: N/A;  1255  . CYSTOSCOPY WITH URETHRAL DILATATION N/A 04/20/2014   Procedure: CYSTOSCOPY WITH URETHRAL DILATATION AND FOLEY PLACEMENT;  Surgeon: Gearlean Alf, MD;  Location: [place];  Service: Orthopedics;  Laterality: N/A;  . EYE SURGERY     CATARACT BOTH EYES 2014  . Prostate radiation seed implantation    . PROSTATE SURGERY     with seed implants  . TOTAL HIP ARTHROPLASTY Right 04/20/2014   Procedure: RIGHT TOTAL HIP ARTHROPLASTY ANTERIOR APPROACH;  Surgeon: Gearlean Alf, MD;  Location: [place];  Service: Orthopedics;  Laterality: Right;  . TOTAL HIP  ARTHROPLASTY Left 08/14/2015   Procedure: LEFT TOTAL HIP ARTHROPLASTY ANTERIOR APPROACH;  Surgeon: Gaynelle Arabian, MD;  [facility];  Service: Orthopedics;  Laterality: Left;  . TRANSURETHRAL RESECTION OF PROSTATE  2012  . VASECTOMY    . VASECTOMY      There were no vitals filed for this visit.      Subjective Assessment - 08/13/16 1625    Subjective Pt stated he continues to have pain Bil buttocks on the knots, pain scale 10/10.  Reports MD stated to continue the medication that it will take some time to resolve.     Pertinent History HTN, DM, OSA, BPH, hx of L3-L4 surgery, eye problems with blurred vision, B anterior hips, hx of prostate cancer 10 years ago   Patient Stated Goals get strength back, get walking better and potentially get rid of device    Currently in Pain? Yes   Pain Score 10-Worst pain ever   Pain Location Buttocks   Pain Orientation Right;Left   Pain Descriptors / Indicators Discomfort   Pain Type Acute pain   Pain Onset In the past 7 days   Pain Frequency Constant   Aggravating Factors  sitting   Pain Relieving Factors laying down   Effect of Pain on Daily Activities decrease ADLs  [facility] Adult PT Treatment/Exercise - 08/13/16 0001      Knee/Hip Exercises: Standing   Heel Raises Both;10 reps   Heel Raises Limitations toe raises- partially.  begin seated next session   Forward Lunges Both;15 reps   Forward Lunges Limitations 6 in step wtih TKE tactile cueing on distal quad      Knee/Hip Exercises: Supine   Quad Sets Right;2 sets;10 reps   Quad Sets Limitations tactile cueing    Short Arc Quad Sets 10 reps;AAROM   Short Arc Quad Sets Limitations AAROM with small bolster   Heel Slides 15 reps   Bridges 2 sets;10 reps   Other Supine Knee/Hip Exercises supine bent knee raise 10x 5" holds   Other Supine Knee/Hip Exercises supine abduction AAROM Rt LE cueing to reduce ER            PT Short Term Goals - 08/04/16 1756      PT SHORT  TERM GOAL #1   Title Patient to be able to ambulate 464ft in 3MWT with LRAD in order to demonstrate improved mobility and function    Time 4   Period Weeks   Status New     PT SHORT TERM GOAL #2   Title Patient to maintain correct posture at least 75% of the time in order to maintain low levels of pain and improve balance    Time 4   Period Weeks   Status New     PT SHORT TERM GOAL #3   Title Patient to demonstrate ability to complete TUG in 15 seconds or less with LRAD in order to show improved balance/reduced fall risk    Time 4   Period Weeks   Status New     PT SHORT TERM GOAL #4   Title Patient to be able to correctly and consistently perform appropriate HEP, to be updated PRN    Time 4   Period Weeks   Status New           PT Long Term Goals - 08/04/16 1758      PT LONG TERM GOAL #1   Title Patient to score at least 4-/5 in all tested muscle groups in R LE and 5/5 in all other tested muscles in order to improve mobilty and gait    Time 8   Period Weeks   Status New     PT LONG TERM GOAL #2   Title Patient to demonstrate correct functional mechanics for all functional tasks, including bed mobility and functional lifting, in order to protect lumbar area and reduce fall risk    Time 8   Period Weeks   Status New     PT LONG TERM GOAL #3   Title Patient to be able to ambulate at least 662ft during 3MWT with LRAD in order to show improved overall mobility    Time 8   Period Weeks   Status New     PT LONG TERM GOAL #4   Title Patient to be able to maintain SLS B LEs at least 30 seconds in order to show improved balance skills and reduced fall risk    Time 8   Period Weeks   Status New     PT LONG TERM GOAL #5   Title Patient to be participatory in regular exercise program, at least 20 minutes in duration, at least 4 days per week, in order to maintain functional gains and assist in improving overall health status    Time  Green - 08/13/16 1713    Clinical Impression Statement Reviewed contra-indications prior trial with estim for neuro re-ed on quadriceps for strengtheing.  Pt reports hx of prostate cancer 10 years ago and reports PSA 0 last tested.  Spoke with evaluation therapist and chose to hold this session, may begin in the future following further research.  Session focus improve LE strengthening primarly Rt LE quad and gluteal muscualture.  Pt is improving distal quadriceps strengthening noted by palpation with therex, continues to demonstre extreme weakness with inability to complete SAQ.  SAQ were complete this session with AAROM and verbal cueing to improve muscle activaiton.  Pt continues to c/o Bil buttocks pain, minimal activities complete this session in seated position.     Rehab Potential Good   PT Frequency 2x / week   PT Duration 8 weeks   PT Treatment/Interventions ADLs/Self Care Home Management;Cryotherapy;Moist Heat;Electrical Stimulation;Gait training;DME Instruction;Stair training;Functional mobility training;Therapeutic activities;Therapeutic exercise;Balance training;Neuromuscular re-education;Patient/family education;Manual techniques;Passive range of motion;Energy conservation;Taping   PT Next Visit Plan F/U with evaluation therapist with estim for rt quadriceps.  Continue focus on functional strength especially for R LE, gait and balance, patient specifically requests Nustep as much as possible       Patient will benefit from skilled therapeutic intervention in order to improve the following deficits and impairments:  Abnormal gait, Improper body mechanics, Decreased coordination, Decreased mobility, Postural dysfunction, Decreased activity tolerance, Decreased strength, Decreased balance, Difficulty walking, Decreased endurance, Cardiopulmonary status limiting activity  Visit Diagnosis: Muscle weakness (generalized)  Unsteadiness on feet  Difficulty in walking, not  elsewhere classified  Other symptoms and signs involving the musculoskeletal system  Poor balance  Leg weakness, bilateral     Problem List Patient Active Problem List   Diagnosis Date Noted  . Morbid obesity ([place]) 07/30/2015  . OSA (obstructive sleep apnea) 01/22/2015  . OA (osteoarthritis) of hip 04/20/2014  . Bladder neck contracture 04/20/2014  . Lumbago 07/05/2012  . Spondylosis with myelopathy, lumbar region 07/05/2012  . Degenerative arthritis of hip 09/22/2011  . Degenerative disc disease, lumbar 09/22/2011  . Acute on chronic renal failure ([place]) 08/07/2011  . BPH (benign prostatic hyperplasia) 08/07/2011  . Prostate cancer ([place]) 08/07/2011  . Hypertension 08/07/2011  . Diabetes mellitus type 2 with complications ([place]) 123456  . Lumbar disc disease 08/07/2011   Ihor [place], [place]; [place]  Aldona Lento 08/13/2016, 5:20 PM  [place], [place], [address] Phone: [phone]   Fax:  [phone]  Name: Manuel [name] MRN: [id number] Date of Birth: 1935-12-21

## 2016-08-17 ENCOUNTER — Ambulatory Visit (HOSPITAL_COMMUNITY): Payer: Medicare Other | Admitting: Physical Therapy

## 2016-08-17 DIAGNOSIS — R262 Difficulty in walking, not elsewhere classified: Secondary | ICD-10-CM

## 2016-08-17 DIAGNOSIS — M6281 Muscle weakness (generalized): Secondary | ICD-10-CM

## 2016-08-17 DIAGNOSIS — R2681 Unsteadiness on feet: Secondary | ICD-10-CM

## 2016-08-17 DIAGNOSIS — R29898 Other symptoms and signs involving the musculoskeletal system: Secondary | ICD-10-CM

## 2016-08-17 NOTE — Therapy (Signed)
[facility] [address] [place], [place], [address] Phone: [phone]   Fax:  [phone]  Physical Therapy Treatment  Patient Details  Name: Manuel [name] MRN: [id number] Date of Birth: [birth year] Referring Provider: Glenna Fellows   Encounter Date: 08/17/2016      PT End of Session - 08/17/16 1222    Visit Number 5   Number of Visits 16   Date for PT Re-Evaluation 09/01/16   Authorization Type [employer] Medicare    Authorization Time Period 08/04/16 to 10/04/16   Authorization - Visit Number 5   Authorization - Number of Visits 10   PT Start Time 1033   PT Stop Time 1112   PT Time Calculation (min) 39 min   Activity Tolerance Patient tolerated treatment well   Behavior During Therapy [facility] for tasks assessed/performed      Past Medical History:  Diagnosis Date  . Arthritis   . Assistance needed for mobility    walker  . Back pain   . Cancer (HCC)    PROSTATE -- RADIATION SEED IMPLANT  . Diabetes mellitus    DX SINCE 1998  . Heart murmur   . Hypertension   . Poor balance   . Sleep apnea     Past Surgical History:  Procedure Laterality Date  . BACK SURGERY    . CHOLECYSTECTOMY    . COLONOSCOPY N/A 03/13/2016   Procedure: COLONOSCOPY;  Surgeon: Rogene Houston, MD;  [facility];  Service: Endoscopy;  Laterality: N/A;  1255  . CYSTOSCOPY WITH URETHRAL DILATATION N/A 04/20/2014   Procedure: CYSTOSCOPY WITH URETHRAL DILATATION AND FOLEY PLACEMENT;  Surgeon: Gearlean Alf, MD;  Location: [place];  Service: Orthopedics;  Laterality: N/A;  . EYE SURGERY     CATARACT BOTH EYES 2014  . Prostate radiation seed implantation    . PROSTATE SURGERY     with seed implants  . TOTAL HIP ARTHROPLASTY Right 04/20/2014   Procedure: RIGHT TOTAL HIP ARTHROPLASTY ANTERIOR APPROACH;  Surgeon: Gearlean Alf, MD;  Location: [place];  Service: Orthopedics;  Laterality: Right;  . TOTAL HIP ARTHROPLASTY Left 08/14/2015   Procedure: LEFT TOTAL HIP  ARTHROPLASTY ANTERIOR APPROACH;  Surgeon: Gaynelle Arabian, MD;  [facility];  Service: Orthopedics;  Laterality: Left;  . TRANSURETHRAL RESECTION OF PROSTATE  2012  . VASECTOMY    . VASECTOMY      There were no vitals filed for this visit.      Subjective Assessment - 08/17/16 1036    Subjective Patient arrives stating that he is having B buttock pain on the knots/boils on his rear, otherwsie doing well. He states that his PSA has been 0 for around 10 years but that he only had radiation therapy, he did not have his prostate removed. No falls or close calls recently.    Pertinent History HTN, DM, OSA, BPH, hx of L3-L4 surgery, eye problems with blurred vision, B anterior hips, hx of prostate cancer 10 years ago   Currently in Pain? Yes   Pain Score 6    Pain Location Buttocks   Pain Orientation Right;Left   Pain Descriptors / Indicators Discomfort                         [facility] Adult PT Treatment/Exercise - 08/17/16 0001      Knee/Hip Exercises: Standing   Heel Raises Both;1 set;20 reps   Heel Raises Limitations heel and toe  Forward Step Up 1 set;Right;15 reps   Forward Step Up Limitations 2 inch box      Knee/Hip Exercises: Seated   Sit to Sand 5 reps;with UE support;Other (comment)  2 sets, R LE staggered back      Knee/Hip Exercises: Supine   Quad Sets Right;2 sets;15 reps   Quad Sets Limitations tactile cues    Short Arc [employer] Right;15 reps   Short Arc Quad Sets Limitations min-mod assist    Heel Slides 15 reps   Bridges 2 sets;15 reps   Other Supine Knee/Hip Exercises supine hip ABD red TB 1x10     Knee/Hip Exercises: Sidelying   Clams 15x each                 PT Education - 08/17/16 1222    Education provided No          PT Short Term Goals - 08/04/16 1756      PT SHORT TERM GOAL #1   Title Patient to be able to ambulate 421ft in 3MWT with LRAD in order to demonstrate improved mobility and function    Time 4   Period  Weeks   Status New     PT SHORT TERM GOAL #2   Title Patient to maintain correct posture at least 75% of the time in order to maintain low levels of pain and improve balance    Time 4   Period Weeks   Status New     PT SHORT TERM GOAL #3   Title Patient to demonstrate ability to complete TUG in 15 seconds or less with LRAD in order to show improved balance/reduced fall risk    Time 4   Period Weeks   Status New     PT SHORT TERM GOAL #4   Title Patient to be able to correctly and consistently perform appropriate HEP, to be updated PRN    Time 4   Period Weeks   Status New           PT Long Term Goals - 08/04/16 1758      PT LONG TERM GOAL #1   Title Patient to score at least 4-/5 in all tested muscle groups in R LE and 5/5 in all other tested muscles in order to improve mobilty and gait    Time 8   Period Weeks   Status New     PT LONG TERM GOAL #2   Title Patient to demonstrate correct functional mechanics for all functional tasks, including bed mobility and functional lifting, in order to protect lumbar area and reduce fall risk    Time 8   Period Weeks   Status New     PT LONG TERM GOAL #3   Title Patient to be able to ambulate at least 652ft during 3MWT with LRAD in order to show improved overall mobility    Time 8   Period Weeks   Status New     PT LONG TERM GOAL #4   Title Patient to be able to maintain SLS B LEs at least 30 seconds in order to show improved balance skills and reduced fall risk    Time 8   Period Weeks   Status New     PT LONG TERM GOAL #5   Title Patient to be participatory in regular exercise program, at least 20 minutes in duration, at least 4 days per week, in order to maintain functional gains and assist in improving overall health  status    Time 8   Period Weeks   Status New               Plan - 08/17/16 1223    Clinical Impression Statement Patient arrives stating that he only had radiation therapy for his prostate, he  did not have it removed and his PSA has been 0 for quite awhile; PT to perform more research, given this information, and will have answer regarding e-stim next session. Continued with functional exercises on mat table today, also performed in sitting (somewhat limited due to buttock pain) and standing positions. Did not perform Nustep today due to tenderness of boils/knots on either side of buttocks; patient reports that this is slowly getting better but remains painful. Performed step-ups in parallel bars with blocking of R knee due to patient concern of buckling/fall however only min guard provided by PT today, however note patient did have quite a bit of compensation via UE support.    Rehab Potential Good   PT Frequency 2x / week   PT Duration 8 weeks   PT Treatment/Interventions ADLs/Self Care Home Management;Cryotherapy;Moist Heat;Electrical Stimulation;Gait training;DME Instruction;Stair training;Functional mobility training;Therapeutic activities;Therapeutic exercise;Balance training;Neuromuscular re-education;Patient/family education;Manual techniques;Passive range of motion;Energy conservation;Taping   PT Next Visit Plan PT to make final determination regarding e-stim for R quad; continue focus on [facility] and [place] strength, pre-gait, balance. Continue step ups with R knee blocking.    Consulted and Agree with Plan of Care Patient      Patient will benefit from skilled therapeutic intervention in order to improve the following deficits and impairments:  Abnormal gait, Improper body mechanics, Decreased coordination, Decreased mobility, Postural dysfunction, Decreased activity tolerance, Decreased strength, Decreased balance, Difficulty walking, Decreased endurance, Cardiopulmonary status limiting activity  Visit Diagnosis: Muscle weakness (generalized)  Unsteadiness on feet  Difficulty in walking, not elsewhere classified  Other symptoms and signs involving the musculoskeletal  system     Problem List Patient Active Problem List   Diagnosis Date Noted  . Morbid obesity ([place]) 07/30/2015  . OSA (obstructive sleep apnea) 01/22/2015  . OA (osteoarthritis) of hip 04/20/2014  . Bladder neck contracture 04/20/2014  . Lumbago 07/05/2012  . Spondylosis with myelopathy, lumbar region 07/05/2012  . Degenerative arthritis of hip 09/22/2011  . Degenerative disc disease, lumbar 09/22/2011  . Acute on chronic renal failure ([place]) 08/07/2011  . BPH (benign prostatic hyperplasia) 08/07/2011  . Prostate cancer ([place]) 08/07/2011  . Hypertension 08/07/2011  . Diabetes mellitus type 2 with complications ([place]) [id number]  . Lumbar disc disease 08/07/2011    Deniece Ree PT, DPT [facility] [address] [place], [place], [address] Phone: [phone]   Fax:  [phone]  Name: Manuel [name] MRN: [id number] Date of Birth: 20-Jul-1935

## 2016-08-20 ENCOUNTER — Telehealth (HOSPITAL_COMMUNITY): Payer: Self-pay | Admitting: Physical Therapy

## 2016-08-20 ENCOUNTER — Ambulatory Visit (HOSPITAL_COMMUNITY): Payer: Medicare Other

## 2016-08-20 ENCOUNTER — Telehealth (HOSPITAL_COMMUNITY): Payer: Self-pay

## 2016-08-20 DIAGNOSIS — R262 Difficulty in walking, not elsewhere classified: Secondary | ICD-10-CM

## 2016-08-20 DIAGNOSIS — R2681 Unsteadiness on feet: Secondary | ICD-10-CM

## 2016-08-20 DIAGNOSIS — M6281 Muscle weakness (generalized): Secondary | ICD-10-CM

## 2016-08-20 DIAGNOSIS — R29898 Other symptoms and signs involving the musculoskeletal system: Secondary | ICD-10-CM

## 2016-08-20 NOTE — Telephone Encounter (Signed)
He needed to change time due to wifes MD apptment

## 2016-08-20 NOTE — Telephone Encounter (Signed)
He needed to change time due to wifes MD apptment. He will see Amy today at 4pm

## 2016-08-20 NOTE — Therapy (Signed)
[facility] [address] [place], [place], [address] Phone: [phone]   Fax:  [phone]  Physical Therapy Treatment  Patient Details  Name: Manuel [name] MRN: [id number] Date of Birth: 10/06/1935 Referring Provider: Glenna Fellows   Encounter Date: 08/20/2016      PT End of Session - 08/20/16 1629    Visit Number 6   Number of Visits 16   Date for PT Re-Evaluation 09/01/16   Authorization Type U[employer] Medicare    Authorization Time Period 08/04/16 to 10/04/16   Authorization - Visit Number 6   Authorization - Number of Visits 10   PT Start Time [id number]   PT Stop Time 1653   PT Time Calculation (min) 48 min   Equipment Utilized During Treatment Gait belt   Activity Tolerance Patient tolerated treatment well   Behavior During Therapy [facility] for tasks assessed/performed      Past Medical History:  Diagnosis Date  . Arthritis   . Assistance needed for mobility    walker  . Back pain   . Cancer (HCC)    PROSTATE -- RADIATION SEED IMPLANT  . Diabetes mellitus    DX SINCE 1998  . Heart murmur   . Hypertension   . Poor balance   . Sleep apnea     Past Surgical History:  Procedure Laterality Date  . BACK SURGERY    . CHOLECYSTECTOMY    . COLONOSCOPY N/A 03/13/2016   Procedure: COLONOSCOPY;  Surgeon: Rogene Houston, MD;  [facility];  Service: Endoscopy;  Laterality: N/A;  1255  . CYSTOSCOPY WITH URETHRAL DILATATION N/A 04/20/2014   Procedure: CYSTOSCOPY WITH URETHRAL DILATATION AND FOLEY PLACEMENT;  Surgeon: Gearlean Alf, MD;  Location: [facility];  Service: Orthopedics;  Laterality: N/A;  . EYE SURGERY     CATARACT BOTH EYES 2014  . Prostate radiation seed implantation    . PROSTATE SURGERY     with seed implants  . TOTAL HIP ARTHROPLASTY Right 04/20/2014   Procedure: RIGHT TOTAL HIP ARTHROPLASTY ANTERIOR APPROACH;  Surgeon: Gearlean Alf, MD;  Location: [facility];  Service: Orthopedics;  Laterality: Right;  . TOTAL HIP  ARTHROPLASTY Left 08/14/2015   Procedure: LEFT TOTAL HIP ARTHROPLASTY ANTERIOR APPROACH;  Surgeon: Gaynelle Arabian, MD;  [facility];  Service: Orthopedics;  Laterality: Left;  . TRANSURETHRAL RESECTION OF PROSTATE  2012  . VASECTOMY    . VASECTOMY      There were no vitals filed for this visit.      Subjective Assessment - 08/20/16 1627    Subjective Pt stated he continues to have pain B buttocks from the knots/boils, reports they are getting smaller, pain scale 6/10.  No reports of recent falls.     Pertinent History HTN, DM, OSA, BPH, hx of L3-L4 surgery, eye problems with blurred vision, B anterior hips, hx of prostate cancer 10 years ago   Patient Stated Goals get strength back, get walking better and potentially get rid of device    Currently in Pain? Yes   Pain Score 6    Pain Location Buttocks   Pain Orientation Right;Left  knots/boils   Pain Descriptors / Indicators Discomfort   Pain Type Acute pain   Pain Onset In the past 7 days   Pain Frequency Constant   Aggravating Factors  sitting   Pain Relieving Factors lay8ing supine   Effect of Pain on Daily Activities decreased ADLs  [facility] Adult PT Treatment/Exercise - 08/20/16 0001      Knee/Hip Exercises: Standing   Forward Step Up 1 set;Right;15 reps   Forward Step Up Limitations 2 inch box    Other Standing Knee Exercises Tandem stance 2x 30" intermittent HHA     Knee/Hip Exercises: Supine   Quad Sets Right;2 sets;15 reps   Quad Sets Limitations tactile cueing   Short Arc Quad Sets Limitations Complete with AAROM and estim to distal quad x 10 min   Bridges 2 sets;15 reps   Other Supine Knee/Hip Exercises supine hip ABD red TB 1x10     Modalities   Modalities Electrical Stimulation     Electrical Stimulation   Electrical Stimulation Location distal Rt quad   Electrical Stimulation Action stimulation for strengtheing   Electrical Stimulation Parameters 10/30 co-contract x 10, per pt  tolerance   Electrical Stimulation Goals Strength                  PT Short Term Goals - 08/04/16 1756      PT SHORT TERM GOAL #1   Title Patient to be able to ambulate 449ft in 3MWT with LRAD in order to demonstrate improved mobility and function    Time 4   Period Weeks   Status New     PT SHORT TERM GOAL #2   Title Patient to maintain correct posture at least 75% of the time in order to maintain low levels of pain and improve balance    Time 4   Period Weeks   Status New     PT SHORT TERM GOAL #3   Title Patient to demonstrate ability to complete TUG in 15 seconds or less with LRAD in order to show improved balance/reduced fall risk    Time 4   Period Weeks   Status New     PT SHORT TERM GOAL #4   Title Patient to be able to correctly and consistently perform appropriate HEP, to be updated PRN    Time 4   Period Weeks   Status New           PT Long Term Goals - 08/04/16 1758      PT LONG TERM GOAL #1   Title Patient to score at least 4-/5 in all tested muscle groups in R LE and 5/5 in all other tested muscles in order to improve mobilty and gait    Time 8   Period Weeks   Status New     PT LONG TERM GOAL #2   Title Patient to demonstrate correct functional mechanics for all functional tasks, including bed mobility and functional lifting, in order to protect lumbar area and reduce fall risk    Time 8   Period Weeks   Status New     PT LONG TERM GOAL #3   Title Patient to be able to ambulate at least 612ft during 3MWT with LRAD in order to show improved overall mobility    Time 8   Period Weeks   Status New     PT LONG TERM GOAL #4   Title Patient to be able to maintain SLS B LEs at least 30 seconds in order to show improved balance skills and reduced fall risk    Time 8   Period Weeks   Status New     PT LONG TERM GOAL #5   Title Patient to be participatory in regular exercise program, at least 20 minutes in duration, at least  4 days per  week, in order to maintain functional gains and assist in improving overall health status    Time 8   Period Weeks   Status New               Plan - 08/20/16 1632    Clinical Impression Statement Began session with russian estim to assist with muscle stimulation for strengthening with DPT clearance.  Noted good distal quad contraction though no active movements.  Session focus on LE strengthening with primary focus on quadricep and gluteal strengthening.  Pt able to complete therex with therapist facilitation for proper form and technqiue.  Min A and cueing for form with with sit to stand and elevated heigh (2in foam on low mat for assistance).  Minimal therex on seated position for pain control and did not complete Nustep due to the boils/knots on buttocks, pt does report getting smaller.  Step-up training complete in // bars with ability to complete 2in step with 1 HHA, trial with 4in step but unable to complete this session due to weakness.     Rehab Potential Good   PT Frequency 2x / week   PT Duration 8 weeks   PT Treatment/Interventions ADLs/Self Care Home Management;Cryotherapy;Moist Heat;Electrical Stimulation;Gait training;DME Instruction;Stair training;Functional mobility training;Therapeutic activities;Therapeutic exercise;Balance training;Neuromuscular re-education;Patient/family education;Manual techniques;Passive range of motion;Energy conservation;Taping   PT Next Visit Plan Continue with russian estim to distal quad for strengthening; continue focus on CKC and OKC strength, pre-gait, balance. Continue step ups with R knee blocking.   Begin Nustep when able to sit comfortably.        Patient will benefit from skilled therapeutic intervention in order to improve the following deficits and impairments:  Abnormal gait, Improper body mechanics, Decreased coordination, Decreased mobility, Postural dysfunction, Decreased activity tolerance, Decreased strength, Decreased balance,  Difficulty walking, Decreased endurance, Cardiopulmonary status limiting activity  Visit Diagnosis: Muscle weakness (generalized)  Unsteadiness on feet  Difficulty in walking, not elsewhere classified  Other symptoms and signs involving the musculoskeletal system     Problem List Patient Active Problem List   Diagnosis Date Noted  . Morbid obesity ([place]) 07/30/2015  . OSA (obstructive sleep apnea) 01/22/2015  . OA (osteoarthritis) of hip 04/20/2014  . Bladder neck contracture 04/20/2014  . Lumbago 07/05/2012  . Spondylosis with myelopathy, lumbar region 07/05/2012  . Degenerative arthritis of hip 09/22/2011  . Degenerative disc disease, lumbar 09/22/2011  . Acute on chronic renal failure ([place]) 08/07/2011  . BPH (benign prostatic hyperplasia) 08/07/2011  . Prostate cancer ([place]) 08/07/2011  . Hypertension 08/07/2011  . Diabetes mellitus type 2 with complications ([place]) 123456  . Lumbar disc disease 08/07/2011   Manuel [name], [place]; [place]  Manuel [name] 08/20/2016, 5:16 PM  Manuel [name] [place], [place], [address] Phone: [phone]   Fax:  [phone]  Name: ARISTIDES GROSS MRN: [id number] Date of Birth: Jul 29, 1935

## 2016-08-25 ENCOUNTER — Ambulatory Visit (HOSPITAL_COMMUNITY): Payer: Medicare Other

## 2016-08-25 DIAGNOSIS — R262 Difficulty in walking, not elsewhere classified: Secondary | ICD-10-CM

## 2016-08-25 DIAGNOSIS — R2689 Other abnormalities of gait and mobility: Secondary | ICD-10-CM

## 2016-08-25 DIAGNOSIS — R2681 Unsteadiness on feet: Secondary | ICD-10-CM

## 2016-08-25 DIAGNOSIS — R29898 Other symptoms and signs involving the musculoskeletal system: Secondary | ICD-10-CM

## 2016-08-25 DIAGNOSIS — M6281 Muscle weakness (generalized): Secondary | ICD-10-CM | POA: Diagnosis not present

## 2016-08-25 NOTE — Therapy (Signed)
[facility] [address] [place], [place], [address] Phone: [phone]   Fax:  [phone]  Physical Therapy Treatment  Patient Details  Name: Manuel [name] MRN: [id number] Date of Birth: Oct 30, 1935 Referring Provider: Glenna Fellows   Encounter Date: 08/25/2016      PT End of Session - 08/25/16 1319    Visit Number 7   Number of Visits 16   Date for PT Re-Evaluation 09/01/16   Authorization Type UHC Medicare    Authorization Time Period 08/04/16 to 10/04/16   Authorization - Visit Number 7   Authorization - Number of Visits 10   PT Start Time 1301   PT Stop Time [id number]   PT Time Calculation (min) 46 min   Activity Tolerance Patient tolerated treatment well   Behavior During Therapy [facility] for tasks assessed/performed      Past Medical History:  Diagnosis Date  . Arthritis   . Assistance needed for mobility    walker  . Back pain   . Cancer (HCC)    PROSTATE -- RADIATION SEED IMPLANT  . Diabetes mellitus    DX SINCE 1998  . Heart murmur   . Hypertension   . Poor balance   . Sleep apnea     Past Surgical History:  Procedure Laterality Date  . BACK SURGERY    . CHOLECYSTECTOMY    . COLONOSCOPY N/A 03/13/2016   Procedure: COLONOSCOPY;  Surgeon: Rogene Houston, MD;  [facility];  Service: Endoscopy;  Laterality: N/A;  1255  . CYSTOSCOPY WITH URETHRAL DILATATION N/A 04/20/2014   Procedure: CYSTOSCOPY WITH URETHRAL DILATATION AND FOLEY PLACEMENT;  Surgeon: Gearlean Alf, MD;  Location: [facility];  Service: Orthopedics;  Laterality: N/A;  . EYE SURGERY     CATARACT BOTH EYES 2014  . Prostate radiation seed implantation    . PROSTATE SURGERY     with seed implants  . TOTAL HIP ARTHROPLASTY Right 04/20/2014   Procedure: RIGHT TOTAL HIP ARTHROPLASTY ANTERIOR APPROACH;  Surgeon: Gearlean Alf, MD;  Location: [facility];  Service: Orthopedics;  Laterality: Right;  . TOTAL HIP ARTHROPLASTY Left 08/14/2015   Procedure: LEFT TOTAL HIP  ARTHROPLASTY ANTERIOR APPROACH;  Surgeon: Gaynelle Arabian, MD;  [facility];  Service: Orthopedics;  Laterality: Left;  . TRANSURETHRAL RESECTION OF PROSTATE  2012  . VASECTOMY    . VASECTOMY      There were no vitals filed for this visit.      Subjective Assessment - 08/25/16 1318    Subjective Pt stated he continues to have pain Bil Buttocks knots/boils, reports they are getting smaller.   Pertinent History HTN, DM, OSA, BPH, hx of L3-L4 surgery, eye problems with blurred vision, B anterior hips, hx of prostate cancer 10 years ago   Patient Stated Goals get strength back, get walking better and potentially get rid of device    Currently in Pain? Yes   Pain Score 5    Pain Location Buttocks   Pain Orientation Right;Left  knots/boils   Pain Descriptors / Indicators Sore   Pain Type Acute pain   Pain Onset In the past 7 days   Pain Frequency Constant   Aggravating Factors  sitting   Pain Relieving Factors laying supine   Effect of Pain on Daily Activities decreased ADLs                         [facility] Adult PT Treatment/Exercise -  08/25/16 0001      Knee/Hip Exercises: Standing   Forward Step Up Left;15 reps;Hand Hold: 2;Step Height: 4"  minimal HHA   Forward Step Up Limitations 4in step     Knee/Hip Exercises: Seated   Sit to Sand 10 reps;without UE support  elevated height to 23in     Knee/Hip Exercises: Supine   Quad Sets Right;2 sets;15 reps   Quad Sets Limitations tactile cueing to improve activation   Short Arc Quad Sets Limitations AAROM with small bolster and russian   Heel Slides 15 reps   Heel Slides Limitations 5" holds with quad sets   Bridges 15 reps   Other Supine Knee/Hip Exercises supine hip ABD red TB 2x10     Knee/Hip Exercises: Prone   Hamstring Curl 10 reps   Hip Extension 10 reps     Modalities   Modalities Chiropractor Stimulation Location distal Rt quad   Tour manager Action Russian estim   Electrical Stimulation Parameters 10/30 co-contract x 8   Electrical Stimulation Goals Strength                  PT Short Term Goals - 08/04/16 1756      PT SHORT TERM GOAL #1   Title Patient to be able to ambulate 425ft in 3MWT with LRAD in order to demonstrate improved mobility and function    Time 4   Period Weeks   Status New     PT SHORT TERM GOAL #2   Title Patient to maintain correct posture at least 75% of the time in order to maintain low levels of pain and improve balance    Time 4   Period Weeks   Status New     PT SHORT TERM GOAL #3   Title Patient to demonstrate ability to complete TUG in 15 seconds or less with LRAD in order to show improved balance/reduced fall risk    Time 4   Period Weeks   Status New     PT SHORT TERM GOAL #4   Title Patient to be able to correctly and consistently perform appropriate HEP, to be updated PRN    Time 4   Period Weeks   Status New           PT Long Term Goals - 08/04/16 1758      PT LONG TERM GOAL #1   Title Patient to score at least 4-/5 in all tested muscle groups in R LE and 5/5 in all other tested muscles in order to improve mobilty and gait    Time 8   Period Weeks   Status New     PT LONG TERM GOAL #2   Title Patient to demonstrate correct functional mechanics for all functional tasks, including bed mobility and functional lifting, in order to protect lumbar area and reduce fall risk    Time 8   Period Weeks   Status New     PT LONG TERM GOAL #3   Title Patient to be able to ambulate at least 673ft during 3MWT with LRAD in order to show improved overall mobility    Time 8   Period Weeks   Status New     PT LONG TERM GOAL #4   Title Patient to be able to maintain SLS B LEs at least 30 seconds in order to show improved balance skills and reduced fall risk    Time 8  Period Weeks   Status New     PT LONG TERM GOAL #5   Title Patient to be participatory in  regular exercise program, at least 20 minutes in duration, at least 4 days per week, in order to maintain functional gains and assist in improving overall health status    Time 8   Period Weeks   Status New               Plan - 08/25/16 1356    Clinical Impression Statement Continued session focus on improvinge LE strengthening primary Lt lE quad and gluteal musculature.  Began session with russian estim to assist with quad strengthening Lt LE, good distal quad contraction noted though no active movements.   Pt making progress though slow with improvements of ability to complete sit to stand with no UE A from 23 in height and able to complete step up training on 4in height with minimal UE A.  Pt limited by fatigue, no reports of increased pain through session.  Pt able to tolerate the seated exercises with no reports of increased discomfort.     Rehab Potential Good   PT Frequency 2x / week   PT Duration 8 weeks   PT Treatment/Interventions ADLs/Self Care Home Management;Cryotherapy;Moist Heat;Electrical Stimulation;Gait training;DME Instruction;Stair training;Functional mobility training;Therapeutic activities;Therapeutic exercise;Balance training;Neuromuscular re-education;Patient/family education;Manual techniques;Passive range of motion;Energy conservation;Taping   PT Next Visit Plan Continue with russian estim to distal quad for strengthening; continue focus on CKC and OKC strength, pre-gait, balance. Continue step ups with R knee blocking.   Begin Nustep when able to sit comfortably.        Patient will benefit from skilled therapeutic intervention in order to improve the following deficits and impairments:  Abnormal gait, Improper body mechanics, Decreased coordination, Decreased mobility, Postural dysfunction, Decreased activity tolerance, Decreased strength, Decreased balance, Difficulty walking, Decreased endurance, Cardiopulmonary status limiting activity  Visit  Diagnosis: Muscle weakness (generalized)  Unsteadiness on feet  Difficulty in walking, not elsewhere classified  Other symptoms and signs involving the musculoskeletal system  Poor balance  Leg weakness, bilateral     Problem List Patient Active Problem List   Diagnosis Date Noted  . Morbid obesity ([place]) 07/30/2015  . OSA (obstructive sleep apnea) 01/22/2015  . OA (osteoarthritis) of hip 04/20/2014  . Bladder neck contracture 04/20/2014  . Lumbago 07/05/2012  . Spondylosis with myelopathy, lumbar region 07/05/2012  . Degenerative arthritis of hip 09/22/2011  . Degenerative disc disease, lumbar 09/22/2011  . Acute on chronic renal failure ([place]) 08/07/2011  . BPH (benign prostatic hyperplasia) 08/07/2011  . Prostate cancer ([place]) 08/07/2011  . Hypertension 08/07/2011  . Diabetes mellitus type 2 with complications ([place]) [id number]  . Lumbar disc disease 08/07/2011   Ihor Austin, [place]; [place]  Aldona Lento 08/25/2016, 2:02 PM  [facility] [place], [place], [address] Phone: [phone]   Fax:  [phone]  Name: Manuel [name] MRN: [id number] Date of Birth: [birth year]

## 2016-08-27 ENCOUNTER — Ambulatory Visit (HOSPITAL_COMMUNITY): Payer: Medicare Other

## 2016-08-27 DIAGNOSIS — M6281 Muscle weakness (generalized): Secondary | ICD-10-CM | POA: Diagnosis not present

## 2016-08-27 DIAGNOSIS — R2689 Other abnormalities of gait and mobility: Secondary | ICD-10-CM

## 2016-08-27 DIAGNOSIS — R2681 Unsteadiness on feet: Secondary | ICD-10-CM

## 2016-08-27 DIAGNOSIS — R29898 Other symptoms and signs involving the musculoskeletal system: Secondary | ICD-10-CM

## 2016-08-27 DIAGNOSIS — R262 Difficulty in walking, not elsewhere classified: Secondary | ICD-10-CM

## 2016-08-27 NOTE — Therapy (Signed)
[facility] [address] [place], [place], [address] Phone: [phone]   Fax:  [phone]  Physical Therapy Treatment  Patient Details  Name: Manuel [name] MRN: [id number] Date of Birth: July 28, 1935 Referring Provider: Glenna Fellows   Encounter Date: 08/27/2016      PT End of Session - 08/27/16 1702    Visit Number 8   Number of Visits 16   Date for PT Re-Evaluation 09/01/16   Authorization Type U[employer] Medicare    Authorization Time Period 08/04/16 to 10/04/16   Authorization - Visit Number 8   Authorization - Number of Visits 10   PT Start Time [id number]  pt late for apt   PT Stop Time 1735   PT Time Calculation (min) 41 min   Equipment Utilized During Treatment Gait belt   Activity Tolerance Patient tolerated treatment well   Behavior During Therapy [facility] for tasks assessed/performed      Past Medical History:  Diagnosis Date  . Arthritis   . Assistance needed for mobility    walker  . Back pain   . Cancer (HCC)    PROSTATE -- RADIATION SEED IMPLANT  . Diabetes mellitus    DX SINCE 1998  . Heart murmur   . Hypertension   . Poor balance   . Sleep apnea     Past Surgical History:  Procedure Laterality Date  . BACK SURGERY    . CHOLECYSTECTOMY    . COLONOSCOPY N/A 03/13/2016   Procedure: COLONOSCOPY;  Surgeon: Rogene Houston, MD;  [facility];  Service: Endoscopy;  Laterality: N/A;  1255  . CYSTOSCOPY WITH URETHRAL DILATATION N/A 04/20/2014   Procedure: CYSTOSCOPY WITH URETHRAL DILATATION AND FOLEY PLACEMENT;  Surgeon: Gearlean Alf, MD;  Location: [place];  Service: Orthopedics;  Laterality: N/A;  . EYE SURGERY     CATARACT BOTH EYES 2014  . Prostate radiation seed implantation    . PROSTATE SURGERY     with seed implants  . TOTAL HIP ARTHROPLASTY Right 04/20/2014   Procedure: RIGHT TOTAL HIP ARTHROPLASTY ANTERIOR APPROACH;  Surgeon: Gearlean Alf, MD;  Location: [facility];  Service: Orthopedics;  Laterality: Right;  .  TOTAL HIP ARTHROPLASTY Left 08/14/2015   Procedure: LEFT TOTAL HIP ARTHROPLASTY ANTERIOR APPROACH;  Surgeon: Gaynelle Arabian, MD;  [facility];  Service: Orthopedics;  Laterality: Left;  . TRANSURETHRAL RESECTION OF PROSTATE  2012  . VASECTOMY    . VASECTOMY      There were no vitals filed for this visit.      Subjective Assessment - 08/27/16 1657    Subjective Pt stated he continues to be sensitive Bil buttocks area, went to MD earlier today and reports they are getting smaller in size.  No real reports of pain today, does report soreness lower back.   Pertinent History HTN, DM, OSA, BPH, hx of L3-L4 surgery, eye problems with blurred vision, B anterior hips, hx of prostate cancer 10 years ago   Patient Stated Goals get strength back, get walking better and potentially get rid of device    Currently in Pain? Yes   Pain Score 0-No pain  soreness lower back   Pain Location Back   Pain Descriptors / Indicators Sore   Pain Type Acute pain   Pain Onset In the past 7 days   Pain Frequency Constant   Aggravating Factors  certain positions, standing for long periods or bending forward   Pain Relieving Factors laying supine  Effect of Pain on Daily Activities decreased ADLs                         [facility] Adult PT Treatment/Exercise - 08/27/16 0001      Knee/Hip Exercises: Standing   Forward Step Up Right;1 set;10 reps;Hand Hold: 2;Step Height: 4"   Forward Step Up Limitations increased fatigue; cueing required to decrease UE A   Gait Training 3 min walk test 289 ft     Knee/Hip Exercises: Seated   Sit to Sand 10 reps;without UE support  elevated height     Knee/Hip Exercises: Supine   Quad Sets Right;2 sets;15 reps   [employer] Limitations tactile cueing   Short Arc [employer];Right;15 reps   Short Arc Quad Sets Limitations AAROM Rt LE   Heel Slides 15 reps   Heel Slides Limitations 5" holds with quad sets   Bridges 20 reps   Other Supine Knee/Hip  Exercises supine hip ABD red TB 2x10                  PT Short Term Goals - 08/04/16 1756      PT SHORT TERM GOAL #1   Title Patient to be able to ambulate 476ft in 3MWT with LRAD in order to demonstrate improved mobility and function    Time 4   Period Weeks   Status New     PT SHORT TERM GOAL #2   Title Patient to maintain correct posture at least 75% of the time in order to maintain low levels of pain and improve balance    Time 4   Period Weeks   Status New     PT SHORT TERM GOAL #3   Title Patient to demonstrate ability to complete TUG in 15 seconds or less with LRAD in order to show improved balance/reduced fall risk    Time 4   Period Weeks   Status New     PT SHORT TERM GOAL #4   Title Patient to be able to correctly and consistently perform appropriate HEP, to be updated PRN    Time 4   Period Weeks   Status New           PT Long Term Goals - 08/04/16 1758      PT LONG TERM GOAL #1   Title Patient to score at least 4-/5 in all tested muscle groups in R LE and 5/5 in all other tested muscles in order to improve mobilty and gait    Time 8   Period Weeks   Status New     PT LONG TERM GOAL #2   Title Patient to demonstrate correct functional mechanics for all functional tasks, including bed mobility and functional lifting, in order to protect lumbar area and reduce fall risk    Time 8   Period Weeks   Status New     PT LONG TERM GOAL #3   Title Patient to be able to ambulate at least 642ft during 3MWT with LRAD in order to show improved overall mobility    Time 8   Period Weeks   Status New     PT LONG TERM GOAL #4   Title Patient to be able to maintain SLS B LEs at least 30 seconds in order to show improved balance skills and reduced fall risk    Time 8   Period Weeks   Status New     PT LONG  TERM GOAL #5   Title Patient to be participatory in regular exercise program, at least 20 minutes in duration, at least 4 days per week, in order to  maintain functional gains and assist in improving overall health status    Time 8   Period Weeks   Status New               Plan - 08/27/16 1830    Clinical Impression Statement Pt late for apt, held estim this session due to time restrctions.  Continued session focus on improving LE strengtheing primarly LT quad and gluteal musculature.  Pt is progressing well wtih noted reduced difficulty completing heel slides and improved tactile contraction noted with distal quad sets.  Pt does continue to demonstrate significant weakness noted by inability to complete sit to stand from elevated surface without difficulty.  Pt making improvements wiht activity tolerance with ability to ambulate 240ft in 3 min walk test.     Rehab Potential Good   PT Frequency 2x / week   PT Duration 8 weeks   PT Treatment/Interventions ADLs/Self Care Home Management;Cryotherapy;Moist Heat;Electrical Stimulation;Gait training;DME Instruction;Stair training;Functional mobility training;Therapeutic activities;Therapeutic exercise;Balance training;Neuromuscular re-education;Patient/family education;Manual techniques;Passive range of motion;Energy conservation;Taping   PT Next Visit Plan Continue with russian estim to distal quad for strengthening; continue focus on CKC and [place] strength, pre-gait, balance. Continue step ups with R knee blocking.   Begin Nustep when able to sit comfortably.        Patient will benefit from skilled therapeutic intervention in order to improve the following deficits and impairments:  Abnormal gait, Improper body mechanics, Decreased coordination, Decreased mobility, Postural dysfunction, Decreased activity tolerance, Decreased strength, Decreased balance, Difficulty walking, Decreased endurance, Cardiopulmonary status limiting activity  Visit Diagnosis: Muscle weakness (generalized)  Unsteadiness on feet  Difficulty in walking, not elsewhere classified  Other symptoms and signs  involving the musculoskeletal system  Poor balance  Leg weakness, bilateral     Problem List Patient Active Problem List   Diagnosis Date Noted  . Morbid obesity ([place]) 07/30/2015  . OSA (obstructive sleep apnea) 01/22/2015  . OA (osteoarthritis) of hip 04/20/2014  . Bladder neck contracture 04/20/2014  . Lumbago 07/05/2012  . Spondylosis with myelopathy, lumbar region 07/05/2012  . Degenerative arthritis of hip 09/22/2011  . Degenerative disc disease, lumbar 09/22/2011  . Acute on chronic renal failure ([place]) 08/07/2011  . BPH (benign prostatic hyperplasia) 08/07/2011  . Prostate cancer ([place]) 08/07/2011  . Hypertension 08/07/2011  . Diabetes mellitus type 2 with complications ([place]) 123456  . Lumbar disc disease 08/07/2011   Ihor Austin, [place]; [place]  Aldona Lento 08/27/2016, 6:35 PM  [facility] [place], [place], [address] Phone: [phone]   Fax:  [phone]  Name: Manuel [name] MRN: [id number] Date of Birth: 11/10/1935

## 2016-09-01 ENCOUNTER — Ambulatory Visit (HOSPITAL_COMMUNITY): Payer: Medicare Other | Attending: Neurosurgery

## 2016-09-01 DIAGNOSIS — R2689 Other abnormalities of gait and mobility: Secondary | ICD-10-CM | POA: Insufficient documentation

## 2016-09-01 DIAGNOSIS — R262 Difficulty in walking, not elsewhere classified: Secondary | ICD-10-CM | POA: Diagnosis present

## 2016-09-01 DIAGNOSIS — R29898 Other symptoms and signs involving the musculoskeletal system: Secondary | ICD-10-CM

## 2016-09-01 DIAGNOSIS — R2681 Unsteadiness on feet: Secondary | ICD-10-CM | POA: Insufficient documentation

## 2016-09-01 DIAGNOSIS — M6281 Muscle weakness (generalized): Secondary | ICD-10-CM | POA: Insufficient documentation

## 2016-09-01 NOTE — Therapy (Signed)
[facility] [address] [place], [place], [address] Phone: [phone]   Fax:  [phone]  Physical Therapy Treatment  Patient Details  Name: Manuel [name] MRN: [id number] Date of Birth: March 22, 1935 Referring Provider: Glenna Fellows   Encounter Date: 09/01/2016      PT End of Session - 09/01/16 1808    Visit Number 9   Number of Visits 16   Date for PT Re-Evaluation 09/01/16   Authorization Type UHC Medicare    Authorization Time Period 08/04/16 to 10/04/16   Authorization - Visit Number 9   Authorization - Number of Visits 10   PT Start Time 1602   PT Stop Time 1654   PT Time Calculation (min) 52 min   Equipment Utilized During Treatment Gait belt   Activity Tolerance Patient tolerated treatment well;Patient limited by fatigue;No increased pain   Behavior During Therapy [facility] for tasks assessed/performed      Past Medical History:  Diagnosis Date  . Arthritis   . Assistance needed for mobility    walker  . Back pain   . Cancer (HCC)    PROSTATE -- RADIATION SEED IMPLANT  . Diabetes mellitus    DX SINCE 1998  . Heart murmur   . Hypertension   . Poor balance   . Sleep apnea     Past Surgical History:  Procedure Laterality Date  . BACK SURGERY    . CHOLECYSTECTOMY    . COLONOSCOPY N/A 03/13/2016   Procedure: COLONOSCOPY;  Surgeon: Rogene Houston, MD;  [facility];  Service: Endoscopy;  Laterality: N/A;  1255  . CYSTOSCOPY WITH URETHRAL DILATATION N/A 04/20/2014   Procedure: CYSTOSCOPY WITH URETHRAL DILATATION AND FOLEY PLACEMENT;  Surgeon: Gearlean Alf, MD;  Location: [facility];  Service: Orthopedics;  Laterality: N/A;  . EYE SURGERY     CATARACT BOTH EYES 2014  . Prostate radiation seed implantation    . PROSTATE SURGERY     with seed implants  . TOTAL HIP ARTHROPLASTY Right 04/20/2014   Procedure: RIGHT TOTAL HIP ARTHROPLASTY ANTERIOR APPROACH;  Surgeon: Gearlean Alf, MD;  Location: [facility];  Service:  Orthopedics;  Laterality: Right;  . TOTAL HIP ARTHROPLASTY Left 08/14/2015   Procedure: LEFT TOTAL HIP ARTHROPLASTY ANTERIOR APPROACH;  Surgeon: Gaynelle Arabian, MD;  [facility];  Service: Orthopedics;  Laterality: Left;  . TRANSURETHRAL RESECTION OF PROSTATE  2012  . VASECTOMY    . VASECTOMY      There were no vitals filed for this visit.      Subjective Assessment - 09/01/16 1601    Subjective (P)  Pt stated he continues to be sensitive buttocks area, reports they are getting smaller in size.  No reports of pain today   Pertinent History (P)  HTN, DM, OSA, BPH, hx of L3-L4 surgery, eye problems with blurred vision, B anterior hips, hx of prostate cancer 10 years ago   Patient Stated Goals (P)  get strength back, get walking better and potentially get rid of device                          [facility] Adult PT Treatment/Exercise - 09/01/16 0001      Ambulation/Gait   Ambulation Distance (Feet) --  3 min walk test initial and end of session: 345, 339   Assistive device Rolling walker   Gait Pattern Step-through pattern;Trunk flexed;Decreased stride length;Decreased stance time - right  Gait Comments reduced gait speed, some hyperextension R knee, reduced stance time R LE, flexed at hips      Knee/Hip Exercises: Aerobic   Nustep 8 min L2, resistance 2      Knee/Hip Exercises: Standing   Forward Step Up Right;1 set;10 reps;Hand Hold: 2;Step Height: 4"   Forward Step Up Limitations increased fatigue; cueing required to decrease UE A   Gait Training 3 min walk test initial (345 ft) and at EOS (339 ft)     Knee/Hip Exercises: Seated   Long Arc Quad AAROM;Right;2 sets;10 reps   Heel Slides AROM;Right;2 sets;10 reps   Sit to Sand 10 reps;without UE support  elevated height     Knee/Hip Exercises: Supine   Quad Sets Right;2 sets;15 reps   Short Arc [employer];Right;15 reps   Short Arc [employer] Limitations with russian estim co-contract x 8 min   Heel Slides  15 reps   Heel Slides Limitations 5" holds with quad sets   Bridges 2 sets;15 reps     Acupuncturist Location distal Rt quad   Chartered certified accountant Russian Estim for Rt quad   Electrical Stimulation Parameters 10/30 co-contract x 75min per pt tolerance    Electrical Stimulation Goals Strength                  PT Short Term Goals - 08/04/16 1756      PT SHORT TERM GOAL #1   Title Patient to be able to ambulate 414ft in 3MWT with LRAD in order to demonstrate improved mobility and function    Time 4   Period Weeks   Status New     PT SHORT TERM GOAL #2   Title Patient to maintain correct posture at least 75% of the time in order to maintain low levels of pain and improve balance    Time 4   Period Weeks   Status New     PT SHORT TERM GOAL #3   Title Patient to demonstrate ability to complete TUG in 15 seconds or less with LRAD in order to show improved balance/reduced fall risk    Time 4   Period Weeks   Status New     PT SHORT TERM GOAL #4   Title Patient to be able to correctly and consistently perform appropriate HEP, to be updated PRN    Time 4   Period Weeks   Status New           PT Long Term Goals - 08/04/16 1758      PT LONG TERM GOAL #1   Title Patient to score at least 4-/5 in all tested muscle groups in R LE and 5/5 in all other tested muscles in order to improve mobilty and gait    Time 8   Period Weeks   Status New     PT LONG TERM GOAL #2   Title Patient to demonstrate correct functional mechanics for all functional tasks, including bed mobility and functional lifting, in order to protect lumbar area and reduce fall risk    Time 8   Period Weeks   Status New     PT LONG TERM GOAL #3   Title Patient to be able to ambulate at least 635ft during 3MWT with LRAD in order to show improved overall mobility    Time 8   Period Weeks   Status New     PT LONG TERM GOAL #4  Title Patient to be able to  maintain SLS B LEs at least 30 seconds in order to show improved balance skills and reduced fall risk    Time 8   Period Weeks   Status New     PT LONG TERM GOAL #5   Title Patient to be participatory in regular exercise program, at least 20 minutes in duration, at least 4 days per week, in order to maintain functional gains and assist in improving overall health status    Time 8   Period Weeks   Status New               Plan - 09/01/16 1810    Clinical Impression Statement Continued session focus on improving LE strengtheing for quad and gluteal musculature.  Pt is making progressing noted by increased activity tolerance today with ability to complete 3 min walk test initial session as well as EOS with improved distance both times.  Pt improving control with less cueing required for form and increased ease with mat activities.  No reports of pain through session.  Pt reports the spots on buttocks are getting smaller and no reports of pain throigh session, ended session with Nustep for activity tolerance and strengthening (no charge for unsupervised).     Rehab Potential Good   PT Frequency 2x / week   PT Duration 8 weeks   PT Treatment/Interventions ADLs/Self Care Home Management;Cryotherapy;Moist Heat;Electrical Stimulation;Gait training;DME Instruction;Stair training;Functional mobility training;Therapeutic activities;Therapeutic exercise;Balance training;Neuromuscular re-education;Patient/family education;Manual techniques;Passive range of motion;Energy conservation;Taping   PT Next Visit Plan Reassessment next session.  Progress to more CKC including sit to stands, lateral and forward step ups, lunges and pregait/balance activities.  Continue Nustep for activity tolerance.      Patient will benefit from skilled therapeutic intervention in order to improve the following deficits and impairments:  Abnormal gait, Improper body mechanics, Decreased coordination, Decreased mobility,  Postural dysfunction, Decreased activity tolerance, Decreased strength, Decreased balance, Difficulty walking, Decreased endurance, Cardiopulmonary status limiting activity  Visit Diagnosis: Muscle weakness (generalized)  Unsteadiness on feet  Difficulty in walking, not elsewhere classified  Other symptoms and signs involving the musculoskeletal system  Poor balance  Leg weakness, bilateral     Problem List Patient Active Problem List   Diagnosis Date Noted  . Morbid obesity ([place]) 07/30/2015  . OSA (obstructive sleep apnea) 01/22/2015  . OA (osteoarthritis) of hip 04/20/2014  . Bladder neck contracture 04/20/2014  . Lumbago 07/05/2012  . Spondylosis with myelopathy, lumbar region 07/05/2012  . Degenerative arthritis of hip 09/22/2011  . Degenerative disc disease, lumbar 09/22/2011  . Acute on chronic renal failure ([place]) 08/07/2011  . BPH (benign prostatic hyperplasia) 08/07/2011  . Prostate cancer ([place]) 08/07/2011  . Hypertension 08/07/2011  . Diabetes mellitus type 2 with complications ([place]) [id number]  . Lumbar disc disease 08/07/2011   Ihor Austin, [place]; [place]  Aldona Lento 09/01/2016, 6:18 PM  [facility] [address] [place], [place], [address] Phone: [phone]   Fax:  [phone]  Name: Manuel [name] MRN: [id number] Date of Birth: August 06, 1935

## 2016-09-03 ENCOUNTER — Ambulatory Visit (HOSPITAL_COMMUNITY): Payer: Medicare Other | Admitting: Physical Therapy

## 2016-09-03 DIAGNOSIS — R2681 Unsteadiness on feet: Secondary | ICD-10-CM

## 2016-09-03 DIAGNOSIS — R29898 Other symptoms and signs involving the musculoskeletal system: Secondary | ICD-10-CM

## 2016-09-03 DIAGNOSIS — R262 Difficulty in walking, not elsewhere classified: Secondary | ICD-10-CM

## 2016-09-03 DIAGNOSIS — R2689 Other abnormalities of gait and mobility: Secondary | ICD-10-CM

## 2016-09-03 DIAGNOSIS — M6281 Muscle weakness (generalized): Secondary | ICD-10-CM | POA: Diagnosis not present

## 2016-09-03 NOTE — Therapy (Signed)
[facility] [address] [place], [place], [address] Phone: [phone]   Fax:  [phone]  Physical Therapy Treatment (Re-Assessment)  Patient Details  Name: Manuel [name] MRN: [id number] Date of Birth: [birth year] Referring Provider: Glenna Fellows   Encounter Date: 09/03/2016      PT End of Session - 09/03/16 1635    Visit Number 10   Number of Visits 18   Date for PT Re-Evaluation 10/01/16   Authorization Type [employer] Medicare (G-codes done 10th session)   Authorization Time Period 08/04/16 to 10/04/16   Authorization - Visit Number 10   Authorization - Number of Visits 20   PT Start Time [id number]   PT Stop Time 1518   PT Time Calculation (min) 46 min   Equipment Utilized During Treatment Gait belt   Activity Tolerance Patient tolerated treatment well   Behavior During Therapy [facility] for tasks assessed/performed      Past Medical History:  Diagnosis Date  . Arthritis   . Assistance needed for mobility    walker  . Back pain   . Cancer (HCC)    PROSTATE -- RADIATION SEED IMPLANT  . Diabetes mellitus    DX SINCE 1998  . Heart murmur   . Hypertension   . Poor balance   . Sleep apnea     Past Surgical History:  Procedure Laterality Date  . BACK SURGERY    . CHOLECYSTECTOMY    . COLONOSCOPY N/A 03/13/2016   Procedure: COLONOSCOPY;  Surgeon: Rogene Houston, MD;  [facility];  Service: Endoscopy;  Laterality: N/A;  1255  . CYSTOSCOPY WITH URETHRAL DILATATION N/A 04/20/2014   Procedure: CYSTOSCOPY WITH URETHRAL DILATATION AND FOLEY PLACEMENT;  Surgeon: Gearlean Alf, MD;  Location: [facility];  Service: Orthopedics;  Laterality: N/A;  . EYE SURGERY     CATARACT BOTH EYES 2014  . Prostate radiation seed implantation    . PROSTATE SURGERY     with seed implants  . TOTAL HIP ARTHROPLASTY Right 04/20/2014   Procedure: RIGHT TOTAL HIP ARTHROPLASTY ANTERIOR APPROACH;  Surgeon: Gearlean Alf, MD;  Location: [facility];  Service:  Orthopedics;  Laterality: Right;  . TOTAL HIP ARTHROPLASTY Left 08/14/2015   Procedure: LEFT TOTAL HIP ARTHROPLASTY ANTERIOR APPROACH;  Surgeon: Gaynelle Arabian, MD;  [facility];  Service: Orthopedics;  Laterality: Left;  . TRANSURETHRAL RESECTION OF PROSTATE  2012  . VASECTOMY    . VASECTOMY      There were no vitals filed for this visit.      Subjective Assessment - 09/03/16 1434    Subjective Patient arrives today stating the MD took him off of eliquis, otherwise no major changes. He is feeling well in general, he is definitely getting better. Patient reports that most everything is getting easier for him to do except for sit to stand with no UEs. Patient cannot identify anything in specific that is hard for him to do. He feels especially weak in his knees, they have buckled on him in the past while he was taking a step but he was able to catch it. Nothing else major going on. He rates himself as being 75/100, with the last 25% being fear of falling and not being able to walk extended distances without walker/R LE strength/balance.    Pertinent History HTN, DM, OSA, BPH, hx of L3-L4 surgery, eye problems with blurred vision, B anterior hips, hx of prostate cancer 10 years ago   How long  can you sit comfortably? 9/7- 1 hour, 15 minutes    How long can you stand comfortably? 9/7- 15 minutes with walker    How long can you walk comfortably? 9/7- walked from one end of [employer] to the other with walker   Patient Stated Goals get strength back, get walking better and potentially get rid of device    Currently in Pain? Yes   Pain Score 8    Pain Location Hip   Pain Orientation Left   Pain Descriptors / Indicators Burning   Pain Type Acute pain   Pain Radiating Towards none    Pain Onset More than a month ago   Pain Frequency Intermittent   Aggravating Factors  certain positions, weight bearing    Pain Relieving Factors getting weight off of hip    Effect of Pain on Daily Activities none              [facility] PT Assessment - 09/03/16 0001      Strength   Right Hip Flexion 2+/5   Right Hip ABduction 2/5   Left Hip Flexion 4/5   Left Hip ABduction 2+/5   Right Knee Flexion 4-/5   Right Knee Extension 2/5   Left Knee Flexion 4+/5   Left Knee Extension 5/5   Right Ankle Dorsiflexion 3/5   Left Ankle Dorsiflexion 5/5     6 minute walk test results    Aerobic Endurance Distance Walked 384   Endurance additional comments 3MWT, walker     High Level Balance   High Level Balance Comments TUG 18 with walker                              PT Education - 09/03/16 1632    Education provided Yes   Education Details progress with skilled PT services, POC moving forward; strong encouragement and techniques to increase compliance to HEP at home, importance of regular activity and HEP performance at home; possible recommendation for knee brace if strength continues to not improve/patient continues to remain potential fall risk due to knee buckling secondary to weakness   Person(s) Educated Patient;Spouse   Methods Explanation   Comprehension Verbalized understanding          PT Short Term Goals - 09/03/16 1503      PT SHORT TERM GOAL #1   Title Patient to be able to ambulate 459ft in 3MWT with LRAD in order to demonstrate improved mobility and function    Baseline 9/7- 326ft with walker    Time 4   Period Weeks   Status On-going     PT SHORT TERM GOAL #2   Title Patient to maintain correct posture at least 75% of the time in order to maintain low levels of pain and improve balance    Baseline 9/7- ongoing, continues to flex trunk with walker    Time 4   Period Weeks   Status On-going     PT SHORT TERM GOAL #3   Title Patient to demonstrate ability to complete TUG in 15 seconds or less with LRAD in order to show improved balance/reduced fall risk    Baseline 9/7- 18   Time 4   Period Weeks   Status On-going     PT SHORT TERM GOAL #4   Title  Patient to be able to correctly and consistently perform appropriate HEP, to be updated PRN    Baseline 9/7- "not going too  good"; he just doesn't do them. States poor compliance.    Time 4   Period Weeks   Status On-going           PT Long Term Goals - 09/03/16 1506      PT LONG TERM GOAL #1   Title Patient to score at least 4-/5 in all tested muscle groups in R LE and 5/5 in all other tested muscles in order to improve mobilty and gait    Time 8   Period Weeks   Status On-going     PT LONG TERM GOAL #2   Title Patient to demonstrate correct functional mechanics for all functional tasks, including bed mobility and functional lifting, in order to protect lumbar area and reduce fall risk    Time 8   Period Weeks   Status On-going     PT LONG TERM GOAL #3   Title Patient to be able to ambulate at least 622ft during with LRAD in order to show improved overall mobility    Baseline 9/7- 374ft    Time 8   Period Weeks   Status On-going     PT LONG TERM GOAL #4   Title Patient to be able to maintain SLS B LEs at least 30 seconds in order to show improved balance skills and reduced fall risk    Time 8   Period Weeks   Status On-going     PT LONG TERM GOAL #5   Title Patient to be participatory in regular exercise program, at least 20 minutes in duration, at least 4 days per week, in order to maintain functional gains and assist in improving overall health status    Time 8   Period Weeks   Status On-going               Plan - 09/03/16 1636    Clinical Impression Statement Re-assessment performed today. Patient appears to be making good progress with skilled PT services, with improvement in all objective measures at this time; patient does report however that his R knee has been buckling- while he has been able to prevent a fall thus far, it does remain an area of functional and safety related concern. Patient does continue to demonstrate functional weakness, ongoing  unsteadiness, and significant gait and postural impairments, as well as reduced functional task performance safety and skills. Patient admits that he does not perform his HEP, no appropriate reason given other than "I don't know, I just haven't been doing it"; extensive education provided on importance of HEP compliance and more, see education section for specific details. Recommend skilled PT services in order to continue addressing functional limitations and to assist in reaching optimal level of function.    Rehab Potential Good   PT Frequency 2x / week   PT Duration 4 weeks   PT Treatment/Interventions ADLs/Self Care Home Management;Cryotherapy;Moist Heat;Electrical Stimulation;Gait training;DME Instruction;Stair training;Functional mobility training;Therapeutic activities;Therapeutic exercise;Balance training;Neuromuscular re-education;Patient/family education;Manual techniques;Passive range of motion;Energy conservation;Taping   PT Next Visit Plan  Progress to more CKC including sit to stands, lateral and forward step ups, lunges and pregait/balance activities.  Continue Nustep for activity tolerance. Consider speaking to MD regarding knee brace.    PT Home Exercise Plan HHPT program for now   Consulted and Agree with Plan of Care Patient      Patient will benefit from skilled therapeutic intervention in order to improve the following deficits and impairments:  Abnormal gait, Improper body mechanics, Decreased coordination, Decreased  mobility, Postural dysfunction, Decreased activity tolerance, Decreased strength, Decreased balance, Difficulty walking, Decreased endurance, Cardiopulmonary status limiting activity  Visit Diagnosis: Muscle weakness (generalized)  Unsteadiness on feet  Difficulty in walking, not elsewhere classified  Other symptoms and signs involving the musculoskeletal system  Poor balance  Leg weakness, bilateral       G-Codes - 09-09-2016 1642    Functional  Assessment Tool Used Based on skilled clinical assessment of strength, gait, mobility, balance    Functional Limitation Mobility: Walking and moving around   Mobility: Walking and Moving Around Current Status [id number]) At least 40 percent but less than 60 percent impaired, limited or restricted   Mobility: Walking and Moving Around Goal Status [phone]) At least 40 percent but less than 60 percent impaired, limited or restricted      Problem List Patient Active Problem List   Diagnosis Date Noted  . Morbid obesity ([place]) 07/30/2015  . OSA (obstructive sleep apnea) 01/22/2015  . OA (osteoarthritis) of hip 04/20/2014  . Bladder neck contracture 04/20/2014  . Lumbago 07/05/2012  . Spondylosis with myelopathy, lumbar region 07/05/2012  . Degenerative arthritis of hip 09/22/2011  . Degenerative disc disease, lumbar 09/22/2011  . Acute on chronic renal failure ([place]) 08/07/2011  . BPH (benign prostatic hyperplasia) 08/07/2011  . Prostate cancer ([place]) 08/07/2011  . Hypertension 08/07/2011  . Diabetes mellitus type 2 with complications ([place]) 123456  . Lumbar disc disease 08/07/2011    Deniece Ree PT, DPT [facility] [address] [place], [place], [address] Phone: [phone]   Fax:  [phone]  Name: Manuel [name] MRN: [id number] Date of Birth: May 28, 1935

## 2016-09-08 ENCOUNTER — Ambulatory Visit (HOSPITAL_COMMUNITY): Payer: Medicare Other | Admitting: Physical Therapy

## 2016-09-08 DIAGNOSIS — R2681 Unsteadiness on feet: Secondary | ICD-10-CM

## 2016-09-08 DIAGNOSIS — M6281 Muscle weakness (generalized): Secondary | ICD-10-CM | POA: Diagnosis not present

## 2016-09-08 DIAGNOSIS — R29898 Other symptoms and signs involving the musculoskeletal system: Secondary | ICD-10-CM

## 2016-09-08 DIAGNOSIS — R262 Difficulty in walking, not elsewhere classified: Secondary | ICD-10-CM

## 2016-09-08 NOTE — Therapy (Signed)
[facility] [place], [place], [address] Phone: [phone]   Fax:  [phone]  Physical Therapy Treatment  Patient Details  Name: Manuel [name] MRN: [id number] Date of Birth: [birth year] Referring Provider: Glenna Fellows   Encounter Date: 09/08/2016      PT End of Session - 09/08/16 1535    Visit Number 11   Number of Visits 18   Date for PT Re-Evaluation 10/01/16   Authorization Type [employer] Medicare (G-codes done 10th session)   Authorization Time Period 08/04/16 to 10/04/16   Authorization - Visit Number 11   Authorization - Number of Visits 20   PT Start Time 1350   PT Stop Time 1430   PT Time Calculation (min) 40 min   Activity Tolerance Patient tolerated treatment well   Behavior During Therapy [facility] for tasks assessed/performed      Past Medical History:  Diagnosis Date  . Arthritis   . Assistance needed for mobility    walker  . Back pain   . Cancer (HCC)    PROSTATE -- RADIATION SEED IMPLANT  . Diabetes mellitus    DX SINCE 1998  . Heart murmur   . Hypertension   . Poor balance   . Sleep apnea     Past Surgical History:  Procedure Laterality Date  . BACK SURGERY    . CHOLECYSTECTOMY    . COLONOSCOPY N/A 03/13/2016   Procedure: COLONOSCOPY;  Surgeon: Rogene Houston, MD;  [facility];  Service: Endoscopy;  Laterality: N/A;  1255  . CYSTOSCOPY WITH URETHRAL DILATATION N/A 04/20/2014   Procedure: CYSTOSCOPY WITH URETHRAL DILATATION AND FOLEY PLACEMENT;  Surgeon: Gearlean Alf, MD;  Location: [facility];  Service: Orthopedics;  Laterality: N/A;  . EYE SURGERY     CATARACT BOTH EYES 2014  . Prostate radiation seed implantation    . PROSTATE SURGERY     with seed implants  . TOTAL HIP ARTHROPLASTY Right 04/20/2014   Procedure: RIGHT TOTAL HIP ARTHROPLASTY ANTERIOR APPROACH;  Surgeon: Gearlean Alf, MD;  Location: [facility];  Service: Orthopedics;  Laterality: Right;  . TOTAL HIP ARTHROPLASTY Left 08/14/2015    Procedure: LEFT TOTAL HIP ARTHROPLASTY ANTERIOR APPROACH;  Surgeon: Gaynelle Arabian, MD;  [facility];  Service: Orthopedics;  Laterality: Left;  . TRANSURETHRAL RESECTION OF PROSTATE  2012  . VASECTOMY    . VASECTOMY      There were no vitals filed for this visit.      Subjective Assessment - 09/08/16 1354    Subjective Patient arrives stating he is feeling well, he is getting more pain in his L hip especially at night time when he gets in a certain position he really feels some signficant burning pain. No numbness or tingling. No falls but his knee is continuing to try to buckle but he is able to continue to catch it. He is still not compliant with his HEP.    Pertinent History HTN, DM, OSA, BPH, hx of L3-L4 surgery, eye problems with blurred vision, B anterior hips, hx of prostate cancer 10 years ago   Patient Stated Goals get strength back, get walking better and potentially get rid of device    Currently in Pain? No/denies            [facility] PT Assessment - 09/08/16 0001      Observation/Other Assessments   Observations unable to reproduce or improve pain with quad stretch or femoral nerve flossing, repeated  lumbar motions                      [facility] Adult PT Treatment/Exercise - 09/08/16 0001      Knee/Hip Exercises: Stretches   Active Hamstring Stretch Right;3 reps;30 seconds   Active Hamstring Stretch Limitations seated   Quad Stretch Left;3 reps;30 seconds   Quad Stretch Limitations prone      Knee/Hip Exercises: Seated   Long Arc [employer];Right;2 sets;10 reps   Long [employer] Limitations cues for proper performance, limited by hamstring tightness      Knee/Hip Exercises: Supine   Quad Sets Right;2 sets;15 reps   Short Arc [employer];Right;15 reps   Bridges 2 sets;15 reps                PT Education - 09/08/16 1535    Education provided Yes   Education Details addition of hamstring stretch HEP in sitting; importane of compliance  with HEP    Person(s) Educated Patient;Spouse   Methods Explanation;Handout   Comprehension Verbalized understanding          PT Short Term Goals - 09/03/16 1503      PT SHORT TERM GOAL #1   Title Patient to be able to ambulate 483ft in 3MWT with LRAD in order to demonstrate improved mobility and function    Baseline 9/7- 346ft with walker    Time 4   Period Weeks   Status On-going     PT SHORT TERM GOAL #2   Title Patient to maintain correct posture at least 75% of the time in order to maintain low levels of pain and improve balance    Baseline 9/7- ongoing, continues to flex trunk with walker    Time 4   Period Weeks   Status On-going     PT SHORT TERM GOAL #3   Title Patient to demonstrate ability to complete TUG in 15 seconds or less with LRAD in order to show improved balance/reduced fall risk    Baseline 9/7- 18   Time 4   Period Weeks   Status On-going     PT SHORT TERM GOAL #4   Title Patient to be able to correctly and consistently perform appropriate HEP, to be updated PRN    Baseline 9/7- "not going too good"; he just doesn't do them. States poor compliance.    Time 4   Period Weeks   Status On-going           PT Long Term Goals - 09/03/16 1506      PT LONG TERM GOAL #1   Title Patient to score at least 4-/5 in all tested muscle groups in R LE and 5/5 in all other tested muscles in order to improve mobilty and gait    Time 8   Period Weeks   Status On-going     PT LONG TERM GOAL #2   Title Patient to demonstrate correct functional mechanics for all functional tasks, including bed mobility and functional lifting, in order to protect lumbar area and reduce fall risk    Time 8   Period Weeks   Status On-going     PT LONG TERM GOAL #3   Title Patient to be able to ambulate at least 636ft during 3MWT with LRAD in order to show improved overall mobility    Baseline 9/7- 374ft    Time 8   Period Weeks   Status On-going     PT LONG TERM  GOAL #4    Title Patient to be able to maintain SLS B LEs at least 30 seconds in order to show improved balance skills and reduced fall risk    Time 8   Period Weeks   Status On-going     PT LONG TERM GOAL #5   Title Patient to be participatory in regular exercise program, at least 20 minutes in duration, at least 4 days per week, in order to maintain functional gains and assist in improving overall health status    Time 8   Period Weeks   Status On-going               Plan - 09/08/16 1536    Clinical Impression Statement Continued with functional exercises, beginning with specific exercises targeting hip extensors and abductors for dynamic function/stability, and then focusing on R quad as well as attempting to identify factors causing L LE burning today. Patient remains non-compliant with HEP, and further education was provided regarding this topic today. He continues to experience R knee buckling occasionally with gait however is able to independently prevent a fall and continue with mobility. Note hip flexor/proximal quad compensation with SAQs today, likely due to weakness throughout R quad/distal quad in general. Attempted to address burning sensation L anterior quad with prone quad stretch as well as sidelying femoral nerve mob today as well; noted moderate tightness L quad, R hamstring; no changes with femoral nerve floss or repeated motions.    Rehab Potential Good   PT Frequency 2x / week   PT Duration 4 weeks   PT Treatment/Interventions ADLs/Self Care Home Management;Cryotherapy;Moist Heat;Electrical Stimulation;Gait training;DME Instruction;Stair training;Functional mobility training;Therapeutic activities;Therapeutic exercise;Balance training;Neuromuscular re-education;Patient/family education;Manual techniques;Passive range of motion;Energy conservation;Taping   PT Next Visit Plan R hamstring stretch/L quad stretch; conintue working R quad, incorporate gait and CKC activities. Consider  speaking to MD regarding knee brace R.    Consulted and Agree with Plan of Care Patient      Patient will benefit from skilled therapeutic intervention in order to improve the following deficits and impairments:  Abnormal gait, Improper body mechanics, Decreased coordination, Decreased mobility, Postural dysfunction, Decreased activity tolerance, Decreased strength, Decreased balance, Difficulty walking, Decreased endurance, Cardiopulmonary status limiting activity  Visit Diagnosis: Muscle weakness (generalized)  Unsteadiness on feet  Difficulty in walking, not elsewhere classified  Other symptoms and signs involving the musculoskeletal system     Problem List Patient Active Problem List   Diagnosis Date Noted  . Morbid obesity ([place]) 07/30/2015  . OSA (obstructive sleep apnea) 01/22/2015  . OA (osteoarthritis) of hip 04/20/2014  . Bladder neck contracture 04/20/2014  . Lumbago 07/05/2012  . Spondylosis with myelopathy, lumbar region 07/05/2012  . Degenerative arthritis of hip 09/22/2011  . Degenerative disc disease, lumbar 09/22/2011  . Acute on chronic renal failure ([place]) 08/07/2011  . BPH (benign prostatic hyperplasia) 08/07/2011  . Prostate cancer ([place]) 08/07/2011  . Hypertension 08/07/2011  . Diabetes mellitus type 2 with complications ([place]) 123456  . Lumbar disc disease 08/07/2011    Deniece Ree PT, DPT [facility] [address] [place], [place], [address] Phone: [phone]   Fax:  [phone]  Name: COULSON RAMNARAIN MRN: [id number] Date of Birth: 25-Sep-1935

## 2016-09-10 ENCOUNTER — Ambulatory Visit (HOSPITAL_COMMUNITY): Payer: Medicare Other

## 2016-09-10 DIAGNOSIS — R29898 Other symptoms and signs involving the musculoskeletal system: Secondary | ICD-10-CM

## 2016-09-10 DIAGNOSIS — M6281 Muscle weakness (generalized): Secondary | ICD-10-CM

## 2016-09-10 DIAGNOSIS — R2689 Other abnormalities of gait and mobility: Secondary | ICD-10-CM

## 2016-09-10 DIAGNOSIS — R2681 Unsteadiness on feet: Secondary | ICD-10-CM

## 2016-09-10 DIAGNOSIS — R262 Difficulty in walking, not elsewhere classified: Secondary | ICD-10-CM

## 2016-09-10 NOTE — Therapy (Signed)
[place], [place], [address] Phone: [phone]   Fax:  [phone]  Physical Therapy Treatment  Patient Details  Name: Manuel [name] MRN: [id number] Date of Birth: 1935/02/05 Referring Provider: Glenna Fellows   Encounter Date: 09/10/2016      PT End of Session - 09/10/16 1525    Visit Number 12   Number of Visits 18   Date for PT Re-Evaluation 10/01/16   Authorization Type [employer] Medicare (G-codes done 10th session)   Authorization Time Period 08/04/16 to 10/04/16   Authorization - Visit Number 12   Authorization - Number of Visits 20   PT Start Time [id number]   PT Stop Time 1603   PT Time Calculation (min) 49 min   Equipment Utilized During Treatment Gait belt   Activity Tolerance Patient tolerated treatment well;Patient limited by fatigue   Behavior During Therapy [facility] for tasks assessed/performed      Past Medical History:  Diagnosis Date  . Arthritis   . Assistance needed for mobility    walker  . Back pain   . Cancer (HCC)    PROSTATE -- RADIATION SEED IMPLANT  . Diabetes mellitus    DX SINCE 1998  . Heart murmur   . Hypertension   . Poor balance   . Sleep apnea     Past Surgical History:  Procedure Laterality Date  . BACK SURGERY    . CHOLECYSTECTOMY    . COLONOSCOPY N/A 03/13/2016   Procedure: COLONOSCOPY;  Surgeon: Rogene Houston, MD;  [facility];  Service: Endoscopy;  Laterality: N/A;  1255  . CYSTOSCOPY WITH URETHRAL DILATATION N/A 04/20/2014   Procedure: CYSTOSCOPY WITH URETHRAL DILATATION AND FOLEY PLACEMENT;  Surgeon: Gearlean Alf, MD;  Location: [place];  Service: Orthopedics;  Laterality: N/A;  . EYE SURGERY     CATARACT BOTH EYES 2014  . Prostate radiation seed implantation    . PROSTATE SURGERY     with seed implants  . TOTAL HIP ARTHROPLASTY Right 04/20/2014   Procedure: RIGHT TOTAL HIP ARTHROPLASTY ANTERIOR APPROACH;  Surgeon: Gearlean Alf, MD;  Location: [place];  Service:  Orthopedics;  Laterality: Right;  . TOTAL HIP ARTHROPLASTY Left 08/14/2015   Procedure: LEFT TOTAL HIP ARTHROPLASTY ANTERIOR APPROACH;  Surgeon: Gaynelle Arabian, MD;  [facility];  Service: Orthopedics;  Laterality: Left;  . TRANSURETHRAL RESECTION OF PROSTATE  2012  . VASECTOMY    . VASECTOMY      There were no vitals filed for this visit.      Subjective Assessment - 09/10/16 1516    Subjective Pt stated he is feeling okay today.  Reports he is being a little more compliant with HEP, does exercises when he thinks about it maybe every other day.   Pertinent History HTN, DM, OSA, BPH, hx of L3-L4 surgery, eye problems with blurred vision, B anterior hips, hx of prostate cancer 10 years ago   Patient Stated Goals get strength back, get walking better and potentially get rid of device    Currently in Pain? No/denies   Pain Score 6   6/10 Lt quad beginning initial bridge, reports decreased pain with increased reps   Pain Location Leg   Pain Orientation Right;Proximal  Rt quad during bridge   Pain Descriptors / Indicators Sore   Pain Type Acute pain   Pain Radiating Towards none   Pain Onset More than a month ago   Pain Frequency Intermittent  Aggravating Factors  certain position, weight bearing   Pain Relieving Factors getting weight off of hip   Effect of Pain on Daily Activities none              [facility] Adult PT Treatment/Exercise - 09/10/16 0001      Knee/Hip Exercises: Stretches   Active Hamstring Stretch 3 reps;30 seconds;Both   Active Hamstring Stretch Limitations supine passive   Quad Stretch Both;3 reps;30 seconds     Knee/Hip Exercises: Standing   Forward Lunges Both;15 reps   Forward Lunges Limitations 6 in step wtih TKE tactile cueing on distal quad    Forward Step Up Right;2 sets;5 reps;Hand Hold: 1;Step Height: 2";Hand Hold: 2;Step Height: 4"     Knee/Hip Exercises: Seated   Long Arc Quad AAROM;Right;2 sets;10 reps   Long [employer] Limitations cues for  proper performance, limited by hamstring tightness    Sit to [employer] 10 reps;without UE support  on [employer]     Knee/Hip Exercises: Supine   Quad Sets Right;2 sets;15 reps   Short Arc [employer];Right;15 reps   Bridges 2 sets;15 reps             PT Short Term Goals - 09/03/16 1503      PT SHORT TERM GOAL #1   Title Patient to be able to ambulate 410ft in 3MWT with LRAD in order to demonstrate improved mobility and function    Baseline 9/7- 332ft with walker    Time 4   Period Weeks   Status On-going     PT SHORT TERM GOAL #2   Title Patient to maintain correct posture at least 75% of the time in order to maintain low levels of pain and improve balance    Baseline 9/7- ongoing, continues to flex trunk with walker    Time 4   Period Weeks   Status On-going     PT SHORT TERM GOAL #3   Title Patient to demonstrate ability to complete TUG in 15 seconds or less with LRAD in order to show improved balance/reduced fall risk    Baseline 9/7- 18   Time 4   Period Weeks   Status On-going     PT SHORT TERM GOAL #4   Title Patient to be able to correctly and consistently perform appropriate HEP, to be updated PRN    Baseline 9/7- "not going too good"; he just doesn't do them. States poor compliance.    Time 4   Period Weeks   Status On-going           PT Long Term Goals - 09/03/16 1506      PT LONG TERM GOAL #1   Title Patient to score at least 4-/5 in all tested muscle groups in R LE and 5/5 in all other tested muscles in order to improve mobilty and gait    Time 8   Period Weeks   Status On-going     PT LONG TERM GOAL #2   Title Patient to demonstrate correct functional mechanics for all functional tasks, including bed mobility and functional lifting, in order to protect lumbar area and reduce fall risk    Time 8   Period Weeks   Status On-going     PT LONG TERM GOAL #3   Title Patient to be able to ambulate at least 615ft during 3MWT with LRAD in order to show  improved overall mobility    Baseline 9/7- 344ft    Time 8  Period Weeks   Status On-going     PT LONG TERM GOAL #4   Title Patient to be able to maintain SLS B LEs at least 30 seconds in order to show improved balance skills and reduced fall risk    Time 8   Period Weeks   Status On-going     PT LONG TERM GOAL #5   Title Patient to be participatory in regular exercise program, at least 20 minutes in duration, at least 4 days per week, in order to maintain functional gains and assist in improving overall health status    Time 8   Period Weeks   Status On-going               Plan - 09/10/16 1856    Clinical Impression Statement Continued session focus on functional exercises with primary focus on proximal musculature including hip musculature and quadriceps.  Pt c/o Lt quad pain with bridges and upon relaxation following hamstring stretch, pain free with all other exercises today, therapist monitored pain through session.  Continued with stretches to address proximal musclature tightness with reports of relief following.  Pt does continue to demonstrate significant weakness hip and distal quad musculature with compensation noted.  EOS pt limited by fatigue, no reports of pain.     Rehab Potential Good   PT Frequency 2x / week   PT Duration 4 weeks   PT Treatment/Interventions ADLs/Self Care Home Management;Cryotherapy;Moist Heat;Electrical Stimulation;Gait training;DME Instruction;Stair training;Functional mobility training;Therapeutic activities;Therapeutic exercise;Balance training;Neuromuscular re-education;Patient/family education;Manual techniques;Passive range of motion;Energy conservation;Taping   PT Next Visit Plan R hamstring stretch/L quad stretch; conintue working R quad, incorporate gait and CKC activities. Consider speaking to MD regarding knee brace R.       Patient will benefit from skilled therapeutic intervention in order to improve the following deficits and  impairments:  Abnormal gait, Improper body mechanics, Decreased coordination, Decreased mobility, Postural dysfunction, Decreased activity tolerance, Decreased strength, Decreased balance, Difficulty walking, Decreased endurance, Cardiopulmonary status limiting activity  Visit Diagnosis: Muscle weakness (generalized)  Unsteadiness on feet  Difficulty in walking, not elsewhere classified  Other symptoms and signs involving the musculoskeletal system  Poor balance  Leg weakness, bilateral     Problem List Patient Active Problem List   Diagnosis Date Noted  . Morbid obesity ([place]) 07/30/2015  . OSA (obstructive sleep apnea) 01/22/2015  . OA (osteoarthritis) of hip 04/20/2014  . Bladder neck contracture 04/20/2014  . Lumbago 07/05/2012  . Spondylosis with myelopathy, lumbar region 07/05/2012  . Degenerative arthritis of hip 09/22/2011  . Degenerative disc disease, lumbar 09/22/2011  . Acute on chronic renal failure ([place]) 08/07/2011  . BPH (benign prostatic hyperplasia) 08/07/2011  . Prostate cancer ([place]) 08/07/2011  . Hypertension 08/07/2011  . Diabetes mellitus type 2 with complications ([place]) 123456  . Lumbar disc disease 08/07/2011   Ihor Austin, [place]; [place]  Aldona Lento 09/10/2016, 7:05 PM  [place], [place], [address] Phone: [phone]   Fax:  [phone]  Name: Manuel [name] MRN: [id number] Date of Birth: 17-Nov-1935

## 2016-09-15 ENCOUNTER — Ambulatory Visit (HOSPITAL_COMMUNITY): Payer: Medicare Other | Admitting: Physical Therapy

## 2016-09-15 DIAGNOSIS — M6281 Muscle weakness (generalized): Secondary | ICD-10-CM

## 2016-09-15 DIAGNOSIS — R29898 Other symptoms and signs involving the musculoskeletal system: Secondary | ICD-10-CM

## 2016-09-15 DIAGNOSIS — R262 Difficulty in walking, not elsewhere classified: Secondary | ICD-10-CM

## 2016-09-15 DIAGNOSIS — R2681 Unsteadiness on feet: Secondary | ICD-10-CM

## 2016-09-15 NOTE — Therapy (Signed)
[place], [place], [address] Phone: [phone]   Fax:  [phone]  Physical Therapy Treatment  Patient Details  Name: Manuel [name] MRN: [id number] Date of Birth: 1935/05/15 Referring Provider: Glenna Fellows   Encounter Date: 09/15/2016      PT End of Session - 09/15/16 1444    Visit Number 13   Number of Visits 18   Date for PT Re-Evaluation 10/01/16   Authorization Type [employer] Medicare (G-codes done 10th session)   Authorization Time Period 08/04/16 to 10/04/16   Authorization - Visit Number 13   Authorization - Number of Visits 20   PT Start Time 1350   PT Stop Time 1430   PT Time Calculation (min) 40 min   Activity Tolerance Patient tolerated treatment well;Patient limited by fatigue   Behavior During Therapy [facility] for tasks assessed/performed      Past Medical History:  Diagnosis Date  . Arthritis   . Assistance needed for mobility    walker  . Back pain   . Cancer (HCC)    PROSTATE -- RADIATION SEED IMPLANT  . Diabetes mellitus    DX SINCE 1998  . Heart murmur   . Hypertension   . Poor balance   . Sleep apnea     Past Surgical History:  Procedure Laterality Date  . BACK SURGERY    . CHOLECYSTECTOMY    . COLONOSCOPY N/A 03/13/2016   Procedure: COLONOSCOPY;  Surgeon: Rogene Houston, MD;  [facility];  Service: Endoscopy;  Laterality: N/A;  1255  . CYSTOSCOPY WITH URETHRAL DILATATION N/A 04/20/2014   Procedure: CYSTOSCOPY WITH URETHRAL DILATATION AND FOLEY PLACEMENT;  Surgeon: Gearlean Alf, MD;  Location: [place];  Service: Orthopedics;  Laterality: N/A;  . EYE SURGERY     CATARACT BOTH EYES 2014  . Prostate radiation seed implantation    . PROSTATE SURGERY     with seed implants  . TOTAL HIP ARTHROPLASTY Right 04/20/2014   Procedure: RIGHT TOTAL HIP ARTHROPLASTY ANTERIOR APPROACH;  Surgeon: Gearlean Alf, MD;  Location: [place];  Service: Orthopedics;  Laterality: Right;  . TOTAL HIP  ARTHROPLASTY Left 08/14/2015   Procedure: LEFT TOTAL HIP ARTHROPLASTY ANTERIOR APPROACH;  Surgeon: Gaynelle Arabian, MD;  [facility];  Service: Orthopedics;  Laterality: Left;  . TRANSURETHRAL RESECTION OF PROSTATE  2012  . VASECTOMY    . VASECTOMY      There were no vitals filed for this visit.      Subjective Assessment - 09/15/16 1353    Subjective Patient arrives he is feeling well today, he is feeling well and wants to try walking some more. His knee has not been buckling but it still feels very weak. His L LE is giving him quite a bit of pain and he is having radiating symptoms going down the front of his L leg. He has not talked to his MD yet but he plans to next time he goes in.    Pertinent History HTN, DM, OSA, BPH, hx of L3-L4 surgery, eye problems with blurred vision, B anterior hips, hx of prostate cancer 10 years ago   Currently in Pain? No/denies                         [facility] Adult PT Treatment/Exercise - 09/15/16 0001      Knee/Hip Exercises: Standing   Gait Training 963ft with walker, noted increased R knee  hyperextension and increased R foot drop as fatigue increases      Knee/Hip Exercises: Seated   Long Arc Quad AAROM;Right;1 set;15 reps     Knee/Hip Exercises: Supine   Short Arc [employer];Right;15 reps                PT Education - 09/15/16 1443    Education provided Yes   Education Details importance of compliance with HEP; mechanism of and potential benefits of KAFO and referral being sent to MD today; safety with mobiltiy in general   Person(s) Educated Patient   Methods Explanation   Comprehension Verbalized understanding          PT Short Term Goals - 09/03/16 1503      PT SHORT TERM GOAL #1   Title Patient to be able to ambulate 465ft in 3MWT with LRAD in order to demonstrate improved mobility and function    Baseline 9/7- 338ft with walker    Time 4   Period Weeks   Status On-going     PT SHORT TERM GOAL #2    Title Patient to maintain correct posture at least 75% of the time in order to maintain low levels of pain and improve balance    Baseline 9/7- ongoing, continues to flex trunk with walker    Time 4   Period Weeks   Status On-going     PT SHORT TERM GOAL #3   Title Patient to demonstrate ability to complete TUG in 15 seconds or less with LRAD in order to show improved balance/reduced fall risk    Baseline 9/7- 18   Time 4   Period Weeks   Status On-going     PT SHORT TERM GOAL #4   Title Patient to be able to correctly and consistently perform appropriate HEP, to be updated PRN    Baseline 9/7- "not going too good"; he just doesn't do them. States poor compliance.    Time 4   Period Weeks   Status On-going           PT Long Term Goals - 09/03/16 1506      PT LONG TERM GOAL #1   Title Patient to score at least 4-/5 in all tested muscle groups in R LE and 5/5 in all other tested muscles in order to improve mobilty and gait    Time 8   Period Weeks   Status On-going     PT LONG TERM GOAL #2   Title Patient to demonstrate correct functional mechanics for all functional tasks, including bed mobility and functional lifting, in order to protect lumbar area and reduce fall risk    Time 8   Period Weeks   Status On-going     PT LONG TERM GOAL #3   Title Patient to be able to ambulate at least 664ft during 3MWT with LRAD in order to show improved overall mobility    Baseline 9/7- 360ft    Time 8   Period Weeks   Status On-going     PT LONG TERM GOAL #4   Title Patient to be able to maintain SLS B LEs at least 30 seconds in order to show improved balance skills and reduced fall risk    Time 8   Period Weeks   Status On-going     PT LONG TERM GOAL #5   Title Patient to be participatory in regular exercise program, at least 20 minutes in duration, at least 4 days per week,  in order to maintain functional gains and assist in improving overall health status    Time 8    Period Weeks   Status On-going               Plan - 09/15/16 1445    Clinical Impression Statement Patient arrives today requesting to work on walking and endurance; ambulated approximately 927ft with walker and supervision/min guard, noting increased R knee hyperextension and increased tendency for R foot slap as ankle dorsiflexors fatigue with extended activity. Due to ongoing weakness/tendency to buckle of R knee, as well as poor endurance of dorsiflexors, am recommending KAFO at this time and sent formal referral to MD. Educated patient extensively regarding KAFO mechanism/potential benefits as well as importance of compliance with HEP and general safety with mobility. Worked on R quad strength today, ended session on Nustep (not included in billing).   Rehab Potential Good   PT Frequency 2x / week   PT Duration 4 weeks   PT Treatment/Interventions ADLs/Self Care Home Management;Cryotherapy;Moist Heat;Electrical Stimulation;Gait training;DME Instruction;Stair training;Functional mobility training;Therapeutic activities;Therapeutic exercise;Balance training;Neuromuscular re-education;Patient/family education;Manual techniques;Passive range of motion;Energy conservation;Taping   PT Next Visit Plan R hamstring stretch/L quad stretch; conintue working R quad, incorporate gait and CKC activities. F/U on KAFO.    Consulted and Agree with Plan of Care Patient      Patient will benefit from skilled therapeutic intervention in order to improve the following deficits and impairments:  Abnormal gait, Improper body mechanics, Decreased coordination, Decreased mobility, Postural dysfunction, Decreased activity tolerance, Decreased strength, Decreased balance, Difficulty walking, Decreased endurance, Cardiopulmonary status limiting activity  Visit Diagnosis: Muscle weakness (generalized)  Unsteadiness on feet  Difficulty in walking, not elsewhere classified  Other symptoms and signs involving  the musculoskeletal system     Problem List Patient Active Problem List   Diagnosis Date Noted  . Morbid obesity ([place]) 07/30/2015  . OSA (obstructive sleep apnea) 01/22/2015  . OA (osteoarthritis) of hip 04/20/2014  . Bladder neck contracture 04/20/2014  . Lumbago 07/05/2012  . Spondylosis with myelopathy, lumbar region 07/05/2012  . Degenerative arthritis of hip 09/22/2011  . Degenerative disc disease, lumbar 09/22/2011  . Acute on chronic renal failure ([place]) 08/07/2011  . BPH (benign prostatic hyperplasia) 08/07/2011  . Prostate cancer ([place]) 08/07/2011  . Hypertension 08/07/2011  . Diabetes mellitus type 2 with complications ([place]) 123456  . Lumbar disc disease 08/07/2011    Deniece Ree PT, DPT [facility] [address] [place], [place], [address] Phone: [phone]   Fax:  [phone]  Name: Manuel [name] MRN: [id number] Date of Birth: 01-Apr-1935

## 2016-09-17 ENCOUNTER — Ambulatory Visit (HOSPITAL_COMMUNITY): Payer: Medicare Other | Admitting: Physical Therapy

## 2016-09-17 DIAGNOSIS — R2681 Unsteadiness on feet: Secondary | ICD-10-CM

## 2016-09-17 DIAGNOSIS — R262 Difficulty in walking, not elsewhere classified: Secondary | ICD-10-CM

## 2016-09-17 DIAGNOSIS — M6281 Muscle weakness (generalized): Secondary | ICD-10-CM

## 2016-09-17 DIAGNOSIS — R29898 Other symptoms and signs involving the musculoskeletal system: Secondary | ICD-10-CM

## 2016-09-17 NOTE — Therapy (Signed)
[facility] [place], [place], [address] Phone: [phone]   Fax:  [phone]  Physical Therapy Treatment  Patient Details  Name: Manuel [name] MRN: [id number] Date of Birth: Dec 27, 1935 Referring Provider: Glenna Fellows   Encounter Date: 09/17/2016      PT End of Session - 09/17/16 1439    Visit Number 14   Number of Visits 18   Date for PT Re-Evaluation 10/01/16   Authorization Type [employer] Medicare (G-codes done 10th session)   Authorization Time Period 08/04/16 to 10/04/16   Authorization - Visit Number 14   Authorization - Number of Visits 20   PT Start Time [id number]   PT Stop Time 1428   PT Time Calculation (min) 41 min   Equipment Utilized During Treatment Gait belt   Activity Tolerance Patient tolerated treatment well;Patient limited by fatigue   Behavior During Therapy [facility] for tasks assessed/performed      Past Medical History:  Diagnosis Date  . Arthritis   . Assistance needed for mobility    walker  . Back pain   . Cancer (HCC)    PROSTATE -- RADIATION SEED IMPLANT  . Diabetes mellitus    DX SINCE 1998  . Heart murmur   . Hypertension   . Poor balance   . Sleep apnea     Past Surgical History:  Procedure Laterality Date  . BACK SURGERY    . CHOLECYSTECTOMY    . COLONOSCOPY N/A 03/13/2016   Procedure: COLONOSCOPY;  Surgeon: Rogene Houston, MD;  [facility];  Service: Endoscopy;  Laterality: N/A;  1255  . CYSTOSCOPY WITH URETHRAL DILATATION N/A 04/20/2014   Procedure: CYSTOSCOPY WITH URETHRAL DILATATION AND FOLEY PLACEMENT;  Surgeon: Gearlean Alf, MD;  Location: [facility];  Service: Orthopedics;  Laterality: N/A;  . EYE SURGERY     CATARACT BOTH EYES 2014  . Prostate radiation seed implantation    . PROSTATE SURGERY     with seed implants  . TOTAL HIP ARTHROPLASTY Right 04/20/2014   Procedure: RIGHT TOTAL HIP ARTHROPLASTY ANTERIOR APPROACH;  Surgeon: Gearlean Alf, MD;  Location: [facility];  Service:  Orthopedics;  Laterality: Right;  . TOTAL HIP ARTHROPLASTY Left 08/14/2015   Procedure: LEFT TOTAL HIP ARTHROPLASTY ANTERIOR APPROACH;  Surgeon: Gaynelle Arabian, MD;  [facility];  Service: Orthopedics;  Laterality: Left;  . TRANSURETHRAL RESECTION OF PROSTATE  2012  . VASECTOMY    . VASECTOMY      There were no vitals filed for this visit.      Subjective Assessment - 09/17/16 1349    Subjective Patient arrives stating he has been feeling well, his left leg is still continuing to cause him problems especially when he is walking and standing and moves the wrong way, or when he is sitting down.    Pertinent History HTN, DM, OSA, BPH, hx of L3-L4 surgery, eye problems with blurred vision, B anterior hips, hx of prostate cancer 10 years ago   Currently in Pain? Yes   Pain Score 7    Pain Location Other (Comment)  lower rib cage    Pain Orientation Right;Left   Pain Descriptors / Indicators Sharp   Pain Type Acute pain   Pain Radiating Towards none    Pain Onset More than a month ago   Pain Frequency Intermittent   Aggravating Factors  not sure, intermittent and sporadic    Pain Relieving Factors not sure    Effect  of Pain on Daily Activities none                          [facility] Adult PT Treatment/Exercise - 09/17/16 0001      Knee/Hip Exercises: Standing   Gait Training 644ft with walker, noted increased R knee hyperextension and increased R foot drop as fatigue increases   fatigue noted     Knee/Hip Exercises: Seated   Long Arc Quad AAROM;Right;1 set;15 reps     Knee/Hip Exercises: Supine   Short Arc [employer];Right;15 reps;2 sets   Bridges 2 sets;20 reps     supine hip ABD with red TB 1x15 each            PT Education - 09/17/16 1438    Education provided Yes   Education Details education regarding KAFO being signed by MD, next steps in process, mechanics and use of KAFO, importance of ongoing muscle strengthening and physical training  even with KAFO    Person(s) Educated Patient;Spouse   Methods Explanation   Comprehension Verbalized understanding          PT Short Term Goals - 09/03/16 1503      PT SHORT TERM GOAL #1   Title Patient to be able to ambulate 472ft in 3MWT with LRAD in order to demonstrate improved mobility and function    Baseline 9/7- 379ft with walker    Time 4   Period Weeks   Status On-going     PT SHORT TERM GOAL #2   Title Patient to maintain correct posture at least 75% of the time in order to maintain low levels of pain and improve balance    Baseline 9/7- ongoing, continues to flex trunk with walker    Time 4   Period Weeks   Status On-going     PT SHORT TERM GOAL #3   Title Patient to demonstrate ability to complete TUG in 15 seconds or less with LRAD in order to show improved balance/reduced fall risk    Baseline 9/7- 18   Time 4   Period Weeks   Status On-going     PT SHORT TERM GOAL #4   Title Patient to be able to correctly and consistently perform appropriate HEP, to be updated PRN    Baseline 9/7- "not going too good"; he just doesn't do them. States poor compliance.    Time 4   Period Weeks   Status On-going           PT Long Term Goals - 09/03/16 1506      PT LONG TERM GOAL #1   Title Patient to score at least 4-/5 in all tested muscle groups in R LE and 5/5 in all other tested muscles in order to improve mobilty and gait    Time 8   Period Weeks   Status On-going     PT LONG TERM GOAL #2   Title Patient to demonstrate correct functional mechanics for all functional tasks, including bed mobility and functional lifting, in order to protect lumbar area and reduce fall risk    Time 8   Period Weeks   Status On-going     PT LONG TERM GOAL #3   Title Patient to be able to ambulate at least 620ft during 3MWT with LRAD in order to show improved overall mobility    Baseline 9/7- 358ft    Time 8   Period Weeks   Status On-going  PT LONG TERM GOAL #4    Title Patient to be able to maintain SLS B LEs at least 30 seconds in order to show improved balance skills and reduced fall risk    Time 8   Period Weeks   Status On-going     PT LONG TERM GOAL #5   Title Patient to be participatory in regular exercise program, at least 20 minutes in duration, at least 4 days per week, in order to maintain functional gains and assist in improving overall health status    Time 8   Period Weeks   Status On-going               Plan - 09/17/16 1440    Clinical Impression Statement Educated patient and wife regarding return of signed KAFO order from MD as well as implications of brace use and importance of regular exercise and activity of involved LE even with brace to prevent further weakening of structures, also mechanics of KAFO in general. Otherwise continued with functional exercises today, during which patient displays ongoing functional weakness as well as limitation from L hip pain with some activities. Continued with endurance walking, continuing to note poor endurance of R ankle dorsiflexors and R LE weakness in general. Patient continues to have poor compliance with HEP at this time, and rehab staff has only slowly been able to progress to more CKC exercises with patient at this time.    Rehab Potential Good   PT Frequency 2x / week   PT Duration 4 weeks   PT Treatment/Interventions ADLs/Self Care Home Management;Cryotherapy;Moist Heat;Electrical Stimulation;Gait training;DME Instruction;Stair training;Functional mobility training;Therapeutic activities;Therapeutic exercise;Balance training;Neuromuscular re-education;Patient/family education;Manual techniques;Passive range of motion;Energy conservation;Taping   PT Next Visit Plan R hamstring stretch/L quad stretch; conintue working R quad, incorporate gait and CKC activities. F/U on KAFO.   Consulted and Agree with Plan of Care Patient      Patient will benefit from skilled therapeutic  intervention in order to improve the following deficits and impairments:  Abnormal gait, Improper body mechanics, Decreased coordination, Decreased mobility, Postural dysfunction, Decreased activity tolerance, Decreased strength, Decreased balance, Difficulty walking, Decreased endurance, Cardiopulmonary status limiting activity  Visit Diagnosis: Muscle weakness (generalized)  Unsteadiness on feet  Difficulty in walking, not elsewhere classified  Other symptoms and signs involving the musculoskeletal system     Problem List Patient Active Problem List   Diagnosis Date Noted  . Morbid obesity ([place]) 07/30/2015  . OSA (obstructive sleep apnea) 01/22/2015  . OA (osteoarthritis) of hip 04/20/2014  . Bladder neck contracture 04/20/2014  . Lumbago 07/05/2012  . Spondylosis with myelopathy, lumbar region 07/05/2012  . Degenerative arthritis of hip 09/22/2011  . Degenerative disc disease, lumbar 09/22/2011  . Acute on chronic renal failure ([place]) 08/07/2011  . BPH (benign prostatic hyperplasia) 08/07/2011  . Prostate cancer ([place]) 08/07/2011  . Hypertension 08/07/2011  . Diabetes mellitus type 2 with complications ([place]) 123456  . Lumbar disc disease 08/07/2011    Deniece Ree PT, DPT [facility] [address] [place], [place], [address] Phone: [phone]   Fax:  [phone]  Name: Manuel [name] MRN: [id number] Date of Birth: 1935/07/24

## 2016-09-21 ENCOUNTER — Ambulatory Visit (HOSPITAL_COMMUNITY): Payer: Medicare Other | Admitting: Physical Therapy

## 2016-09-21 DIAGNOSIS — R29898 Other symptoms and signs involving the musculoskeletal system: Secondary | ICD-10-CM

## 2016-09-21 DIAGNOSIS — M6281 Muscle weakness (generalized): Secondary | ICD-10-CM

## 2016-09-21 DIAGNOSIS — R262 Difficulty in walking, not elsewhere classified: Secondary | ICD-10-CM

## 2016-09-21 DIAGNOSIS — R2681 Unsteadiness on feet: Secondary | ICD-10-CM

## 2016-09-21 NOTE — Therapy (Signed)
[facility] [place], [place], [address] Phone: [phone]   Fax:  [phone]  Physical Therapy Treatment  Patient Details  Name: Manuel [name] MRN: [id number] Date of Birth: 05/04/1935 Referring Provider: Glenna Fellows   Encounter Date: 09/21/2016      PT End of Session - 09/21/16 1351    Visit Number 15   Number of Visits 18   Date for PT Re-Evaluation 10/01/16   Authorization Type [employer] Medicare (G-codes done 10th session)   Authorization Time Period 08/04/16 to 10/04/16   Authorization - Visit Number 15   Authorization - Number of Visits 20   PT Start Time 1301   PT Stop Time 1343   PT Time Calculation (min) 42 min   Equipment Utilized During Treatment Gait belt   Activity Tolerance Patient tolerated treatment well;Patient limited by fatigue   Behavior During Therapy [facility] for tasks assessed/performed      Past Medical History:  Diagnosis Date  . Arthritis   . Assistance needed for mobility    walker  . Back pain   . Cancer (HCC)    PROSTATE -- RADIATION SEED IMPLANT  . Diabetes mellitus    DX SINCE 1998  . Heart murmur   . Hypertension   . Poor balance   . Sleep apnea     Past Surgical History:  Procedure Laterality Date  . BACK SURGERY    . CHOLECYSTECTOMY    . COLONOSCOPY N/A 03/13/2016   Procedure: COLONOSCOPY;  Surgeon: Rogene Houston, MD;  [facility];  Service: Endoscopy;  Laterality: N/A;  1255  . CYSTOSCOPY WITH URETHRAL DILATATION N/A 04/20/2014   Procedure: CYSTOSCOPY WITH URETHRAL DILATATION AND FOLEY PLACEMENT;  Surgeon: Gearlean Alf, MD;  Location: [facility];  Service: Orthopedics;  Laterality: N/A;  . EYE SURGERY     CATARACT BOTH EYES 2014  . Prostate radiation seed implantation    . PROSTATE SURGERY     with seed implants  . TOTAL HIP ARTHROPLASTY Right 04/20/2014   Procedure: RIGHT TOTAL HIP ARTHROPLASTY ANTERIOR APPROACH;  Surgeon: Gearlean Alf, MD;  Location: [facility];  Service:  Orthopedics;  Laterality: Right;  . TOTAL HIP ARTHROPLASTY Left 08/14/2015   Procedure: LEFT TOTAL HIP ARTHROPLASTY ANTERIOR APPROACH;  Surgeon: Gaynelle Arabian, MD;  [facility];  Service: Orthopedics;  Laterality: Left;  . TRANSURETHRAL RESECTION OF PROSTATE  2012  . VASECTOMY    . VASECTOMY      There were no vitals filed for this visit.      Subjective Assessment - 09/21/16 1305    Subjective Patient arrives stating he is feeling "beat up", he fell in the shower this morning when his shower chair tipped over while he was reaching for a grab bar. His wife was able to help him get up, and he does not feel like he broke anything. Nothing else major going on, but they did call an orthotics company and he has an appointment on Thursday.    Pertinent History HTN, DM, OSA, BPH, hx of L3-L4 surgery, eye problems with blurred vision, B anterior hips, hx of prostate cancer 10 years ago   Patient Stated Goals get strength back, get walking better and potentially get rid of device    Currently in Pain? No/denies                         [facility] Adult PT Treatment/Exercise - 09/21/16  0001      Knee/Hip Exercises: Stretches   Active Hamstring Stretch 3 reps;30 seconds;Right     Knee/Hip Exercises: Standing   Gait Training 659ft with walker, noted increased R knee hyperextension and increased R foot drop as fatigue increases      Knee/Hip Exercises: Seated   Long Arc Quad AAROM;Right;1 set;15 reps     Knee/Hip Exercises: Supine   Short Arc [employer];Right;15 reps;2 sets   Bridges Both;2 sets;10 reps   Bridges Limitations staggered feet      Knee/Hip Exercises: Prone   Hamstring Curl 15 reps   Hamstring Curl Limitations 2#   Hip Extension 10 reps   Hip Extension Limitations knees bent                 PT Education - 09/21/16 1351    Education provided Yes   Education Details encouraged improving compliance with HEP, general process of getting KAFO     Person(s) Educated Patient   Methods Explanation   Comprehension Verbalized understanding          PT Short Term Goals - 09/03/16 1503      PT SHORT TERM GOAL #1   Title Patient to be able to ambulate 43ft in 3MWT with LRAD in order to demonstrate improved mobility and function    Baseline 9/7- 336ft with walker    Time 4   Period Weeks   Status On-going     PT SHORT TERM GOAL #2   Title Patient to maintain correct posture at least 75% of the time in order to maintain low levels of pain and improve balance    Baseline 9/7- ongoing, continues to flex trunk with walker    Time 4   Period Weeks   Status On-going     PT SHORT TERM GOAL #3   Title Patient to demonstrate ability to complete TUG in 15 seconds or less with LRAD in order to show improved balance/reduced fall risk    Baseline 9/7- 18   Time 4   Period Weeks   Status On-going     PT SHORT TERM GOAL #4   Title Patient to be able to correctly and consistently perform appropriate HEP, to be updated PRN    Baseline 9/7- "not going too good"; he just doesn't do them. States poor compliance.    Time 4   Period Weeks   Status On-going           PT Long Term Goals - 09/03/16 1506      PT LONG TERM GOAL #1   Title Patient to score at least 4-/5 in all tested muscle groups in R LE and 5/5 in all other tested muscles in order to improve mobilty and gait    Time 8   Period Weeks   Status On-going     PT LONG TERM GOAL #2   Title Patient to demonstrate correct functional mechanics for all functional tasks, including bed mobility and functional lifting, in order to protect lumbar area and reduce fall risk    Time 8   Period Weeks   Status On-going     PT LONG TERM GOAL #3   Title Patient to be able to ambulate at least 633ft during 3MWT with LRAD in order to show improved overall mobility    Baseline 9/7- 331ft    Time 8   Period Weeks   Status On-going     PT LONG TERM GOAL #4   Title  Patient to be able to  maintain SLS B LEs at least 30 seconds in order to show improved balance skills and reduced fall risk    Time 8   Period Weeks   Status On-going     PT LONG TERM GOAL #5   Title Patient to be participatory in regular exercise program, at least 20 minutes in duration, at least 4 days per week, in order to maintain functional gains and assist in improving overall health status    Time 8   Period Weeks   Status On-going               Plan - 09/21/16 1352    Clinical Impression Statement Patient arrives today stating that he had a fall this morning when his shower chair tipped over; he feels relatively OK besides feeling a bit bruised on his R LE and was able to tolerate palpation, did not show significant edema or bruising that might indicate possible fracture. However, did note some increased edema R knee today. Continued with functional strengthening however patient limited today due to combination of tenderness R knee and severe L hip pains, which he reports are his most limiting factor right now. Unable to trial tall kneeling/quadruped today due to discomfort in R knee with these tasks today. Cues required for safety with prone to sit today. Patient reports improving compliance with HEP and was encouraged to continue/advance this as well.    Rehab Potential Good   PT Frequency 2x / week   PT Duration 4 weeks   PT Treatment/Interventions ADLs/Self Care Home Management;Cryotherapy;Moist Heat;Electrical Stimulation;Gait training;DME Instruction;Stair training;Functional mobility training;Therapeutic activities;Therapeutic exercise;Balance training;Neuromuscular re-education;Patient/family education;Manual techniques;Passive range of motion;Energy conservation;Taping   PT Next Visit Plan R hamstring stretch/L quad stretch; conintue working R quad, incorporate gait and CKC activities. F/U on KAFO. Encourage HEP compliance.    Consulted and Agree with Plan of Care Patient      Patient will  benefit from skilled therapeutic intervention in order to improve the following deficits and impairments:  Abnormal gait, Improper body mechanics, Decreased coordination, Decreased mobility, Postural dysfunction, Decreased activity tolerance, Decreased strength, Decreased balance, Difficulty walking, Decreased endurance, Cardiopulmonary status limiting activity  Visit Diagnosis: Muscle weakness (generalized)  Unsteadiness on feet  Difficulty in walking, not elsewhere classified  Other symptoms and signs involving the musculoskeletal system     Problem List Patient Active Problem List   Diagnosis Date Noted  . Morbid obesity ([place]) 07/30/2015  . OSA (obstructive sleep apnea) 01/22/2015  . OA (osteoarthritis) of hip 04/20/2014  . Bladder neck contracture 04/20/2014  . Lumbago 07/05/2012  . Spondylosis with myelopathy, lumbar region 07/05/2012  . Degenerative arthritis of hip 09/22/2011  . Degenerative disc disease, lumbar 09/22/2011  . Acute on chronic renal failure ([place]) 08/07/2011  . BPH (benign prostatic hyperplasia) 08/07/2011  . Prostate cancer ([place]) 08/07/2011  . Hypertension 08/07/2011  . Diabetes mellitus type 2 with complications ([place]) 123456  . Lumbar disc disease 08/07/2011    Deniece Ree PT, DPT [facility] [address] [place], [place], [address] Phone: [phone]   Fax:  [phone]  Name: BAYLIE STRUMPF MRN: [id number] Date of Birth: 09-Apr-1935

## 2016-09-22 ENCOUNTER — Ambulatory Visit: Payer: Medicare Other | Admitting: Pulmonary Disease

## 2016-09-24 ENCOUNTER — Ambulatory Visit (HOSPITAL_COMMUNITY): Payer: Medicare Other | Admitting: Physical Therapy

## 2016-09-24 DIAGNOSIS — R29898 Other symptoms and signs involving the musculoskeletal system: Secondary | ICD-10-CM

## 2016-09-24 DIAGNOSIS — R2681 Unsteadiness on feet: Secondary | ICD-10-CM

## 2016-09-24 DIAGNOSIS — M6281 Muscle weakness (generalized): Secondary | ICD-10-CM

## 2016-09-24 DIAGNOSIS — R262 Difficulty in walking, not elsewhere classified: Secondary | ICD-10-CM

## 2016-09-24 NOTE — Therapy (Signed)
[place], [place], [address] Phone: [phone]   Fax:  [phone]  Physical Therapy Treatment  Patient Details  Name: Manuel [name] MRN: [id number] Date of Birth: Apr 07, 1935 Referring Provider: Glenna Fellows   Encounter Date: 09/24/2016      PT End of Session - 09/24/16 1648    Visit Number 16   Number of Visits 18   Date for PT Re-Evaluation 10/01/16   Authorization Type [employer] Medicare (G-codes done 10th session)   Authorization Time Period 08/04/16 to 10/04/16   Authorization - Visit Number 16   Authorization - Number of Visits 20   PT Start Time 1602   PT Stop Time 1643   PT Time Calculation (min) 41 min   Activity Tolerance Patient tolerated treatment well;Patient limited by fatigue   Behavior During Therapy [facility] for tasks assessed/performed      Past Medical History:  Diagnosis Date  . Arthritis   . Assistance needed for mobility    walker  . Back pain   . Cancer (HCC)    PROSTATE -- RADIATION SEED IMPLANT  . Diabetes mellitus    DX SINCE 1998  . Heart murmur   . Hypertension   . Poor balance   . Sleep apnea     Past Surgical History:  Procedure Laterality Date  . BACK SURGERY    . CHOLECYSTECTOMY    . COLONOSCOPY N/A 03/13/2016   Procedure: COLONOSCOPY;  Surgeon: Rogene Houston, MD;  [facility];  Service: Endoscopy;  Laterality: N/A;  1255  . CYSTOSCOPY WITH URETHRAL DILATATION N/A 04/20/2014   Procedure: CYSTOSCOPY WITH URETHRAL DILATATION AND FOLEY PLACEMENT;  Surgeon: Gearlean Alf, MD;  Location: [facility];  Service: Orthopedics;  Laterality: N/A;  . EYE SURGERY     CATARACT BOTH EYES 2014  . Prostate radiation seed implantation    . PROSTATE SURGERY     with seed implants  . TOTAL HIP ARTHROPLASTY Right 04/20/2014   Procedure: RIGHT TOTAL HIP ARTHROPLASTY ANTERIOR APPROACH;  Surgeon: Gearlean Alf, MD;  Location: [place];  Service: Orthopedics;  Laterality: Right;  . TOTAL HIP  ARTHROPLASTY Left 08/14/2015   Procedure: LEFT TOTAL HIP ARTHROPLASTY ANTERIOR APPROACH;  Surgeon: Gaynelle Arabian, MD;  [facility];  Service: Orthopedics;  Laterality: Left;  . TRANSURETHRAL RESECTION OF PROSTATE  2012  . VASECTOMY    . VASECTOMY      There were no vitals filed for this visit.      Subjective Assessment - 09/24/16 1605    Subjective Patient arrives today stating he went to his appointment at [facility] and looked at the [employer], he states he has decided to refuse this brace as he does not like how it works, although he is aware of the possible advantages using this brace could give him. Nothing else major going on. His leg is feeling tight after his fall earlier this week, it is still a bit sore as well    Pertinent History HTN, DM, OSA, BPH, hx of L3-L4 surgery, eye problems with blurred vision, B anterior hips, hx of prostate cancer 10 years ago   Patient Stated Goals get strength back, get walking better and potentially get rid of device    Currently in Pain? Yes   Pain Score 5    Pain Location Other (Comment)  general muscle soreness    Pain Orientation Other (Comment)  whole body    Pain Descriptors /  Indicators Sore   Pain Type Acute pain   Pain Radiating Towards none, whole body    Pain Onset Today   Pain Frequency Constant   Aggravating Factors  muscle soreness from last session/increased work with HEP    Pain Relieving Factors none   Effect of Pain on Daily Activities just sore                          [facility] Adult PT Treatment/Exercise - 09/24/16 0001      Knee/Hip Exercises: Supine   Short Arc [employer];Right;15 reps;2 sets   Bridges Both;2 sets;10 reps   Bridges Limitations staggered feet   L LE staggered back, x20 regular    Other Supine Knee/Hip Exercises clams green TB 1x15   Other Supine Knee/Hip Exercises leg press with weight of PT 2x10 R 2x15 L      Knee/Hip Exercises: Prone   Hamstring Curl 15 reps   Hamstring  Curl Limitations 2#    Hip Extension 10 reps   Hip Extension Limitations knees bent                 PT Education - 09/24/16 1647    Education provided Yes   Education Details reviewed HEP exercises, importance of and instructions for compliance, disadvantages of not getting KAFO    Person(s) Educated Patient;Spouse   Methods Explanation   Comprehension Verbalized understanding          PT Short Term Goals - 09/03/16 1503      PT SHORT TERM GOAL #1   Title Patient to be able to ambulate 467ft in 3MWT with LRAD in order to demonstrate improved mobility and function    Baseline 9/7- 357ft with walker    Time 4   Period Weeks   Status On-going     PT SHORT TERM GOAL #2   Title Patient to maintain correct posture at least 75% of the time in order to maintain low levels of pain and improve balance    Baseline 9/7- ongoing, continues to flex trunk with walker    Time 4   Period Weeks   Status On-going     PT SHORT TERM GOAL #3   Title Patient to demonstrate ability to complete TUG in 15 seconds or less with LRAD in order to show improved balance/reduced fall risk    Baseline 9/7- 18   Time 4   Period Weeks   Status On-going     PT SHORT TERM GOAL #4   Title Patient to be able to correctly and consistently perform appropriate HEP, to be updated PRN    Baseline 9/7- "not going too good"; he just doesn't do them. States poor compliance.    Time 4   Period Weeks   Status On-going           PT Long Term Goals - 09/03/16 1506      PT LONG TERM GOAL #1   Title Patient to score at least 4-/5 in all tested muscle groups in R LE and 5/5 in all other tested muscles in order to improve mobilty and gait    Time 8   Period Weeks   Status On-going     PT LONG TERM GOAL #2   Title Patient to demonstrate correct functional mechanics for all functional tasks, including bed mobility and functional lifting, in order to protect lumbar area and reduce fall risk    Time 8  Period Weeks   Status On-going     PT LONG TERM GOAL #3   Title Patient to be able to ambulate at least 62800ft during 3MWT with LRAD in order to show improved overall mobility    Baseline 9/7- 31784ft    Time 8   Period Weeks   Status On-going     PT LONG TERM GOAL #4   Title Patient to be able to maintain SLS B LEs at least 30 seconds in order to show improved balance skills and reduced fall risk    Time 8   Period Weeks   Status On-going     PT LONG TERM GOAL #5   Title Patient to be participatory in regular exercise program, at least 20 minutes in duration, at least 4 days per week, in order to maintain functional gains and assist in improving overall health status    Time 8   Period Weeks   Status On-going               Plan - 09/24/16 1648    Clinical Impression Statement Patient arrives today stating he is sore from last session, also that he has been more compliant with HEP; however upon interview, he has only been doing 2 exercises and ignoring the rest of his HEP program. Reviewed and identified 3 additional exercises from program to work on compliance with over the next few days. He also reports he went to [employer] but after seeing the KAFOs he has decided to refuse this brace. Continued to work on functional strengthening for core and proximal muscles today; patient continues to be fatigued and challenged by proximal muscle strengthening as well as demonstrating weakness throughout R LE. Recommend re-assessment in next 1-2 sessions as well as serious conversation with patient/spouse about progress/HEP compliance at next re-assessment period.    Rehab Potential Good   PT Frequency 2x / week   PT Duration 4 weeks   PT Treatment/Interventions ADLs/Self Care Home Management;Cryotherapy;Moist Heat;Electrical Stimulation;Gait training;DME Instruction;Stair training;Functional mobility training;Therapeutic activities;Therapeutic exercise;Balance training;Neuromuscular  re-education;Patient/family education;Manual techniques;Passive range of motion;Energy conservation;Taping   PT Next Visit Plan continue aggressive proximal strength and F/U on HEP, gait, CKC work. Re-assess in next 1-2 sessions.    PT Home Exercise Plan 9/28 HHPT plus bridges/quad sets/bent knee raise    Consulted and Agree with Plan of Care Patient      Patient will benefit from skilled therapeutic intervention in order to improve the following deficits and impairments:  Abnormal gait, Improper body mechanics, Decreased coordination, Decreased mobility, Postural dysfunction, Decreased activity tolerance, Decreased strength, Decreased balance, Difficulty walking, Decreased endurance, Cardiopulmonary status limiting activity  Visit Diagnosis: Muscle weakness (generalized)  Unsteadiness on feet  Difficulty in walking, not elsewhere classified  Other symptoms and signs involving the musculoskeletal system     Problem List Patient Active Problem List   Diagnosis Date Noted  . Morbid obesity (HCC) 07/30/2015  . OSA (obstructive sleep apnea) 01/22/2015  . OA (osteoarthritis) of hip 04/20/2014  . Bladder neck contracture 04/20/2014  . Lumbago 07/05/2012  . Spondylosis with myelopathy, lumbar region 07/05/2012  . Degenerative arthritis of hip 09/22/2011  . Degenerative disc disease, lumbar 09/22/2011  . Acute on chronic renal failure (HCC) 08/07/2011  . BPH (benign prostatic hyperplasia) 08/07/2011  . Prostate cancer (HCC) 08/07/2011  . Hypertension 08/07/2011  . Diabetes mellitus type 2 with complications (HCC) 08/07/2011  . Lumbar disc disease 08/07/2011    Nedra HaiKristen Machele Deihl PT, DPT [id number]  [facility]  [facility] [place], [place], [address] Phone: [phone]   Fax:  [phone]  Name: Manuel [name] MRN: [id number] Date of Birth: 19-Sep-1935

## 2016-09-29 ENCOUNTER — Ambulatory Visit (HOSPITAL_COMMUNITY): Payer: Medicare Other | Attending: Neurosurgery | Admitting: Physical Therapy

## 2016-09-29 DIAGNOSIS — R262 Difficulty in walking, not elsewhere classified: Secondary | ICD-10-CM | POA: Diagnosis present

## 2016-09-29 DIAGNOSIS — M6281 Muscle weakness (generalized): Secondary | ICD-10-CM

## 2016-09-29 DIAGNOSIS — R29898 Other symptoms and signs involving the musculoskeletal system: Secondary | ICD-10-CM | POA: Diagnosis present

## 2016-09-29 DIAGNOSIS — R2681 Unsteadiness on feet: Secondary | ICD-10-CM

## 2016-09-29 NOTE — Therapy (Signed)
[place], [place], [address] Phone: [phone]   Fax:  [phone]  Physical Therapy Treatment (Discharge)  Patient Details  Name: Manuel [name] MRN: [id number] Date of Birth: 1935/09/04 Referring Provider: Glenna Fellows   Encounter Date: 09/29/2016      PT End of Session - 09/29/16 1513    Visit Number 17   Number of Visits [place] Medicare (G-codes done 17th session)   Authorization Time Period 08/04/16 to 10/04/16   Authorization - Visit Number 93   Authorization - Number of Visits 27   PT Start Time 0370   PT Stop Time 4888  DC due to lack of progress, no further skilled PT services appropriate    PT Time Calculation (min) 33 min   Activity Tolerance Patient tolerated treatment well   Behavior During Therapy [facility] for tasks assessed/performed      Past Medical History:  Diagnosis Date  . Arthritis   . Assistance needed for mobility    walker  . Back pain   . Cancer (HCC)    PROSTATE -- RADIATION SEED IMPLANT  . Diabetes mellitus    DX SINCE 1998  . Heart murmur   . Hypertension   . Poor balance   . Sleep apnea     Past Surgical History:  Procedure Laterality Date  . BACK SURGERY    . CHOLECYSTECTOMY    . COLONOSCOPY N/A 03/13/2016   Procedure: COLONOSCOPY;  Surgeon: Rogene Houston, MD;  [facility];  Service: Endoscopy;  Laterality: N/A;  1255  . CYSTOSCOPY WITH URETHRAL DILATATION N/A 04/20/2014   Procedure: CYSTOSCOPY WITH URETHRAL DILATATION AND FOLEY PLACEMENT;  Surgeon: Gearlean Alf, MD;  Location: [place];  Service: Orthopedics;  Laterality: N/A;  . EYE SURGERY     CATARACT BOTH EYES 2014  . Prostate radiation seed implantation    . PROSTATE SURGERY     with seed implants  . TOTAL HIP ARTHROPLASTY Right 04/20/2014   Procedure: RIGHT TOTAL HIP ARTHROPLASTY ANTERIOR APPROACH;  Surgeon: Gearlean Alf, MD;  Location: [place];  Service: Orthopedics;  Laterality:  Right;  . TOTAL HIP ARTHROPLASTY Left 08/14/2015   Procedure: LEFT TOTAL HIP ARTHROPLASTY ANTERIOR APPROACH;  Surgeon: Gaynelle Arabian, MD;  [facility];  Service: Orthopedics;  Laterality: Left;  . TRANSURETHRAL RESECTION OF PROSTATE  2012  . VASECTOMY    . VASECTOMY      There were no vitals filed for this visit.      Subjective Assessment - 09/29/16 1436    Subjective Patient arrives stating that he feels like therapy is going well; his left leg is driving him crazy due to the pain he has been having, he is seeing a hip doctor on the 5th and the surgeon that did his back surgery next week. He has been doing his HEP at home, as much as twice a day, but is only doing the hamsting stretch and heel raise, not the other ones. No falls or close calls recently.    Pertinent History HTN, DM, OSA, BPH, hx of L3-L4 surgery, eye problems with blurred vision, B anterior hips, hx of prostate cancer 10 years ago   How long can you sit comfortably? 10/3- 1 hour, 15 minutes    How long can you stand comfortably? 10/3- 15 minutes with walker    How long can you walk comfortably? 10/3- 3-4 laps here at PT clinic  Patient Stated Goals get strength back, get walking better and potentially get rid of device    Currently in Pain? No/denies            [facility] PT Assessment - 09/29/16 0001      Observation/Other Assessments   Focus on Therapeutic Outcomes (FOTO)  60% limited (was 57% limited)     Strength   Right Hip Flexion 3-/5   Right Hip Extension 2/5   Right Hip ABduction 2/5   Left Hip Flexion 4/5   Left Hip Extension 3-/5   Left Hip ABduction 2/5   Right Knee Flexion 3+/5   Right Knee Extension 2/5   Left Knee Flexion 4/5   Left Knee Extension 5/5   Right Ankle Dorsiflexion 3/5   Left Ankle Dorsiflexion 4+/5     6 minute walk test results    Aerobic Endurance Distance Walked 387   Endurance additional comments 3MWT, walker      High Level Balance   High Level Balance Comments  TUG 19.3 with Walker                              PT Education - 09/29/16 1512    Education provided Yes   Education Details lack of progress with skilled PT services, DC today    Person(s) Educated Patient   Methods Explanation   Comprehension Verbalized understanding          PT Short Term Goals - 09/29/16 1458      PT SHORT TERM GOAL #1   Title Patient to be able to ambulate 490f in 3MWT with LRAD in order to demonstrate improved mobility and function    Baseline 10/3- 3875fwith walker    Time 4   Period Weeks   Status On-going     PT SHORT TERM GOAL #2   Title Patient to maintain correct posture at least 75% of the time in order to maintain low levels of pain and improve balance    Baseline 10/3- continues to flex trunk with walker    Time 4   Period Weeks   Status On-going     PT SHORT TERM GOAL #3   Title Patient to demonstrate ability to complete TUG in 15 seconds or less with LRAD in order to show improved balance/reduced fall risk    Baseline 10/3- 19.3 with walker    Time 4   Period Weeks   Status On-going     PT SHORT TERM GOAL #4   Title Patient to be able to correctly and consistently perform appropriate HEP, to be updated PRN    Baseline 10/3- improving but compliance remains mixed    Time 4   Period Weeks   Status On-going           PT Long Term Goals - 09/29/16 1501      PT LONG TERM GOAL #1   Title Patient to score at least 4-/5 in all tested muscle groups in R LE and 5/5 in all other tested muscles in order to improve mobilty and gait    Baseline 10/3- ongoing, staying fairly weak    Time 8   Period Weeks   Status On-going     PT LONG TERM GOAL #2   Title Patient to demonstrate correct functional mechanics for all functional tasks, including bed mobility and functional lifting, in order to protect lumbar area and reduce fall risk  Baseline 10-08-23- bed mobility improving, have not trained functional lifting    Time  8   Period Weeks   Status On-going     PT LONG TERM GOAL #3   Title Patient to be able to ambulate at least 692f during 3MWT with LRAD in order to show improved overall mobility    Baseline 10/3- 3866f   Time 8   Period Weeks   Status On-going     PT LONG TERM GOAL #4   Title Patient to be able to maintain SLS B LEs at least 30 seconds in order to show improved balance skills and reduced fall risk    Time 8   Period Weeks   Status On-going     PT LONG TERM GOAL #5   Title Patient to be participatory in regular exercise program, at least 20 minutes in duration, at least 4 days per week, in order to maintain functional gains and assist in improving overall health status    Baseline 1010-11-2024have not addressed    Time 8   Period Weeks   Status On-going               Plan - 10October 11, 2017514    Clinical Impression Statement Re-assessment performed today. Upon examination, patient shows no significant improvement in objective measures on all fronts, and appears to be somewhat limited by ongoing and increasing pain in his L hip. He reports that he plans to go to his hip MD later this week to address this, and is returning to his surgeon next week. Note that throughout course of care, patient has been mostly non-compliant with  HEP and refused fitting for KAFO to assist with functional performance. DC today due to lack of progress with skilled PT services.    PT Next Visit Plan DC today due to lack of progress    Consulted and Agree with Plan of Care Patient      Patient will benefit from skilled therapeutic intervention in order to improve the following deficits and impairments:  Abnormal gait, Improper body mechanics, Decreased coordination, Decreased mobility, Postural dysfunction, Decreased activity tolerance, Decreased strength, Decreased balance, Difficulty walking, Decreased endurance, Cardiopulmonary status limiting activity  Visit Diagnosis: Muscle weakness  (generalized)  Unsteadiness on feet  Difficulty in walking, not elsewhere classified  Other symptoms and signs involving the musculoskeletal system       G-Codes - [id number]    Functional Assessment Tool Used Based on skilled clinical assessment of strength, gait, mobility, balance    Functional Limitation Mobility: Walking and moving around   Mobility: Walking and Moving Around Goal Status ([id number] least 40 percent but less than 60 percent impaired, limited or restricted   Mobility: Walking and Moving Around Discharge Status ([id number] least 60 percent but less than 80 percent impaired, limited or restricted      Problem List Patient Active Problem List   Diagnosis Date Noted  . Morbid obesity ([place]08/01/2015  . OSA (obstructive sleep apnea) 01/22/2015  . OA (osteoarthritis) of hip 04/20/2014  . Bladder neck contracture 04/20/2014  . Lumbago 07/05/2012  . Spondylosis with myelopathy, lumbar region 07/05/2012  . Degenerative arthritis of hip 09/22/2011  . Degenerative disc disease, lumbar 09/22/2011  . Acute on chronic renal failure (HCIowa08/09/2011  . BPH (benign prostatic hyperplasia) 08/07/2011  . Prostate cancer (HCO[place]08/09/2011  . Hypertension 08/07/2011  . Diabetes mellitus type 2 with complications ([facility] Island0827/78/2423. Lumbar disc disease 08/07/2011  PHYSICAL THERAPY DISCHARGE SUMMARY  Visits from Start of Care: 17  Current functional level related to goals / functional outcomes: Patient does not show objective or qualitative improvements since the time of last re-assessment; he has been limited by ongoing severe L hip pain, has refused KAFO, and has not been compliant with HEP. DC today due to lack of progress.    Remaining deficits: Functional weakness, unsteadiness, reduced functional activity tolerance, gait impairment    Education / Equipment: DC today due to lack of progress, refer back to MD for POC moving forward; HEP compliance  Plan: Patient  agrees to discharge.  Patient goals were not met. Patient is being discharged due to lack of progress.  ?????       Deniece Ree PT, DPT [facility] [address] [place], [place], [address] Phone: [phone]   Fax:  [phone]  Name: Manuel [name] MRN: [id number] Date of Birth: [birth year]

## 2016-10-01 ENCOUNTER — Encounter (HOSPITAL_COMMUNITY): Payer: Medicare Other

## 2016-10-06 ENCOUNTER — Encounter (HOSPITAL_COMMUNITY): Payer: Medicare Other | Admitting: Physical Therapy

## 2016-10-08 ENCOUNTER — Encounter (HOSPITAL_COMMUNITY): Payer: Medicare Other

## 2016-10-09 ENCOUNTER — Ambulatory Visit (INDEPENDENT_AMBULATORY_CARE_PROVIDER_SITE_OTHER): Payer: Medicare Other | Admitting: Pulmonary Disease

## 2016-10-09 ENCOUNTER — Encounter: Payer: Self-pay | Admitting: Pulmonary Disease

## 2016-10-09 VITALS — BP 132/58 | HR 49 | Ht 73.0 in | Wt 217.2 lb

## 2016-10-09 DIAGNOSIS — G4733 Obstructive sleep apnea (adult) (pediatric): Secondary | ICD-10-CM | POA: Diagnosis not present

## 2016-10-09 NOTE — Addendum Note (Signed)
Addended by: Virl Cagey on: 10/09/2016 02:59 PM   Modules accepted: Orders

## 2016-10-09 NOTE — Patient Instructions (Signed)
Will change auto CPAP setting to 5 to 17 cm H2O  Will have [facility] refit your CPAP mask to either nasal pillows or nasal mask  Will have [place] arrange for new CPAP supplies  Follow up in 1 year

## 2016-10-09 NOTE — Progress Notes (Signed)
Current Outpatient Prescriptions on File Prior to Visit  Medication Sig  . fenofibrate 160 MG tablet Take 1 tablet by mouth daily.  . insulin detemir (LEVEMIR) 100 UNIT/ML injection Inject 26 Units into the skin at bedtime.  . insulin lispro (HUMALOG) 100 UNIT/ML injection Inject 0-8 Units into the skin 3 (three) times daily before meals. Based on sliding scale  . NON FORMULARY C-Pap.   No current facility-administered medications on file prior to visit.      Chief Complaint  Patient presents with  . Follow-up    Wears CPAP nightly. Pt states that mask is leaking a lot. Pt states that he has a hard time with the mask sealing. DME: [facility]     Sleep tests PSG 12/22/14 >> AHI 17 Auto CPAP 09/08/16 to 10/07/16 >> used on 29 of 30 nights with average 6 hrs 58 min.  Average AHI 9.8 with median CPAP 12 and 95 th percentile CPAP 15 cm H2O  Past medical history Arthritis, Back pain, Prostate cancer, DM, HTN  Past surgical history, Family history, Social history, Allergies reviewed  Vital Signs BP (!) 132/58 (BP Location: Left Arm, Cuff Size: Normal)   Pulse (!) 49   Ht 6\' 1"  (1.854 m)   Wt 217 lb 3.2 oz (98.5 kg)   SpO2 100%   BMI 28.66 kg/m   History of Present Illness KHAZA [facility] is a 81 y.o. male with obstructive sleep apnea.  He uses CPAP nightly.  His main issue is mask fit.  He has full face mask >> shifts and leaks when he rolls on his side.  He has not received new CPAP supplies since original set up.  Physical Exam  General - No distress ENT - No sinus tenderness, no oral exudate, no LAN, MP 4 Cardiac - s1s2 regular, no murmur Chest - No wheeze/rales/dullness Back - No focal tenderness Abd - Soft, non-tender Ext - No edema Neuro - Normal strength Skin - No rashes Psych - normal mood, and behavior   Assessment/Plan  Obstructive sleep apnea. - he is compliant with therapy and reports benefit - will change his auto setting to 5 to 17 cm H2O - will have his DME  arrange for mask refit - will have his DME arrange for new CPAP supplies   Patient Instructions  Will change auto CPAP setting to 5 to 17 cm H2O  Will have [employer] refit your CPAP mask to either nasal pillows or nasal mask  Will have [facility] arrange for new CPAP supplies  Follow up in 1 year    Chesley Mires, MD [facility]/Sleep Pager:  [phone] 10/09/2016, 2:49 PM

## 2016-10-12 ENCOUNTER — Encounter (HOSPITAL_COMMUNITY): Payer: Medicare Other | Admitting: Physical Therapy

## 2016-10-13 ENCOUNTER — Encounter (HOSPITAL_COMMUNITY): Payer: Medicare Other | Admitting: Physical Therapy

## 2016-10-15 ENCOUNTER — Ambulatory Visit (HOSPITAL_COMMUNITY): Payer: Medicare Other | Admitting: Physical Therapy

## 2016-11-17 ENCOUNTER — Other Ambulatory Visit (HOSPITAL_COMMUNITY): Payer: Self-pay | Admitting: Neurosurgery

## 2016-11-17 ENCOUNTER — Other Ambulatory Visit: Payer: Self-pay | Admitting: Neurosurgery

## 2016-11-17 DIAGNOSIS — M4726 Other spondylosis with radiculopathy, lumbar region: Secondary | ICD-10-CM

## 2016-11-25 ENCOUNTER — Ambulatory Visit (HOSPITAL_COMMUNITY)
Admission: RE | Admit: 2016-11-25 | Discharge: 2016-11-25 | Disposition: A | Discharge status: Home / Self Care | Payer: Medicare Other | Source: Ambulatory Visit | Attending: Neurosurgery | Admitting: Neurosurgery

## 2016-11-25 DIAGNOSIS — M79605 Pain in left leg: Secondary | ICD-10-CM | POA: Insufficient documentation

## 2016-11-25 DIAGNOSIS — M4726 Other spondylosis with radiculopathy, lumbar region: Secondary | ICD-10-CM | POA: Diagnosis present

## 2016-11-25 LAB — GLUCOSE, CAPILLARY
GLUCOSE-CAPILLARY: 86 mg/dL (ref 65–99)
GLUCOSE-CAPILLARY: 84 mg/dL (ref 65–99)

## 2016-11-25 MED ORDER — TRAMADOL HCL 50 MG PO TABS: 50.0000 mg | ORAL_TABLET | Freq: Four times a day (QID) | ORAL | Status: DC | PRN

## 2016-11-25 MED ORDER — LIDOCAINE HCL (PF) 1 % IJ SOLN
10.0000 mL | Freq: Once | INTRAMUSCULAR | Status: AC
  Administered 2016-11-25: 5 mL via INTRADERMAL

## 2016-11-25 MED ORDER — OXYCODONE HCL 5 MG PO TABS: 5.0000 mg | ORAL_TABLET | ORAL | Status: DC | PRN

## 2016-11-25 MED ORDER — IOPAMIDOL (ISOVUE-M 200) INJECTION 41%
20.0000 mL | Freq: Once | INTRAMUSCULAR | Status: AC
  Administered 2016-11-25: 20 mL via INTRATHECAL

## 2016-11-25 MED ORDER — IOPAMIDOL (ISOVUE-M 200) INJECTION 41%
INTRAMUSCULAR | Status: AC
  Administered 2016-11-25: 20 mL via INTRATHECAL
  Filled 2016-11-25: qty 10

## 2016-11-25 MED ORDER — DIAZEPAM 5 MG PO TABS
ORAL_TABLET | ORAL | Status: AC
Start: 2016-11-25 — End: 2016-11-25
  Administered 2016-11-25: 10 mg via ORAL
  Filled 2016-11-25: qty 2

## 2016-11-25 MED ORDER — LIDOCAINE HCL 1 % IJ SOLN
INTRAMUSCULAR | Status: AC
  Filled 2016-11-25: qty 10

## 2016-11-25 MED ORDER — ONDANSETRON HCL 4 MG/2ML IJ SOLN: 4.0000 mg | Freq: Four times a day (QID) | INTRAMUSCULAR | Status: DC | PRN

## 2016-11-25 MED ORDER — DIAZEPAM 5 MG PO TABS
10.0000 mg | ORAL_TABLET | Freq: Once | ORAL | Status: DC
  Filled 2016-11-25: qty 2

## 2016-11-25 MED ORDER — DIAZEPAM 5 MG PO TABS
10.0000 mg | ORAL_TABLET | Freq: Once | ORAL | Status: AC
  Administered 2016-11-25: 10 mg via ORAL

## 2016-11-25 NOTE — Op Note (Signed)
11/25/2016 Lumbar Myelogram  PATIENT:  Manuel [name] is a 81 y.o. male  PRE-OPERATIVE DIAGNOSIS:  Left lower extremity pain, osteoarthritis with radiculopathy  POST-OPERATIVE DIAGNOSIS:  Left lower extremity pain, osteoarthritis with radiculopathy   PROCEDURE:  Lumbar Myelogram  SURGEON:  Nadirah Socorro  ANESTHESIA:   local LOCAL MEDICATIONS USED:  LIDOCAINE  and Amount: 10 ml Procedure Note: Manuel [name] is a 81 y.o. male Was taken to the fluoroscopy suite and  positioned prone on the fluoroscopy table. His back was prepared and draped in a sterile manner. I infiltrated 10 cc into the lumbar region. I then introduced a spinal needle into the thecal sac at the L4/5 interlaminar space. I infiltrated 20cc of Isovue 200 into the thecal sac. Fluoroscopy showed the needle and contrast in the thecal sac. Manuel [name] tolerated the procedure well. he Will be taken to CT for evaluation.     PATIENT DISPOSITION:  Short Stay

## 2016-11-25 NOTE — Discharge Instructions (Signed)
Myelography, Care After °These instructions give you information on caring for yourself after your procedure. Your doctor may also give you more specific instructions. Call your doctor if you have any problems or questions after your procedure. °HOME CARE °· Rest the first day. °· When you rest, lie flat, with your head slightly raised (elevated). °· Avoid heavy lifting and activity for 48 hours, or as told by your doctor. °· You may take the bandage (dressing) off one day after the test, or as told by your doctor. °· Take all medicines only as told by your doctor. °· Ask your doctor when it is okay to take a shower or bath. °· Ask your doctor when your test results will be ready and how you can get them. Make sure you follow up and get your results. °· Do not drink alcohol for 24 hours, or as told by your doctor. °· Drink enough fluid to keep your pee (urine) clear or pale yellow. °GET HELP IF:  °· You have a fever. °· You have a headache. °· You feel sick to your stomach (nauseous) or throw up (vomit). °· You have pain or cramping in your belly (abdomen). °GET HELP RIGHT AWAY IF:  °· You have a headache with a stiff neck or fever. °· You have trouble breathing. °· Any of the places where the needles were put in are: °¨ Puffy (swollen) or red. °¨ Sore or hot to the touch. °¨ Draining yellowish-white fluid (pus). °¨ Bleeding. °MAKE SURE YOU: °· Understand these instructions. °· Will watch your condition. °· Will get help right away if you are not doing well or get worse. °This information is not intended to replace advice given to you by your health care provider. Make sure you discuss any questions you have with your health care provider. °Document Released: 09/22/2008 Document Revised: 01/04/2015 Document Reviewed: 09/26/2015 °Elsevier Interactive Patient Education © 2017 [employer] ° °

## 2016-12-02 ENCOUNTER — Encounter (INDEPENDENT_AMBULATORY_CARE_PROVIDER_SITE_OTHER): Payer: Medicare Other | Admitting: Ophthalmology

## 2016-12-02 DIAGNOSIS — I10 Essential (primary) hypertension: Secondary | ICD-10-CM | POA: Diagnosis not present

## 2016-12-02 DIAGNOSIS — H43813 Vitreous degeneration, bilateral: Secondary | ICD-10-CM | POA: Diagnosis not present

## 2016-12-02 DIAGNOSIS — H353132 Nonexudative age-related macular degeneration, bilateral, intermediate dry stage: Secondary | ICD-10-CM

## 2016-12-02 DIAGNOSIS — E11319 Type 2 diabetes mellitus with unspecified diabetic retinopathy without macular edema: Secondary | ICD-10-CM | POA: Diagnosis not present

## 2016-12-02 DIAGNOSIS — H35033 Hypertensive retinopathy, bilateral: Secondary | ICD-10-CM | POA: Diagnosis not present

## 2016-12-02 DIAGNOSIS — E113393 Type 2 diabetes mellitus with moderate nonproliferative diabetic retinopathy without macular edema, bilateral: Secondary | ICD-10-CM

## 2016-12-15 ENCOUNTER — Encounter (INDEPENDENT_AMBULATORY_CARE_PROVIDER_SITE_OTHER): Payer: Self-pay | Admitting: Ophthalmology

## 2017-01-12 ENCOUNTER — Telehealth: Payer: Self-pay | Admitting: Neurology

## 2017-01-12 NOTE — Telephone Encounter (Signed)
Dr. Christella Noa called concerning this patient. The patient has had some gradually progressive weakness of the lower extremities, recent EMG and nerve conduction study evaluation done through Dr. Brien Few shows evidence of a severe peripheral neuropathy. He began having problems with walking in March 2017, it has gotten worse since August 2017.  We will have him seen as a new patient in the near future. Dr. Christella Noa is to send the records of the nerve conduction study to our office.

## 2017-01-15 ENCOUNTER — Ambulatory Visit: Payer: Medicare Other | Admitting: Urology

## 2017-01-21 ENCOUNTER — Telehealth: Payer: Self-pay

## 2017-01-21 ENCOUNTER — Ambulatory Visit (INDEPENDENT_AMBULATORY_CARE_PROVIDER_SITE_OTHER): Payer: Medicare Other | Admitting: Neurology

## 2017-01-21 ENCOUNTER — Encounter: Payer: Self-pay | Admitting: Neurology

## 2017-01-21 VITALS — BP 158/66 | HR 41 | Ht 73.0 in | Wt 226.5 lb

## 2017-01-21 DIAGNOSIS — M519 Unspecified thoracic, thoracolumbar and lumbosacral intervertebral disc disorder: Secondary | ICD-10-CM

## 2017-01-21 DIAGNOSIS — E538 Deficiency of other specified B group vitamins: Secondary | ICD-10-CM | POA: Diagnosis not present

## 2017-01-21 DIAGNOSIS — E1144 Type 2 diabetes mellitus with diabetic amyotrophy: Secondary | ICD-10-CM

## 2017-01-21 DIAGNOSIS — E1142 Type 2 diabetes mellitus with diabetic polyneuropathy: Secondary | ICD-10-CM

## 2017-01-21 DIAGNOSIS — E0844 Diabetes mellitus due to underlying condition with diabetic amyotrophy: Secondary | ICD-10-CM | POA: Diagnosis not present

## 2017-01-21 DIAGNOSIS — M21372 Foot drop, left foot: Secondary | ICD-10-CM

## 2017-01-21 DIAGNOSIS — E1140 Type 2 diabetes mellitus with diabetic neuropathy, unspecified: Secondary | ICD-10-CM

## 2017-01-21 HISTORY — DX: Type 2 diabetes mellitus with diabetic amyotrophy: E11.44

## 2017-01-21 HISTORY — DX: Type 2 diabetes mellitus with diabetic neuropathy, unspecified: E11.40

## 2017-01-21 NOTE — Telephone Encounter (Signed)
-----   Message from Kathrynn Ducking, MD sent at 01/21/2017 12:58 PM EST ----- Home health physical therapy has been ordered on this patient

## 2017-01-21 NOTE — Telephone Encounter (Signed)
Referral sent to [facility] ?

## 2017-01-21 NOTE — Progress Notes (Signed)
Reason for visit: Leg weakness  Referring physician: Dr. Orlie [name] is a 82 y.o. male  History of present illness:  Manuel [name] is a 82 year old right-handed white male with a history of diabetes. The patient had low back pain and pain down the right leg previously, he underwent lumbosacral spine surgery by Dr. Carloyn Manner in April 2017. Shortly after surgery, the patient developed more discomfort down the right leg and weakness of the right leg. The patient began having significant issues with weight loss, he has lost 35 pounds within 6 months after surgery. Within the last 2 months, he developed a similar problem on the left leg with onset of pain around the hip and down into the thigh, with onset of weakness of the left leg. The patient has prominent problems with foot drop on the lower extremity bilaterally, more so on the left. The patient reports numbness of the left foot. The patient has burning and stinging of the feet, he denies any numbness of the hands or weakness of the arms. He denies any neck pain or pain down the arms. He has chronic problems with constipation and some occasional urinary incontinence, this has not changed since onset of the leg weakness. The patient is not able to ambulate effectively at this time, he can take a few steps with a walker. He has had bilateral hip replacements done in the past. The patient has an seen by Dr. Christella Noa, and EMG and nerve conduction study evaluation was done by Dr. Brien Few. There is evidence of a diabetic peripheral neuropathy on the EMG and nerve conduction study. The patient is sent to this office for further evaluation. The patient believes that the strength in the right leg has improved slightly since onset, but he remains quite weak.   Past Medical History:  Diagnosis Date  . Arthritis   . Assistance needed for mobility    walker  . Back pain   . Cancer (HCC)    PROSTATE -- RADIATION SEED IMPLANT  . Diabetes mellitus    DX  SINCE 1998  . 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 Diabetic neuropathy ([place]) 01/21/2017  . Heart murmur   . Hypertension   . Poor balance   . Sleep apnea     Past Surgical History:  Procedure Laterality Date  . BACK SURGERY  2015  . CATARACT EXTRACTION     CATARACT BOTH EYES 2014  . CHOLECYSTECTOMY    . COLONOSCOPY N/A 03/13/2016   Procedure: COLONOSCOPY;  Surgeon: Rogene Houston, MD;  [facility];  Service: Endoscopy;  Laterality: N/A;  1255  . CYSTOSCOPY WITH URETHRAL DILATATION N/A 04/20/2014   Procedure: CYSTOSCOPY WITH URETHRAL DILATATION AND FOLEY PLACEMENT;  Surgeon: Gearlean Alf, MD;  Location: [facility];  Service: Orthopedics;  Laterality: N/A;  . Prostate radiation seed implantation    . PROSTATE SURGERY     with seed implants  . TOTAL HIP ARTHROPLASTY Right 04/20/2014   Procedure: RIGHT TOTAL HIP ARTHROPLASTY ANTERIOR APPROACH;  Surgeon: Gearlean Alf, MD;  Location: [facility];  Service: Orthopedics;  Laterality: Right;  . TOTAL HIP ARTHROPLASTY Left 08/14/2015   Procedure: LEFT TOTAL HIP ARTHROPLASTY ANTERIOR APPROACH;  Surgeon: Gaynelle Arabian, MD;  Location: [facility];  Service: Orthopedics;  Laterality: Left;  . TRANSURETHRAL RESECTION OF PROSTATE  2012  . VASECTOMY      Family History  Problem Relation Age of Onset  . Asthma Mother   . Diabetes Mother   .  Heart disease Mother   . Heart attack Father   . COPD Brother     Social history:  reports that he quit smoking about 38 years ago. His smoking use included Cigarettes. He has a 2.90 pack-year smoking history. He has never used smokeless tobacco. He reports that he does not drink alcohol or use drugs.  Medications:  Prior to Admission medications   Medication Sig Start Date End Date Taking? Authorizing Provider  aspirin EC 81 MG tablet Take 81 mg by mouth daily.   Yes Historical Provider, MD  fenofibrate 160 MG tablet Take 1 tablet by mouth daily. 01/14/16  Yes Historical Provider, MD  gabapentin  (NEURONTIN) 600 MG tablet Take 1,200-2,400 mg by mouth See admin instructions. Pt takes 1200 mg in the morning, and 2400 mg at bedtime   Yes Historical Provider, MD  insulin detemir (LEVEMIR) 100 UNIT/ML injection Inject 26 Units into the skin at bedtime.   Yes Historical Provider, MD  insulin lispro (HUMALOG) 100 UNIT/ML injection Inject 0-8 Units into the skin See admin instructions. Three times daily with meals as needed. Based on sliding scale If blood sugar is above 200   Yes Historical Provider, MD  metoprolol succinate (TOPROL-XL) 25 MG 24 hr tablet Take 1 tablet by mouth daily. 09/12/16  Yes Historical Provider, MD  NON FORMULARY C-Pap.   Yes Historical Provider, MD  rivastigmine (EXELON) 3 MG capsule Take 1 capsule by mouth 2 (two) times daily. 09/03/16  Yes Historical Provider, MD      Allergies  Allergen Reactions  . Hydrocodone Nausea And Vomiting    Pt reports if he takes nausea medicine with Hydrocodone, then he can take it.    ROS:  Out of a complete 14 system review of symptoms, the patient complains only of the following symptoms, and all other reviewed systems are negative.  Swelling in the legs Blurred vision Urination problems, urinary incontinence, impotence Memory loss Decreased energy Snoring  Blood pressure (!) 158/66, pulse (!) 41, height 6\' 1"  (1.854 m), weight 226 lb 8 oz (102.7 kg).  Physical Exam  General: The patient is alert and cooperative at the time of the examination.  Eyes: Pupils are equal, round, and reactive to light. Discs are flat bilaterally.  Neck: The neck is supple, no carotid bruits are noted.  Respiratory: The respiratory examination is clear.  Cardiovascular: The cardiovascular examination reveals a regular rate and rhythm, no obvious murmurs or rubs are noted.  Skin: Extremities are with 1+ edema bilaterally below the knees.  Neurologic Exam  Mental status: The patient is alert and oriented x 3 at the time of the examination.  The patient has apparent normal recent and remote memory, with an apparently normal attention span and concentration ability.  Cranial nerves: Facial symmetry is present. There is good sensation of the face to pinprick and soft touch bilaterally. The strength of the facial muscles and the muscles to head turning and shoulder shrug are normal bilaterally. Speech is well enunciated, no aphasia or dysarthria is noted. Extraocular movements are full. Visual fields are full. The tongue is midline, and the patient has symmetric elevation of the soft palate. No obvious hearing deficits are noted.  Motor: The motor testing reveals intrinsic muscle weakness of the hands bilaterally, otherwise strength of the arms is full. With the lower extremities, the patient has bilateral weakness with hip flexion with 4/5 strength, with the right leg, the quadriceps muscles are 4 minus/5, 4+/5 on the left. Patient has good  adductor strength and abduction with the thighs bilaterally. Hamstring strength is good on the right, 4/5 on the left. The patient has a prominent foot drop on the left, mild foot drop on the right. Good symmetric motor tone is noted throughout.  Sensory: Sensory testing is intact to pinprick, soft touch, vibration sensation, and position sense on the upper extremities. With the lower extremity, there is a stocking pattern pinprick sensory deficit above the knees bilaterally, markedly reduction in vibration and position sense in both feet. No evidence of extinction is noted.  Coordination: Cerebellar testing reveals good finger-nose-finger and heel-to-shin bilaterally.  Gait and station: Gait was not tested secondary to severe weakness of the legs. No drift is seen.  Reflexes: Deep tendon reflexes are symmetric, but are depressed bilaterally. Toes are downgoing bilaterally.   Assessment/Plan:  1. History of lumbosacral spine surgery  2. Diabetic peripheral neuropathy  3. Bilateral diabetic  amyotrophy  4. Gait disorder  The patient has underlying significant diabetic peripheral neuropathy with impairment of sensation in both feet and weakness of the hands and the legs below the knees. The patient has developed sudden onset of pain and bilateral proximal weakness that came on approximately 6 months apart, associated with a 35 pound weight loss. This is consistent with the diabetic amyotrophy syndrome. The patient indicates that he has a hemoglobin A1c of around 7. The patient may benefit from home health physical therapy to develop a home exercise program, a prescription was given for an AFO brace for the left foot. He will follow-up in 6 months.  Jill Alexanders MD 01/21/2017 12:50 PM  [facility] [address] [place], [place], [place] [address]  Phone [phone] Fax [phone]

## 2017-01-26 LAB — MULTIPLE MYELOMA PANEL, SERUM
ALBUMIN SERPL ELPH-MCNC: 3.5 g/dL (ref 2.9–4.4)
Albumin/Glob SerPl: 1.3 (ref 0.7–1.7)
Alpha 1: 0.2 g/dL (ref 0.0–0.4)
Alpha2 Glob SerPl Elph-Mcnc: 0.7 g/dL (ref 0.4–1.0)
B-GLOBULIN SERPL ELPH-MCNC: 1 g/dL (ref 0.7–1.3)
GAMMA GLOB SERPL ELPH-MCNC: 1 g/dL (ref 0.4–1.8)
GLOBULIN, TOTAL: 2.9 g/dL (ref 2.2–3.9)
IgA/Immunoglobulin A, Serum: 260 mg/dL (ref 61–437)
IgG (Immunoglobin G), Serum: 914 mg/dL (ref 700–1600)
IgM (Immunoglobulin M), Srm: 36 mg/dL (ref 15–143)
Total Protein: 6.4 g/dL (ref 6.0–8.5)

## 2017-01-26 LAB — VITAMIN B12: VITAMIN B 12: 244 pg/mL (ref 232–1245)

## 2017-01-26 LAB — PAN-ANCA
ANCA Proteinase 3: <3.5 U/mL (ref 0.0–3.5)
Atypical pANCA: <1:20 {titer}
C-ANCA: <1:20 {titer}
P-ANCA: <1:20 {titer}

## 2017-01-26 LAB — B. BURGDORFI ANTIBODIES: Lyme IgG/IgM Ab: <0.91 {ISR} (ref 0.00–0.90)

## 2017-01-26 LAB — RHEUMATOID FACTOR

## 2017-01-26 LAB — SEDIMENTATION RATE: SED RATE: 6 mm/h (ref 0–30)

## 2017-01-26 LAB — ANA W/REFLEX: ANA: NEGATIVE

## 2017-01-26 LAB — ANGIOTENSIN CONVERTING ENZYME: Angio Convert Enzyme: 55 U/L (ref 14–82)

## 2017-01-27 ENCOUNTER — Telehealth: Payer: Self-pay

## 2017-01-27 NOTE — Telephone Encounter (Signed)
-----   Message from Kathrynn Ducking, MD sent at 01/26/2017  5:29 PM EST -----   The blood work results are unremarkable. Please call the patient.  ----- Message ----- From: Lavone Neri Lab Results In Sent: 01/22/2017   7:42 AM To: Kathrynn Ducking, MD

## 2017-01-27 NOTE — Telephone Encounter (Signed)
Called pt w/ unremarkable lab results. Verbalized understanding and appreciation for call. 

## 2017-02-09 ENCOUNTER — Encounter: Payer: Self-pay | Admitting: *Deleted

## 2017-02-09 NOTE — Progress Notes (Signed)
Faxed signed POC by CW,MD back to [facility] Cert. Period 01/22/17-03/22/17. Fax: [phone]. Received confirmation.

## 2017-02-14 IMAGING — DX DG LUMBAR SPINE COMPLETE 4+V
5 series · 5 of 5 positions shown · non-contrast
Comparison: Lumbar spine MRI dated 03/07/2016

CLINICAL DATA: 81-year-old male with fall and lower back pain.

EXAM:
LUMBAR SPINE - COMPLETE 4+ VIEW

[l-spine ap]
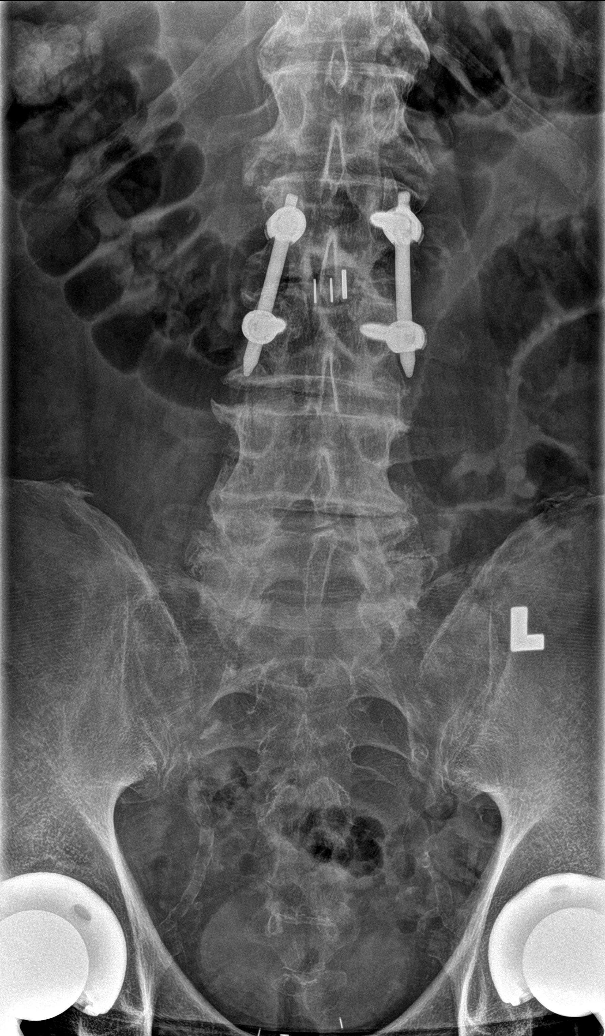

[l-spine obl (1 of 2)]
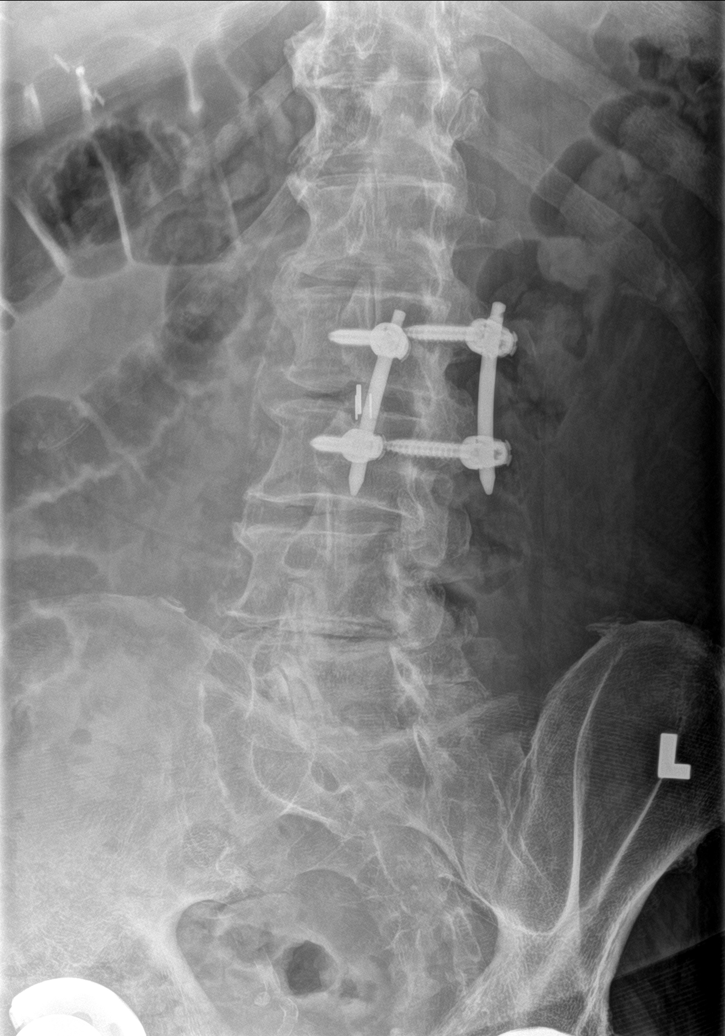

[l-spine obl (2 of 2)]
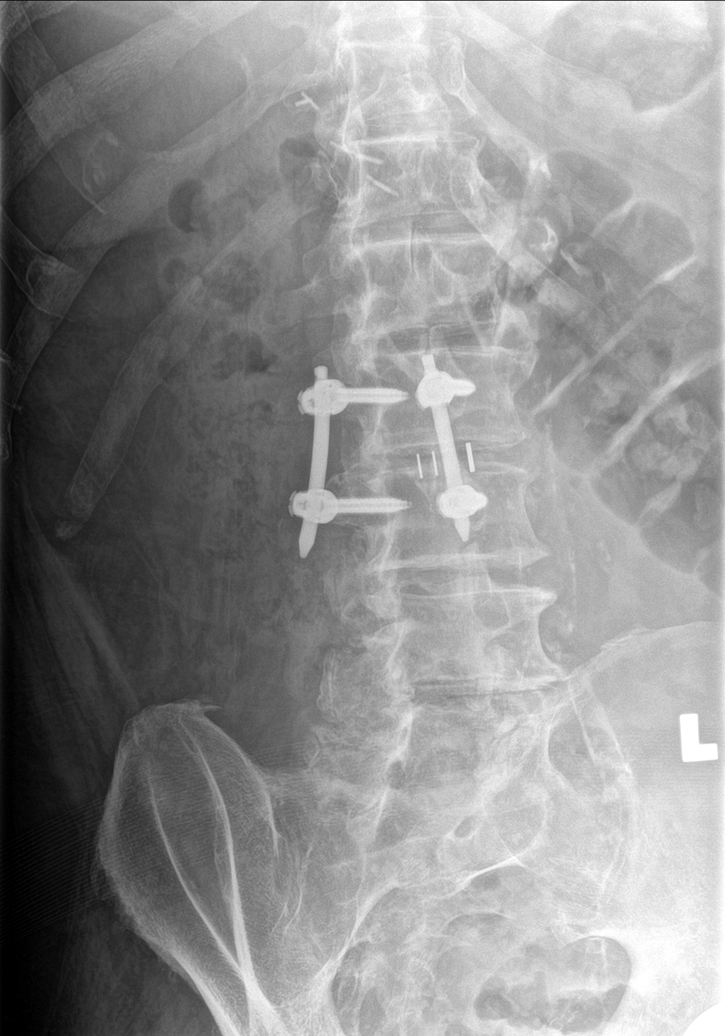

[l-spine lat]
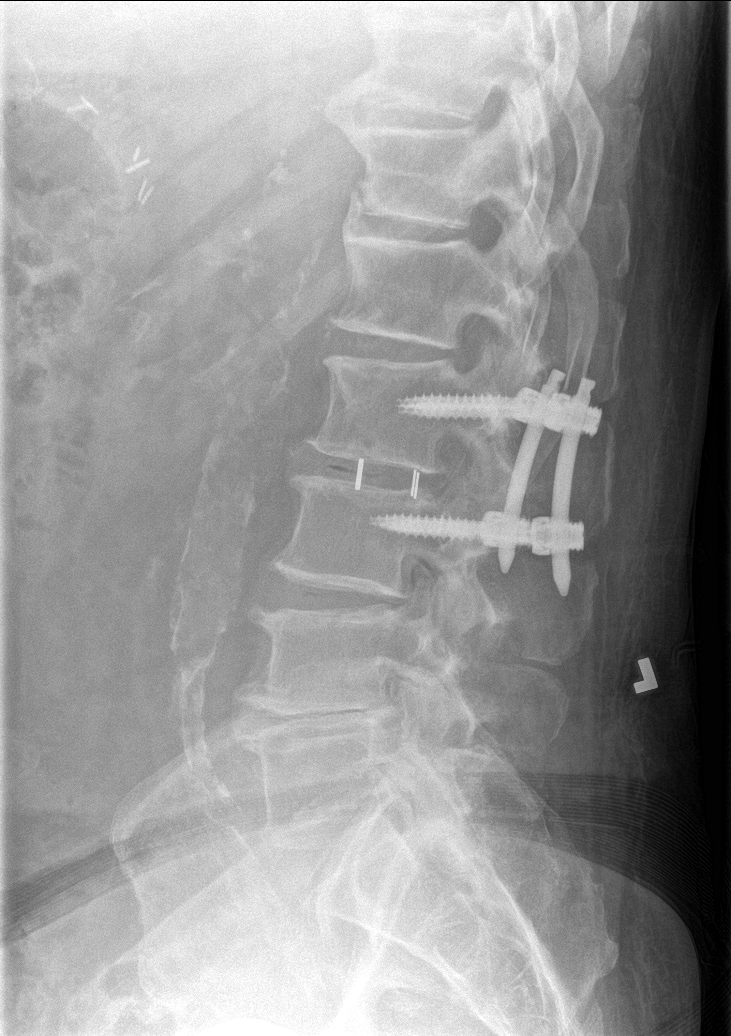

[l-spine spot]
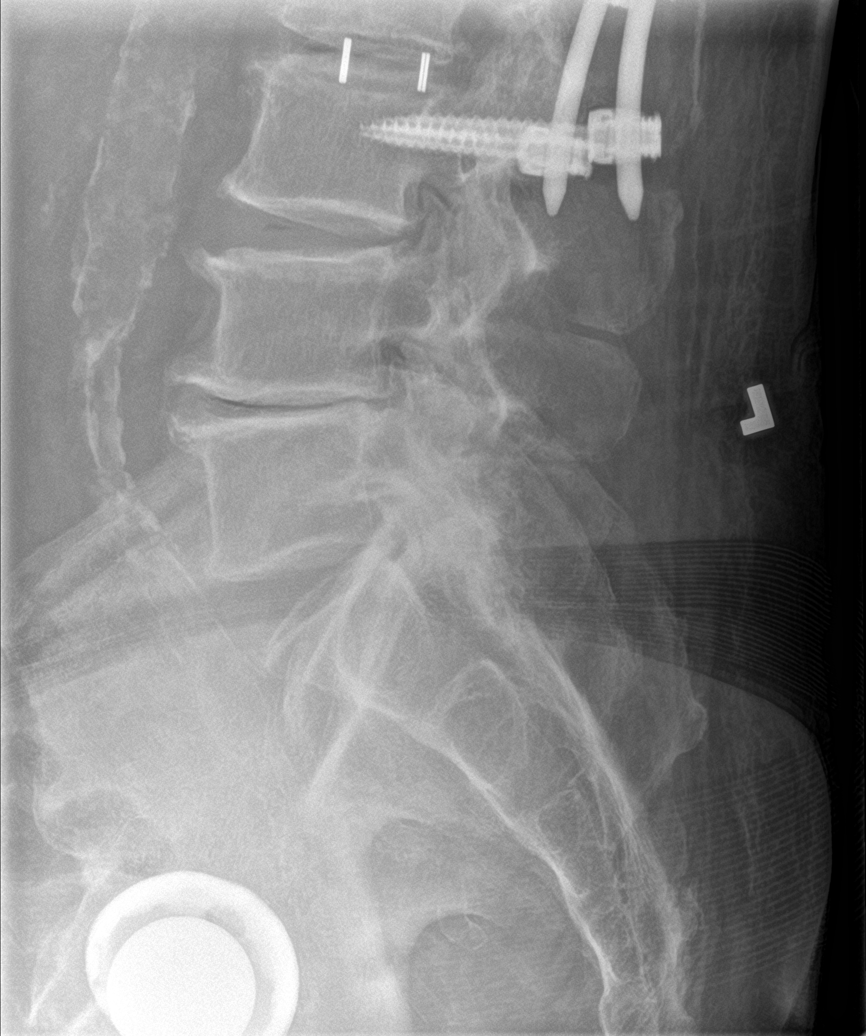

[5 of 5 positions shown; findings below may reference images not displayed]

FINDINGS: There is no acute fracture or subluxation of the lumbar spine. There
are extensive multilevel degenerative changes with anterior spurring
and osteophyte formation. Multilevel disc space narrowing most
prominent at L4-5. L2-L3 disc spacer and posterior fixation hardware
noted. There is apparent narrowing of the neural foramina in the
lower lumbar spine primarily involving L3-L4 and L4-L5. The bones
are mildly osteopenic. Bilateral hip arthroplasties are partially
visualized.

There is aortoiliac atherosclerotic disease.
IMPRESSION: No acute fracture or subluxation.

Multilevel degenerative changes with apparent neural foraminal
narrowing at L3-L4 and L4-5.

L2-L3 posterior fixation hardware.

## 2017-02-26 ENCOUNTER — Ambulatory Visit: Payer: Medicare Other | Admitting: Urology

## 2017-03-04 ENCOUNTER — Telehealth: Payer: Self-pay | Admitting: Neurology

## 2017-03-04 NOTE — Telephone Encounter (Signed)
Mel with [facility] called office requesting continuous of home health PT 1 time weekly for 4 weeks. Continue gait training, and strengthening program

## 2017-03-04 NOTE — Telephone Encounter (Signed)
Called Manuel [name] back. Gave VO for continuous home health PT 1x/week for 4 weeks to continue gait training and strengthening. He verbalized understanding.

## 2017-04-06 ENCOUNTER — Encounter: Payer: Self-pay | Admitting: *Deleted

## 2017-04-06 NOTE — Progress Notes (Signed)
Faxed signed orders by CW,MD back to [facility]. [id number]. Fax: [phone]. Received confirmation.

## 2017-05-07 ENCOUNTER — Ambulatory Visit: Payer: Medicare Other | Admitting: Urology

## 2017-06-09 ENCOUNTER — Ambulatory Visit (INDEPENDENT_AMBULATORY_CARE_PROVIDER_SITE_OTHER): Payer: Medicare Other | Admitting: Neurology

## 2017-06-09 ENCOUNTER — Encounter: Payer: Self-pay | Admitting: Neurology

## 2017-06-09 VITALS — BP 198/60 | HR 43 | Ht 73.0 in | Wt 233.5 lb

## 2017-06-09 DIAGNOSIS — R251 Tremor, unspecified: Secondary | ICD-10-CM

## 2017-06-09 DIAGNOSIS — E0844 Diabetes mellitus due to underlying condition with diabetic amyotrophy: Secondary | ICD-10-CM

## 2017-06-09 NOTE — Progress Notes (Signed)
Reason for visit: Tremor  Referring physician: Dr. Gabriel Rainwater is a 82 y.o. male  History of present illness:  Manuel [name] is an 82 year old right-handed white male with a history of diabetic amyotrophy on both lower extremities. The left leg is affected more severely. The patient was seen in January 2018 for this issue. Since that time, his right leg has improved in strength, the left leg has not. He has proximal weakness and a footdrop on the left. The patient no longer has pain or discomfort in the legs. He has not had any falls, he mainly uses a wheelchair in the house, but he uses a walker for ambulation outside the house. The patient is being fitted for an AFO brace on the left. He comes in today with a new problem. Within the last 3 months he has developed a resting tremor involving the left upper extremity. He also notices the tremor when he is holding the newspaper. He denies tremor when he is using the arms. He has no problems with handwriting or with feeding himself. He has not had any tremor involving the head or neck or jaw. He denies any family history of tremor. The patient denies any issues controlling the bowels or the bladder. He has not had any new numbness or weakness, is left foot remains numb. He is sent to this office for an evaluation.  Past Medical History:  Diagnosis Date  . Arthritis   . Assistance needed for mobility    walker  . Back pain   . Cancer (HCC)    PROSTATE -- RADIATION SEED IMPLANT  . Diabetes mellitus    DX SINCE 1998  . Diabetic amyotrophy ([facility]) 01/21/2017  . Diabetic neuropathy ([facility]) 01/21/2017  . Heart murmur   . Hypertension   . Poor balance   . Sleep apnea     Past Surgical History:  Procedure Laterality Date  . BACK SURGERY  2015  . CATARACT EXTRACTION     CATARACT BOTH EYES 2014  . CHOLECYSTECTOMY    . COLONOSCOPY N/A 03/13/2016   Procedure: COLONOSCOPY;  Surgeon: Rogene Houston, MD;  [facility];  Service:  Endoscopy;  Laterality: N/A;  1255  . CYSTOSCOPY WITH URETHRAL DILATATION N/A 04/20/2014   Procedure: CYSTOSCOPY WITH URETHRAL DILATATION AND FOLEY PLACEMENT;  Surgeon: Gearlean Alf, MD;  Location: [facility];  Service: Orthopedics;  Laterality: N/A;  . Prostate radiation seed implantation    . PROSTATE SURGERY     with seed implants  . TOTAL HIP ARTHROPLASTY Right 04/20/2014   Procedure: RIGHT TOTAL HIP ARTHROPLASTY ANTERIOR APPROACH;  Surgeon: Gearlean Alf, MD;  Location: [facility];  Service: Orthopedics;  Laterality: Right;  . TOTAL HIP ARTHROPLASTY Left 08/14/2015   Procedure: LEFT TOTAL HIP ARTHROPLASTY ANTERIOR APPROACH;  Surgeon: Gaynelle Arabian, MD;  [facility];  Service: Orthopedics;  Laterality: Left;  . TRANSURETHRAL RESECTION OF PROSTATE  2012  . VASECTOMY      Family History  Problem Relation Age of Onset  . Asthma Mother   . Diabetes Mother   . Heart disease Mother   . Heart attack Father   . COPD Brother     Social history:  reports that he quit smoking about 38 years ago. His smoking use included Cigarettes. He has a 2.90 pack-year smoking history. He has never used smokeless tobacco. He reports that he does not drink alcohol or use drugs.  Medications:  Prior to Admission medications  Medication Sig Start Date End Date Taking? Authorizing Provider  aspirin EC 81 MG tablet Take 81 mg by mouth daily.   Yes [provider]  fenofibrate 160 MG tablet Take 1 tablet by mouth daily. 01/14/16  Yes [provider]  gabapentin (NEURONTIN) 600 MG tablet Take 1,200-2,400 mg by mouth See admin instructions. Pt takes 1200 mg in the morning, and 2400 mg at bedtime   Yes [provider]  insulin detemir (LEVEMIR) 100 UNIT/ML injection Inject 26 Units into the skin at bedtime.   Yes [provider]  insulin lispro (HUMALOG) 100 UNIT/ML injection Inject 0-8 Units into the skin See admin instructions. Three times daily with meals as needed. Based on  sliding scale If blood sugar is above 200   Yes [provider]  metoprolol succinate (TOPROL-XL) 25 MG 24 hr tablet Take 1 tablet by mouth daily. 09/12/16  Yes [provider]  NON FORMULARY C-Pap.   Yes [provider]  rivastigmine (EXELON) 3 MG capsule Take 1 capsule by mouth 2 (two) times daily. 09/03/16  Yes [provider]  UNKNOWN TO PATIENT 1 tablet daily. Patient knows he is on another BP pill, but cannot remember name   Yes [provider]      Allergies  Allergen Reactions  . Hydrocodone Nausea And Vomiting    Pt reports if he takes nausea medicine with Hydrocodone, then he can take it.    ROS:  Out of a complete 14 system review of symptoms, the patient complains only of the following symptoms, and all other reviewed systems are negative.  Blurred vision Leg swelling Tremor  Blood pressure (!) 198/60, pulse (!) 43, height 6\' 1"  (1.854 m), weight 233 lb 8 oz (105.9 kg).  Physical Exam  General: The patient is alert and cooperative at the time of the examination.  Eyes: Pupils are equal, round, and reactive to light. Discs are flat bilaterally.  Neck: The neck is supple, there is radiation of the cardiac murmur into the carotid arteries bilaterally.  Respiratory: The respiratory examination is clear.  Cardiovascular: The cardiovascular examination reveals a regular rate and rhythm, there is a grade II/VI systolic ejection murmur maximal at the aortic area.  Skin: Extremities are with 1+ edema at ankles bilaterally..  Neurologic Exam  Mental status: The patient is alert and oriented x 3 at the time of the examination. The patient has apparent normal recent and remote memory, with an apparently normal attention span and concentration ability.  Cranial nerves: Facial symmetry is present. There is good sensation of the face to pinprick and soft touch bilaterally. The strength of the facial muscles and the muscles to head  turning and shoulder shrug are normal bilaterally. Speech is well enunciated, no aphasia or dysarthria is noted. Extraocular movements are full. Visual fields are full. The tongue is midline, and the patient has symmetric elevation of the soft palate. No obvious hearing deficits are noted.  Motor: The motor testing reveals 5 over 5 strength of the upper extremities. With the lower extremity, there appears to be slight weakness with hip flexion on the right, at this point there is no evidence of a footdrop on the right. Good strength is otherwise seen in the right leg. With the left leg, there is 4/5 strength with hip flexion and with knee flexion. A prominent foot drop with weakness of inversion and eversion of the left foot is seen. Good symmetric motor tone is noted throughout.  Sensory: Sensory testing  is intact to pinprick, soft touch, vibration sensation, and position sense on the upper extremities. With the lower extremities, there is a stocking pattern pinprick sensory deficit on the left upper the knee and on the right one half way up the leg. Vibration sensation is decreased on the left foot as compared to the right, position sense is decreased on both feet. No evidence of extinction is noted.  Coordination: Cerebellar testing reveals good finger-nose-finger and heel-to-shin bilaterally.  Gait and station: Gait is slightly wide-based, the patient uses a walker for ambulation. The patient has a steppage gait with the left leg. When walking without a walker, the balance is quite poor. Romberg is positive, the patient falls backwards.  Reflexes: Deep tendon reflexes are symmetric, but are depressed bilaterally. Toes are downgoing bilaterally.   Assessment/Plan:   1. Diabetic amyotrophy bilaterally, left greater than right  2. Left upper extremity resting tremor, new onset  The patient appears to have a resting tremor that could be consistent with a Parkinson's type tremor, the patient does  not have enough clinical signs to diagnosis Parkinson's disease at this time. He will need to be followed for this issue. He finds that he will be moving on 07/28/2017. The right lower extremity strength appears to have improved, the left leg has not. The patient is being fitted for an AFO brace which should help him ambulate. He is to do leg strengthening exercises. He will try to make contact with a neurologist in the [place], [place] area after he moves.   Manuel Alexanders MD 06/09/2017 11:32 AM  [facility] [address] [place], [place] [address]  Phone [phone] Fax [phone]

## 2017-06-25 ENCOUNTER — Ambulatory Visit: Payer: Medicare Other | Admitting: Neurology

## 2017-07-06 ENCOUNTER — Ambulatory Visit: Payer: Medicare Other | Admitting: Neurology

## 2017-07-09 ENCOUNTER — Ambulatory Visit (INDEPENDENT_AMBULATORY_CARE_PROVIDER_SITE_OTHER): Payer: Medicare Other | Admitting: Urology

## 2017-07-09 ENCOUNTER — Other Ambulatory Visit (HOSPITAL_COMMUNITY)
Admission: RE | Admit: 2017-07-09 | Discharge: 2017-07-09 | Disposition: A | Discharge status: Home / Self Care | Payer: Medicare Other | Source: Other Acute Inpatient Hospital | Attending: Urology | Admitting: Urology

## 2017-07-09 DIAGNOSIS — Z8546 Personal history of malignant neoplasm of prostate: Secondary | ICD-10-CM

## 2017-07-09 DIAGNOSIS — N5201 Erectile dysfunction due to arterial insufficiency: Secondary | ICD-10-CM

## 2017-07-09 DIAGNOSIS — N393 Stress incontinence (female) (male): Secondary | ICD-10-CM | POA: Diagnosis not present

## 2017-07-09 LAB — URINALYSIS, ROUTINE W REFLEX MICROSCOPIC
BILIRUBIN URINE: NEGATIVE
Glucose, UA: 50 mg/dL — AB
Ketones, ur: NEGATIVE mg/dL
LEUKOCYTES UA: NEGATIVE
Nitrite: POSITIVE — AB
PH: 5 (ref 5.0–8.0)
Protein, ur: >=300 mg/dL — AB
SPECIFIC GRAVITY, URINE: 1.017 (ref 1.005–1.030)
Squamous Epithelial / LPF: NONE SEEN

## 2017-07-11 LAB — URINE CULTURE

## 2024-03-28 DEATH — deceased
